# Patient Record
Sex: Female | Born: 1955 | Race: Black or African American | Hispanic: No | Marital: Single | Smoking: Former smoker
Health system: Southern US, Community
[De-identification: ages and names within clinical notes are randomized; demographics above are authoritative.]

## PROBLEM LIST (undated history)

## (undated) DIAGNOSIS — I10 Essential (primary) hypertension: Secondary | ICD-10-CM

## (undated) DIAGNOSIS — E079 Disorder of thyroid, unspecified: Secondary | ICD-10-CM

## (undated) DIAGNOSIS — E785 Hyperlipidemia, unspecified: Secondary | ICD-10-CM

## (undated) DIAGNOSIS — E119 Type 2 diabetes mellitus without complications: Secondary | ICD-10-CM

## (undated) DIAGNOSIS — M1990 Unspecified osteoarthritis, unspecified site: Secondary | ICD-10-CM

## (undated) DIAGNOSIS — E0590 Thyrotoxicosis, unspecified without thyrotoxic crisis or storm: Secondary | ICD-10-CM

## (undated) HISTORY — DX: Disorder of thyroid, unspecified: E07.9

## (undated) HISTORY — DX: Essential (primary) hypertension: I10

## (undated) HISTORY — DX: Hyperlipidemia, unspecified: E78.5

## (undated) HISTORY — PX: ABDOMINAL HYSTERECTOMY: SHX81

## (undated) HISTORY — PX: PARTIAL HYSTERECTOMY: SHX80

---

## 2008-03-29 ENCOUNTER — Emergency Department: Payer: Self-pay | Admitting: Emergency Medicine

## 2008-05-28 ENCOUNTER — Ambulatory Visit: Payer: Self-pay

## 2008-11-17 ENCOUNTER — Ambulatory Visit: Payer: Self-pay | Admitting: Family Medicine

## 2008-11-21 ENCOUNTER — Ambulatory Visit: Payer: Self-pay | Admitting: Family Medicine

## 2011-09-28 DIAGNOSIS — E0500 Thyrotoxicosis with diffuse goiter without thyrotoxic crisis or storm: Secondary | ICD-10-CM | POA: Insufficient documentation

## 2011-12-27 LAB — HM MAMMOGRAPHY: HM MAMMO: NORMAL

## 2011-12-29 ENCOUNTER — Ambulatory Visit: Payer: Self-pay | Admitting: Family

## 2013-04-12 ENCOUNTER — Ambulatory Visit: Payer: Self-pay | Admitting: Family Medicine

## 2013-04-12 LAB — CBC WITH DIFFERENTIAL/PLATELET
BASOS ABS: 0 10*3/uL (ref 0.0–0.1)
BASOS PCT: 0.6 %
EOS ABS: 0.1 10*3/uL (ref 0.0–0.7)
EOS PCT: 1.6 %
HCT: 38.5 % (ref 35.0–47.0)
HGB: 12.4 g/dL (ref 12.0–16.0)
LYMPHS ABS: 2.4 10*3/uL (ref 1.0–3.6)
LYMPHS PCT: 32 %
MCH: 25.8 pg — ABNORMAL LOW (ref 26.0–34.0)
MCHC: 32.3 g/dL (ref 32.0–36.0)
MCV: 80 fL (ref 80–100)
Monocyte #: 0.6 x10 3/mm (ref 0.2–0.9)
Monocyte %: 8 %
Neutrophil #: 4.3 10*3/uL (ref 1.4–6.5)
Neutrophil %: 57.8 %
Platelet: 154 10*3/uL (ref 150–440)
RBC: 4.81 10*6/uL (ref 3.80–5.20)
RDW: 13.5 % (ref 11.5–14.5)
WBC: 7.4 10*3/uL (ref 3.6–11.0)

## 2013-04-12 LAB — URINALYSIS, COMPLETE
Bilirubin,UR: NEGATIVE
Blood: NEGATIVE
GLUCOSE, UR: NEGATIVE mg/dL (ref 0–75)
Ketone: NEGATIVE
Leukocyte Esterase: NEGATIVE
Nitrite: NEGATIVE
PH: 7 (ref 4.5–8.0)
PROTEIN: NEGATIVE
RBC,UR: NONE SEEN /HPF (ref 0–5)
SPECIFIC GRAVITY: 1.005 (ref 1.003–1.030)

## 2013-04-12 LAB — COMPREHENSIVE METABOLIC PANEL
Albumin: 3.6 g/dL (ref 3.4–5.0)
Alkaline Phosphatase: 154 U/L — ABNORMAL HIGH
Anion Gap: 9 (ref 7–16)
BUN: 9 mg/dL (ref 7–18)
Bilirubin,Total: 0.6 mg/dL (ref 0.2–1.0)
CALCIUM: 10 mg/dL (ref 8.5–10.1)
CO2: 27 mmol/L (ref 21–32)
Chloride: 104 mmol/L (ref 98–107)
Creatinine: 0.53 mg/dL — ABNORMAL LOW (ref 0.60–1.30)
EGFR (Non-African Amer.): >60
GLUCOSE: 159 mg/dL — AB (ref 65–99)
Osmolality: 281 (ref 275–301)
Potassium: 4 mmol/L (ref 3.5–5.1)
SGOT(AST): 19 U/L (ref 15–37)
SGPT (ALT): 25 U/L (ref 12–78)
SODIUM: 140 mmol/L (ref 136–145)
Total Protein: 7.7 g/dL (ref 6.4–8.2)

## 2013-04-12 LAB — T4, FREE: FREE THYROXINE: 5.28 ng/dL — AB (ref 0.76–1.46)

## 2013-04-12 LAB — TSH: Thyroid Stimulating Horm: <0.01 u[IU]/mL — ABNORMAL LOW

## 2013-11-29 LAB — TSH: TSH: 0.29 u[IU]/mL — AB (ref <=5.90)

## 2013-11-29 LAB — LIPID PANEL
Cholesterol: 212 mg/dL — AB (ref 0–200)
HDL: 34 mg/dL — AB (ref 35–70)
LDL Cholesterol: 151 mg/dL
TRIGLYCERIDES: 135 mg/dL (ref 40–160)

## 2014-03-14 LAB — BASIC METABOLIC PANEL
BUN: 15 mg/dL (ref 4–21)
CREATININE: 0.9 mg/dL (ref <=1.1)

## 2014-03-14 LAB — HEMOGLOBIN A1C: Hgb A1c MFr Bld: 7 % — AB (ref 4.0–6.0)

## 2014-04-21 ENCOUNTER — Ambulatory Visit: Payer: Self-pay | Admitting: Physician Assistant

## 2014-08-10 ENCOUNTER — Encounter: Payer: Self-pay | Admitting: Internal Medicine

## 2014-08-10 DIAGNOSIS — E0590 Thyrotoxicosis, unspecified without thyrotoxic crisis or storm: Secondary | ICD-10-CM | POA: Insufficient documentation

## 2014-08-10 DIAGNOSIS — E1165 Type 2 diabetes mellitus with hyperglycemia: Secondary | ICD-10-CM

## 2014-08-10 DIAGNOSIS — M7520 Bicipital tendinitis, unspecified shoulder: Secondary | ICD-10-CM | POA: Insufficient documentation

## 2014-08-10 DIAGNOSIS — I10 Essential (primary) hypertension: Secondary | ICD-10-CM | POA: Insufficient documentation

## 2014-08-10 DIAGNOSIS — IMO0001 Reserved for inherently not codable concepts without codable children: Secondary | ICD-10-CM | POA: Insufficient documentation

## 2014-12-05 ENCOUNTER — Encounter: Payer: Self-pay | Admitting: Internal Medicine

## 2014-12-16 ENCOUNTER — Other Ambulatory Visit: Payer: Self-pay | Admitting: Internal Medicine

## 2015-03-04 ENCOUNTER — Encounter: Payer: Self-pay | Admitting: Internal Medicine

## 2015-03-04 ENCOUNTER — Ambulatory Visit (INDEPENDENT_AMBULATORY_CARE_PROVIDER_SITE_OTHER): Payer: PRIVATE HEALTH INSURANCE | Admitting: Internal Medicine

## 2015-03-04 ENCOUNTER — Ambulatory Visit
Admission: RE | Admit: 2015-03-04 | Discharge: 2015-03-04 | Disposition: A | Discharge status: Home / Self Care | Payer: PRIVATE HEALTH INSURANCE | Source: Ambulatory Visit | Attending: Internal Medicine | Admitting: Internal Medicine

## 2015-03-04 VITALS — BP 148/88 | HR 76 | Ht 64.5 in | Wt 184.8 lb

## 2015-03-04 DIAGNOSIS — IMO0001 Reserved for inherently not codable concepts without codable children: Secondary | ICD-10-CM

## 2015-03-04 DIAGNOSIS — Z114 Encounter for screening for human immunodeficiency virus [HIV]: Secondary | ICD-10-CM

## 2015-03-04 DIAGNOSIS — Z1231 Encounter for screening mammogram for malignant neoplasm of breast: Secondary | ICD-10-CM | POA: Insufficient documentation

## 2015-03-04 DIAGNOSIS — I10 Essential (primary) hypertension: Secondary | ICD-10-CM

## 2015-03-04 DIAGNOSIS — E1165 Type 2 diabetes mellitus with hyperglycemia: Secondary | ICD-10-CM

## 2015-03-04 DIAGNOSIS — Z1159 Encounter for screening for other viral diseases: Secondary | ICD-10-CM | POA: Diagnosis not present

## 2015-03-04 DIAGNOSIS — E0590 Thyrotoxicosis, unspecified without thyrotoxic crisis or storm: Secondary | ICD-10-CM | POA: Diagnosis not present

## 2015-03-04 DIAGNOSIS — Z0000 Encounter for general adult medical examination without abnormal findings: Secondary | ICD-10-CM

## 2015-03-04 LAB — POCT URINALYSIS DIPSTICK
Bilirubin, UA: NEGATIVE
Blood, UA: NEGATIVE
Ketones, UA: NEGATIVE
LEUKOCYTES UA: NEGATIVE
NITRITE UA: NEGATIVE
PROTEIN UA: NEGATIVE
Spec Grav, UA: 1.01
UROBILINOGEN UA: 0.2
pH, UA: 5

## 2015-03-04 NOTE — Progress Notes (Signed)
Date:  03/04/2015   Name:  Sarah [name]   DOB:  1955/09/23   MRN:  [id number]   Chief Complaint: Annual Exam; Hypothyroidism; Hypertension; and Diabetes Sarah [name] is a 59 y.o. female who presents today for her Complete Annual Exam. She feels fairly well. She reports exercising none. She reports she is sleeping fairly well. She denies breast problems.  Hypertension This is a chronic problem. The current episode started more than 1 year ago. The problem is unchanged. The problem is controlled. Pertinent negatives include no chest pain, headaches, palpitations or shortness of breath. Risk factors for coronary artery disease include diabetes mellitus and dyslipidemia. Past treatments include angiotensin blockers and beta blockers. The current treatment provides significant improvement. Hypertensive end-organ damage includes a thyroid problem.  Diabetes She presents for her follow-up diabetic visit. She has type 2 diabetes mellitus. Her disease course has been fluctuating. There are no hypoglycemic associated symptoms. Pertinent negatives for hypoglycemia include no dizziness or headaches. Associated symptoms include weight loss. Pertinent negatives for diabetes include no chest pain, no fatigue, no polydipsia and no polyuria. When asked about current treatments, none were reported. She is compliant with treatment none of the time (quit metformin several months because of dizziness). When asked about meal planning, she reported none. Her breakfast blood glucose is taken between 7-8 am. Her breakfast blood glucose range is generally 140-180 mg/dl. An ACE inhibitor/angiotensin II receptor blocker is being taken. Eye exam is current.  Thyroid Problem Presents for follow-up visit. Symptoms include weight loss. Patient reports no depressed mood, diaphoresis, diarrhea, fatigue, hair loss, heat intolerance, hoarse voice, leg swelling or palpitations. The symptoms have been stable. The treatment  provided significant relief.     Review of Systems  Constitutional: Positive for weight loss and unexpected weight change (5 lbs without much effort). Negative for fever, diaphoresis and fatigue.  HENT: Negative for ear pain, hearing loss, hoarse voice, tinnitus, trouble swallowing and voice change.   Eyes: Negative for visual disturbance.  Respiratory: Negative for cough, shortness of breath and wheezing.   Cardiovascular: Negative for chest pain, palpitations and leg swelling.  Gastrointestinal: Negative for abdominal pain, diarrhea and blood in stool.  Endocrine: Negative for heat intolerance, polydipsia and polyuria.  Genitourinary: Negative for dysuria, hematuria, vaginal bleeding, vaginal discharge and vaginal pain.  Musculoskeletal: Positive for back pain.  Skin: Negative for color change and rash.  Allergic/Immunologic: Negative for environmental allergies.  Neurological: Negative for dizziness, syncope and headaches.  Hematological: Negative for adenopathy.  Psychiatric/Behavioral: Negative for sleep disturbance and dysphoric mood.    Patient Active Problem List   Diagnosis Date Noted  . Biceps tendinitis 08/10/2014  . Essential (primary) hypertension 08/10/2014  . Hyperthyroidism 08/10/2014  . Diabetes mellitus type 2, uncontrolled ([place]) 08/10/2014    Prior to Admission medications   Medication Sig Start Date End Date Taking? Authorizing Provider  irbesartan (AVAPRO) 300 MG tablet Take 300 mg by mouth daily. 08/08/14  Yes Historical Provider, MD  methimazole (TAPAZOLE) 10 MG tablet TAKE ONE TABLET BY MOUTH ONCE DAILY 12/16/14  Yes Glean Hess, MD  metoprolol tartrate (LOPRESSOR) 25 MG tablet TAKE ONE TABLET BY MOUTH TWICE DAILY FOR HIGH BLOOD PRESSURE 12/16/14  Yes Glean Hess, MD  metFORMIN (GLUCOPHAGE) 500 MG tablet Take 1 tablet by mouth 2 (two) times daily. 04/16/14   Historical Provider, MD    Allergies  Allergen Reactions  . Ace Inhibitors Nausea Only     Past Surgical History  Procedure Laterality  Date  . Partial hysterectomy      Social History  Substance Use Topics  . Smoking status: Former Research scientist (life sciences)  . Smokeless tobacco: None  . Alcohol Use: No    Medication list has been reviewed and updated.   Physical Exam  Constitutional: She is oriented to person, place, and time. She appears well-developed and well-nourished. No distress.  HENT:  Head: Normocephalic and atraumatic.  Right Ear: Tympanic membrane and ear canal normal.  Left Ear: Tympanic membrane and ear canal normal.  Nose: Right sinus exhibits no maxillary sinus tenderness. Left sinus exhibits no maxillary sinus tenderness.  Mouth/Throat: Uvula is midline and oropharynx is clear and moist.  Eyes: Conjunctivae and EOM are normal. Right eye exhibits no discharge. Left eye exhibits no discharge. No scleral icterus.  Neck: Normal range of motion. Carotid bruit is not present. No erythema present. Thyromegaly: right lobe enlarged; non tender.  Cardiovascular: Normal rate, regular rhythm, normal heart sounds and normal pulses.   Pulses:      Dorsalis pedis pulses are 2+ on the right side, and 2+ on the left side.       Posterior tibial pulses are 2+ on the right side, and 2+ on the left side.  Pulmonary/Chest: Effort normal and breath sounds normal. No respiratory distress. She has no wheezes. She has no rhonchi. Right breast exhibits no mass, no nipple discharge, no skin change and no tenderness. Left breast exhibits no mass, no nipple discharge, no skin change and no tenderness.  Abdominal: Soft. Bowel sounds are normal. There is no hepatosplenomegaly. There is no tenderness. There is no CVA tenderness.  Musculoskeletal: Normal range of motion. She exhibits no edema or tenderness.  Lymphadenopathy:    She has no cervical adenopathy.    She has no axillary adenopathy.  Neurological: She is alert and oriented to person, place, and time. She has normal reflexes. No cranial  nerve deficit or sensory deficit.  Foot exam - normal skin, pulses and sensation.  Several nails thickened and curved.  Skin: Skin is warm, dry and intact. No rash noted.  Psychiatric: She has a normal mood and affect. Her speech is normal and behavior is normal. Thought content normal.  Nursing note and vitals reviewed.   BP 148/88 mmHg  Pulse 76  Ht 5' 4.5" (1.638 m)  Wt 184 lb 12.8 oz (83.825 kg)  BMI 31.24 kg/m2  Assessment and Plan: 1. Annual physical exam Patient is encouraged to continue healthy diet and begin regular exercise 30 minutes 3 times a week - POCT urinalysis dipstick  2. Essential (primary) hypertension Slightly elevated today with previously normal readings Continue current regimen and recheck next visit - CBC with Differential/Platelet  3. Uncontrolled type 2 diabetes mellitus without complication, without long-term current use of insulin (HCC) Questionably intolerant to metformin; will check A1c and if needed resume at 250 mg twice a day - Microalbumin / creatinine urine ratio - Comprehensive metabolic panel - Hemoglobin A1c - Lipid panel  4. Need for hepatitis C screening test - Hepatitis C antibody  5. Encounter for screening for HIV - HIV antibody  6. Hyperthyroidism Asymptomatic with enlargement of the right lobe Adjust medication dose if needed - Thyroid Panel With TSH  7. Encounter for screening mammogram for breast cancer - MM DIGITAL SCREENING BILATERAL; Future   Halina Maidens, MD [facility]  03/04/2015

## 2015-03-04 NOTE — Patient Instructions (Signed)
DASH Eating Plan  DASH stands for "Dietary Approaches to Stop Hypertension." The DASH eating plan is a healthy eating plan that has been shown to reduce high blood pressure (hypertension). Additional health benefits may include reducing the risk of type 2 diabetes mellitus, heart disease, and stroke. The DASH eating plan may also help with weight loss.  WHAT DO I NEED TO KNOW ABOUT THE DASH EATING PLAN?  For the DASH eating plan, you will follow these general guidelines:  · Choose foods with a percent daily value for sodium of less than 5% (as listed on the food label).  · Use salt-free seasonings or herbs instead of table salt or sea salt.  · Check with your health care provider or pharmacist before using salt substitutes.  · Eat lower-sodium products, often labeled as "lower sodium" or "no salt added."  · Eat fresh foods.  · Eat more vegetables, fruits, and low-fat dairy products.  · Choose whole grains. Look for the word "whole" as the first word in the ingredient list.  · Choose fish and skinless chicken or turkey more often than red meat. Limit fish, poultry, and meat to 6 oz (170 g) each day.  · Limit sweets, desserts, sugars, and sugary drinks.  · Choose heart-healthy fats.  · Limit cheese to 1 oz (28 g) per day.  · Eat more home-cooked food and less restaurant, buffet, and fast food.  · Limit fried foods.  · Cook foods using methods other than frying.  · Limit canned vegetables. If you do use them, rinse them well to decrease the sodium.  · When eating at a restaurant, ask that your food be prepared with less salt, or no salt if possible.  WHAT FOODS CAN I EAT?  Seek help from a dietitian for individual calorie needs.  Grains  Whole grain or whole wheat bread. Brown rice. Whole grain or whole wheat pasta. Quinoa, bulgur, and whole grain cereals. Low-sodium cereals. Corn or whole wheat flour tortillas. Whole grain cornbread. Whole grain crackers. Low-sodium crackers.  Vegetables  Fresh or frozen vegetables  (raw, steamed, roasted, or grilled). Low-sodium or reduced-sodium tomato and vegetable juices. Low-sodium or reduced-sodium tomato sauce and paste. Low-sodium or reduced-sodium canned vegetables.   Fruits  All fresh, canned (in natural juice), or frozen fruits.  Meat and Other Protein Products  Ground beef (85% or leaner), grass-fed beef, or beef trimmed of fat. Skinless chicken or turkey. Ground chicken or turkey. Pork trimmed of fat. All fish and seafood. Eggs. Dried beans, peas, or lentils. Unsalted nuts and seeds. Unsalted canned beans.  Dairy  Low-fat dairy products, such as skim or 1% milk, 2% or reduced-fat cheeses, low-fat ricotta or cottage cheese, or plain low-fat yogurt. Low-sodium or reduced-sodium cheeses.  Fats and Oils  Tub margarines without trans fats. Light or reduced-fat mayonnaise and salad dressings (reduced sodium). Avocado. Safflower, olive, or canola oils. Natural peanut or almond butter.  Other  Unsalted popcorn and pretzels.  The items listed above may not be a complete list of recommended foods or beverages. Contact your dietitian for more options.  WHAT FOODS ARE NOT RECOMMENDED?  Grains  White bread. White pasta. White rice. Refined cornbread. Bagels and croissants. Crackers that contain trans fat.  Vegetables  Creamed or fried vegetables. Vegetables in a cheese sauce. Regular canned vegetables. Regular canned tomato sauce and paste. Regular tomato and vegetable juices.  Fruits  Dried fruits. Canned fruit in light or heavy syrup. Fruit juice.  Meat and Other Protein   Products  Fatty cuts of meat. Ribs, chicken wings, bacon, sausage, bologna, salami, chitterlings, fatback, hot dogs, bratwurst, and packaged luncheon meats. Salted nuts and seeds. Canned beans with salt.  Dairy  Whole or 2% milk, cream, half-and-half, and cream cheese. Whole-fat or sweetened yogurt. Full-fat cheeses or blue cheese. Nondairy creamers and whipped toppings. Processed cheese, cheese spreads, or cheese  curds.  Condiments  Onion and garlic salt, seasoned salt, table salt, and sea salt. Canned and packaged gravies. Worcestershire sauce. Tartar sauce. Barbecue sauce. Teriyaki sauce. Soy sauce, including reduced sodium. Steak sauce. Fish sauce. Oyster sauce. Cocktail sauce. Horseradish. Ketchup and mustard. Meat flavorings and tenderizers. Bouillon cubes. Hot sauce. Tabasco sauce. Marinades. Taco seasonings. Relishes.  Fats and Oils  Butter, stick margarine, lard, shortening, ghee, and bacon fat. Coconut, palm kernel, or palm oils. Regular salad dressings.  Other  Pickles and olives. Salted popcorn and pretzels.  The items listed above may not be a complete list of foods and beverages to avoid. Contact your dietitian for more information.  WHERE CAN I FIND MORE INFORMATION?  National Heart, Lung, and Blood Institute: www.nhlbi.nih.gov/health/health-topics/topics/dash/     This information is not intended to replace advice given to you by your health care provider. Make sure you discuss any questions you have with your health care provider.     Document Released: 03/03/2011 Document Revised: 04/04/2014 Document Reviewed: 01/16/2013  Elsevier Interactive Patient Education ©2016 Elsevier Inc.

## 2015-03-05 ENCOUNTER — Other Ambulatory Visit: Payer: Self-pay | Admitting: Internal Medicine

## 2015-03-05 DIAGNOSIS — E1169 Type 2 diabetes mellitus with other specified complication: Secondary | ICD-10-CM | POA: Insufficient documentation

## 2015-03-05 DIAGNOSIS — E785 Hyperlipidemia, unspecified: Principal | ICD-10-CM

## 2015-03-05 LAB — LIPID PANEL
CHOL/HDL RATIO: 7.3 ratio — AB (ref 0.0–4.4)
CHOLESTEROL TOTAL: 261 mg/dL — AB (ref 100–199)
HDL: 36 mg/dL — ABNORMAL LOW (ref >39)
LDL CALC: 178 mg/dL — AB (ref 0–99)
TRIGLYCERIDES: 236 mg/dL — AB (ref 0–149)
VLDL CHOLESTEROL CAL: 47 mg/dL — AB (ref 5–40)

## 2015-03-05 LAB — CBC WITH DIFFERENTIAL/PLATELET
BASOS: 1 %
Basophils Absolute: 0.1 10*3/uL (ref 0.0–0.2)
EOS (ABSOLUTE): 0.2 10*3/uL (ref 0.0–0.4)
EOS: 2 %
HEMOGLOBIN: 14.5 g/dL (ref 11.1–15.9)
Hematocrit: 44.7 % (ref 34.0–46.6)
IMMATURE GRANS (ABS): 0 10*3/uL (ref 0.0–0.1)
Immature Granulocytes: 0 %
LYMPHS: 38 %
Lymphocytes Absolute: 3.9 10*3/uL — ABNORMAL HIGH (ref 0.7–3.1)
MCH: 28.8 pg (ref 26.6–33.0)
MCHC: 32.4 g/dL (ref 31.5–35.7)
MCV: 89 fL (ref 79–97)
Monocytes Absolute: 0.4 10*3/uL (ref 0.1–0.9)
Monocytes: 4 %
Neutrophils Absolute: 5.7 10*3/uL (ref 1.4–7.0)
Neutrophils: 55 %
Platelets: 180 10*3/uL (ref 150–379)
RBC: 5.03 x10E6/uL (ref 3.77–5.28)
RDW: 13.5 % (ref 12.3–15.4)
WBC: 10.3 10*3/uL (ref 3.4–10.8)

## 2015-03-05 LAB — COMPREHENSIVE METABOLIC PANEL
A/G RATIO: 1.5 (ref 1.1–2.5)
ALBUMIN: 4.8 g/dL (ref 3.5–5.5)
ALT: 15 IU/L (ref 0–32)
AST: 18 IU/L (ref 0–40)
Alkaline Phosphatase: 128 IU/L — ABNORMAL HIGH (ref 39–117)
BUN / CREAT RATIO: 16 (ref 9–23)
BUN: 13 mg/dL (ref 6–24)
Bilirubin Total: 0.6 mg/dL (ref 0.0–1.2)
CALCIUM: 10 mg/dL (ref 8.7–10.2)
CO2: 24 mmol/L (ref 18–29)
Chloride: 99 mmol/L (ref 97–106)
Creatinine, Ser: 0.82 mg/dL (ref 0.57–1.00)
GFR, EST AFRICAN AMERICAN: 91 mL/min/{1.73_m2} (ref >59)
GFR, EST NON AFRICAN AMERICAN: 79 mL/min/{1.73_m2} (ref >59)
Globulin, Total: 3.2 g/dL (ref 1.5–4.5)
Glucose: 362 mg/dL — ABNORMAL HIGH (ref 65–99)
Potassium: 4.2 mmol/L (ref 3.5–5.2)
Sodium: 140 mmol/L (ref 136–144)
TOTAL PROTEIN: 8 g/dL (ref 6.0–8.5)

## 2015-03-05 LAB — HIV ANTIBODY (ROUTINE TESTING W REFLEX): HIV Screen 4th Generation wRfx: NONREACTIVE

## 2015-03-05 LAB — HEPATITIS C ANTIBODY: Hep C Virus Ab: <0.1 s/co ratio (ref 0.0–0.9)

## 2015-03-05 LAB — MICROALBUMIN / CREATININE URINE RATIO
Creatinine, Urine: 71.5 mg/dL
MICROALB/CREAT RATIO: 32.2 mg/g creat — ABNORMAL HIGH (ref 0.0–30.0)
Microalbumin, Urine: 23 ug/mL

## 2015-03-05 LAB — THYROID PANEL WITH TSH
FREE THYROXINE INDEX: 1.6 (ref 1.2–4.9)
T3 Uptake Ratio: 25 % (ref 24–39)
T4, Total: 6.5 ug/dL (ref 4.5–12.0)
TSH: 2.87 u[IU]/mL (ref 0.450–4.500)

## 2015-03-05 LAB — HEMOGLOBIN A1C
Est. average glucose Bld gHb Est-mCnc: 332 mg/dL
Hgb A1c MFr Bld: 13.2 % — ABNORMAL HIGH (ref 4.8–5.6)

## 2015-03-05 MED ORDER — METFORMIN HCL 500 MG PO TABS: 250.0000 mg | ORAL_TABLET | Freq: Two times a day (BID) | ORAL | Status: DC

## 2015-03-20 ENCOUNTER — Other Ambulatory Visit: Payer: Self-pay | Admitting: Internal Medicine

## 2015-04-01 ENCOUNTER — Other Ambulatory Visit: Payer: Self-pay | Admitting: Internal Medicine

## 2015-07-03 ENCOUNTER — Encounter: Payer: Self-pay | Admitting: Internal Medicine

## 2015-07-03 ENCOUNTER — Ambulatory Visit (INDEPENDENT_AMBULATORY_CARE_PROVIDER_SITE_OTHER): Payer: PRIVATE HEALTH INSURANCE | Admitting: Internal Medicine

## 2015-07-03 VITALS — BP 144/84 | HR 68 | Ht 64.5 in | Wt 184.4 lb

## 2015-07-03 DIAGNOSIS — I10 Essential (primary) hypertension: Secondary | ICD-10-CM | POA: Diagnosis not present

## 2015-07-03 DIAGNOSIS — E1165 Type 2 diabetes mellitus with hyperglycemia: Secondary | ICD-10-CM

## 2015-07-03 DIAGNOSIS — IMO0001 Reserved for inherently not codable concepts without codable children: Secondary | ICD-10-CM

## 2015-07-03 DIAGNOSIS — J3089 Other allergic rhinitis: Secondary | ICD-10-CM | POA: Diagnosis not present

## 2015-07-03 NOTE — Progress Notes (Signed)
Date:  07/03/2015   Name:  Sarah [name]   DOB:  1955-09-05   MRN:  [id number]   Chief Complaint: Diabetes and Hypertension Diabetes She presents for her follow-up diabetic visit. She has type 2 diabetes mellitus. Her disease course has been worsening. Pertinent negatives for hypoglycemia include no headaches. Pertinent negatives for diabetes include no chest pain, no fatigue, no polydipsia and no polyuria. Symptoms are worsening (restarted low dose metformin last visit). Her breakfast blood glucose is taken between 7-8 am. Her breakfast blood glucose range is generally 140-180 mg/dl.  Hypertension This is a chronic problem. The current episode started more than 1 year ago. The problem is unchanged. The problem is controlled. Pertinent negatives include no chest pain, headaches or shortness of breath. Past treatments include angiotensin blockers and beta blockers. The current treatment provides moderate improvement.    Lab Results  Component Value Date   HGBA1C 13.2* 03/04/2015     Review of Systems  Constitutional: Negative for chills and fatigue.  HENT: Positive for congestion, rhinorrhea and sinus pressure.   Eyes: Positive for itching.  Respiratory: Negative for cough, chest tightness and shortness of breath.   Cardiovascular: Negative for chest pain and leg swelling.  Endocrine: Negative for polydipsia and polyuria.  Musculoskeletal: Negative for arthralgias.  Allergic/Immunologic: Positive for environmental allergies.  Neurological: Negative for headaches.  Psychiatric/Behavioral: Negative for sleep disturbance and dysphoric mood.    Patient Active Problem List   Diagnosis Date Noted  . Hyperlipidemia associated with type 2 diabetes mellitus ([place]) 03/05/2015  . Biceps tendinitis 08/10/2014  . Essential (primary) hypertension 08/10/2014  . Hyperthyroidism 08/10/2014  . Diabetes mellitus type 2, uncontrolled ([place]) 08/10/2014    Prior to Admission medications     Medication Sig Start Date End Date Taking? Authorizing Provider  irbesartan (AVAPRO) 300 MG tablet Take 300 mg by mouth daily. 08/08/14   Historical Provider, MD  metFORMIN (GLUCOPHAGE) 500 MG tablet Take 0.5 tablets (250 mg total) by mouth 2 (two) times daily. 03/05/15   Glean Hess, MD  methimazole (TAPAZOLE) 10 MG tablet TAKE ONE TABLET BY MOUTH ONCE DAILY 03/20/15   Glean Hess, MD  metoprolol tartrate (LOPRESSOR) 25 MG tablet TAKE ONE TABLET BY MOUTH TWICE DAILY FOR BLOOD PRESSURE 04/01/15   Glean Hess, MD    Allergies  Allergen Reactions  . Ace Inhibitors Nausea Only    Past Surgical History  Procedure Laterality Date  . Partial hysterectomy    . Abdominal hysterectomy      Social History  Substance Use Topics  . Smoking status: Former Research scientist (life sciences)  . Smokeless tobacco: None  . Alcohol Use: No     Medication list has been reviewed and updated.   Physical Exam  Constitutional: She is oriented to person, place, and time. She appears well-developed. No distress.  HENT:  Head: Normocephalic and atraumatic.  Eyes:  Excessive tearing - otherwise clear  Neck: Thyroid mass (right sided enlargement stable, non tender) present.  Cardiovascular: Normal rate, regular rhythm, normal heart sounds and intact distal pulses.   Pulmonary/Chest: Effort normal and breath sounds normal. No respiratory distress.  Musculoskeletal: Normal range of motion.  Neurological: She is alert and oriented to person, place, and time.  Skin: Skin is warm and dry. No rash noted.  Psychiatric: She has a normal mood and affect. Her behavior is normal. Thought content normal.  Nursing note and vitals reviewed.   BP 164/88 mmHg  Pulse 68  Ht 5' 4.5" (  1.638 m)  Wt 184 lb 6.4 oz (83.643 kg)  BMI 31.17 kg/m2  Assessment and Plan: 1. Essential (primary) hypertension Fair control - pt unable to afford irbesartan Will recheck next visit and consider other medication  2. Uncontrolled type 2  diabetes mellitus without complication, without long-term current use of insulin (HCC) Continue metformin 250 mg bid Will probably need to add another agent - Hemoglobin A1c  3. Environmental and seasonal allergies claritin 10 mg qd prn   Halina Maidens, MD [facility]  07/03/2015

## 2015-07-03 NOTE — Patient Instructions (Signed)
Try [employer] brand Equate - Loratidine 10 mg - take once a day

## 2015-07-04 LAB — HEMOGLOBIN A1C
Est. average glucose Bld gHb Est-mCnc: 298 mg/dL
HEMOGLOBIN A1C: 12 % — AB (ref 4.8–5.6)

## 2015-07-06 ENCOUNTER — Telehealth: Payer: Self-pay

## 2015-07-06 NOTE — Telephone Encounter (Signed)
Tried calling patient and no answer. Will try again later.  

## 2015-07-06 NOTE — Telephone Encounter (Signed)
-----   Message from Glean Hess, MD sent at 07/06/2015  8:07 AM EDT ----- Diabetes is only slightly better.  Need to add another medication - Farxiga 10 mg.  I have a savings card for you to pick up.

## 2015-07-08 NOTE — Telephone Encounter (Signed)
Please review or send to Dr. Sharm[employer] nurse. Unable to contact before I left. Thanks!

## 2015-07-08 NOTE — Telephone Encounter (Signed)
Tried calling patient and no answer. Will try again later.  

## 2015-07-14 ENCOUNTER — Other Ambulatory Visit: Payer: Self-pay | Admitting: Internal Medicine

## 2015-07-14 MED ORDER — DAPAGLIFLOZIN PROPANEDIOL 10 MG PO TABS: 10.0000 mg | ORAL_TABLET | Freq: Every day | ORAL | Status: DC

## 2015-07-14 MED ORDER — METFORMIN HCL 500 MG PO TABS: 250.0000 mg | ORAL_TABLET | Freq: Two times a day (BID) | ORAL | Status: DC

## 2015-07-14 NOTE — Telephone Encounter (Signed)
Spoke with patient. Patient advised of all results and verbalized understanding. Will call back with any future questions or concerns. MAH  

## 2015-07-14 NOTE — Telephone Encounter (Signed)
Tried calling patient.  No answer.  Will try again

## 2015-07-27 ENCOUNTER — Other Ambulatory Visit: Payer: Self-pay | Admitting: Internal Medicine

## 2015-11-05 ENCOUNTER — Encounter: Payer: Self-pay | Admitting: Internal Medicine

## 2015-11-05 ENCOUNTER — Ambulatory Visit (INDEPENDENT_AMBULATORY_CARE_PROVIDER_SITE_OTHER): Payer: PRIVATE HEALTH INSURANCE | Admitting: Internal Medicine

## 2015-11-05 VITALS — BP 136/84 | HR 73 | Resp 16 | Ht 64.5 in | Wt 182.0 lb

## 2015-11-05 DIAGNOSIS — L84 Corns and callosities: Secondary | ICD-10-CM

## 2015-11-05 DIAGNOSIS — I10 Essential (primary) hypertension: Secondary | ICD-10-CM

## 2015-11-05 DIAGNOSIS — E1165 Type 2 diabetes mellitus with hyperglycemia: Secondary | ICD-10-CM | POA: Diagnosis not present

## 2015-11-05 DIAGNOSIS — IMO0001 Reserved for inherently not codable concepts without codable children: Secondary | ICD-10-CM

## 2015-11-05 MED ORDER — GLIMEPIRIDE 2 MG PO TABS: 2.0000 mg | ORAL_TABLET | Freq: Every day | ORAL | 3 refills | Status: DC

## 2015-11-05 NOTE — Patient Instructions (Signed)
DASH Eating Plan  DASH stands for "Dietary Approaches to Stop Hypertension." The DASH eating plan is a healthy eating plan that has been shown to reduce high blood pressure (hypertension). Additional health benefits may include reducing the risk of type 2 diabetes mellitus, heart disease, and stroke. The DASH eating plan may also help with weight loss.  WHAT DO I NEED TO KNOW ABOUT THE DASH EATING PLAN?  For the DASH eating plan, you will follow these general guidelines:  · Choose foods with a percent daily value for sodium of less than 5% (as listed on the food label).  · Use salt-free seasonings or herbs instead of table salt or sea salt.  · Check with your health care provider or pharmacist before using salt substitutes.  · Eat lower-sodium products, often labeled as "lower sodium" or "no salt added."  · Eat fresh foods.  · Eat more vegetables, fruits, and low-fat dairy products.  · Choose whole grains. Look for the word "whole" as the first word in the ingredient list.  · Choose fish and skinless chicken or turkey more often than red meat. Limit fish, poultry, and meat to 6 oz (170 g) each day.  · Limit sweets, desserts, sugars, and sugary drinks.  · Choose heart-healthy fats.  · Limit cheese to 1 oz (28 g) per day.  · Eat more home-cooked food and less restaurant, buffet, and fast food.  · Limit fried foods.  · Cook foods using methods other than frying.  · Limit canned vegetables. If you do use them, rinse them well to decrease the sodium.  · When eating at a restaurant, ask that your food be prepared with less salt, or no salt if possible.  WHAT FOODS CAN I EAT?  Seek help from a dietitian for individual calorie needs.  Grains  Whole grain or whole wheat bread. Brown rice. Whole grain or whole wheat pasta. Quinoa, bulgur, and whole grain cereals. Low-sodium cereals. Corn or whole wheat flour tortillas. Whole grain cornbread. Whole grain crackers. Low-sodium crackers.  Vegetables  Fresh or frozen vegetables  (raw, steamed, roasted, or grilled). Low-sodium or reduced-sodium tomato and vegetable juices. Low-sodium or reduced-sodium tomato sauce and paste. Low-sodium or reduced-sodium canned vegetables.   Fruits  All fresh, canned (in natural juice), or frozen fruits.  Meat and Other Protein Products  Ground beef (85% or leaner), grass-fed beef, or beef trimmed of fat. Skinless chicken or turkey. Ground chicken or turkey. Pork trimmed of fat. All fish and seafood. Eggs. Dried beans, peas, or lentils. Unsalted nuts and seeds. Unsalted canned beans.  Dairy  Low-fat dairy products, such as skim or 1% milk, 2% or reduced-fat cheeses, low-fat ricotta or cottage cheese, or plain low-fat yogurt. Low-sodium or reduced-sodium cheeses.  Fats and Oils  Tub margarines without trans fats. Light or reduced-fat mayonnaise and salad dressings (reduced sodium). Avocado. Safflower, olive, or canola oils. Natural peanut or almond butter.  Other  Unsalted popcorn and pretzels.  The items listed above may not be a complete list of recommended foods or beverages. Contact your dietitian for more options.  WHAT FOODS ARE NOT RECOMMENDED?  Grains  White bread. White pasta. White rice. Refined cornbread. Bagels and croissants. Crackers that contain trans fat.  Vegetables  Creamed or fried vegetables. Vegetables in a cheese sauce. Regular canned vegetables. Regular canned tomato sauce and paste. Regular tomato and vegetable juices.  Fruits  Dried fruits. Canned fruit in light or heavy syrup. Fruit juice.  Meat and Other Protein   Products  Fatty cuts of meat. Ribs, chicken wings, bacon, sausage, bologna, salami, chitterlings, fatback, hot dogs, bratwurst, and packaged luncheon meats. Salted nuts and seeds. Canned beans with salt.  Dairy  Whole or 2% milk, cream, half-and-half, and cream cheese. Whole-fat or sweetened yogurt. Full-fat cheeses or blue cheese. Nondairy creamers and whipped toppings. Processed cheese, cheese spreads, or cheese  curds.  Condiments  Onion and garlic salt, seasoned salt, table salt, and sea salt. Canned and packaged gravies. Worcestershire sauce. Tartar sauce. Barbecue sauce. Teriyaki sauce. Soy sauce, including reduced sodium. Steak sauce. Fish sauce. Oyster sauce. Cocktail sauce. Horseradish. Ketchup and mustard. Meat flavorings and tenderizers. Bouillon cubes. Hot sauce. Tabasco sauce. Marinades. Taco seasonings. Relishes.  Fats and Oils  Butter, stick margarine, lard, shortening, ghee, and bacon fat. Coconut, palm kernel, or palm oils. Regular salad dressings.  Other  Pickles and olives. Salted popcorn and pretzels.  The items listed above may not be a complete list of foods and beverages to avoid. Contact your dietitian for more information.  WHERE CAN I FIND MORE INFORMATION?  National Heart, Lung, and Blood Institute: www.nhlbi.nih.gov/health/health-topics/topics/dash/     This information is not intended to replace advice given to you by your health care provider. Make sure you discuss any questions you have with your health care provider.     Document Released: 03/03/2011 Document Revised: 04/04/2014 Document Reviewed: 01/16/2013  Elsevier Interactive Patient Education ©2016 Elsevier Inc.

## 2015-11-05 NOTE — Progress Notes (Signed)
Date:  11/05/2015   Name:  Sarah [name]   DOB:  [birth year]   MRN:  [id number]   Chief Complaint: Hypertension and Toe Pain (Left foot bump on 4 digit toe) Hypertension  This is a chronic problem. The current episode started more than 1 year ago. The problem has been waxing and waning since onset. The problem is controlled. Pertinent negatives include no anxiety, chest pain, headaches, palpitations or shortness of breath. Risk factors for coronary artery disease include diabetes mellitus and dyslipidemia. Past treatments include beta blockers (no coverage for ARBs and allergic to ACEI).  Toe Pain   The incident occurred more than 1 week ago. There was no injury mechanism. Pain location: between 4th and 5th toes on left foot. Pertinent negatives include no numbness.  Diabetes  She presents for her follow-up diabetic visit. She has type 2 diabetes mellitus. Pertinent negatives for hypoglycemia include no headaches or tremors. Pertinent negatives for diabetes include no chest pain, no fatigue, no foot ulcerations, no polydipsia, no polyuria, no visual change and no weight loss. Current diabetic treatments: metformin; yeast infection with SGLT-2. She is compliant with treatment all of the time. Her weight is stable. She rarely (planning to get serious about excercise and weight loss) participates in exercise. An ACE inhibitor/angiotensin II receptor blocker is contraindicated.   Lab Results  Component Value Date   HGBA1C 12.0 (H) 07/03/2015     Review of Systems  Constitutional: Negative for appetite change, fatigue, fever, unexpected weight change and weight loss.  HENT: Negative for tinnitus and trouble swallowing.   Eyes: Negative for visual disturbance.  Respiratory: Negative for cough, chest tightness and shortness of breath.   Cardiovascular: Negative for chest pain, palpitations and leg swelling.  Gastrointestinal: Negative for abdominal pain.  Endocrine: Negative for  polydipsia and polyuria.  Genitourinary: Negative for dysuria and hematuria.  Musculoskeletal: Negative for arthralgias.  Neurological: Negative for tremors, numbness and headaches.  Psychiatric/Behavioral: Negative for dysphoric mood.    Patient Active Problem List   Diagnosis Date Noted  . Hyperlipidemia associated with type 2 diabetes mellitus ([place]) 03/05/2015  . Biceps tendinitis 08/10/2014  . Essential (primary) hypertension 08/10/2014  . Hyperthyroidism 08/10/2014  . Diabetes mellitus type 2, uncontrolled ([place]) 08/10/2014    Prior to Admission medications   Medication Sig Start Date End Date Taking? Authorizing Provider  irbesartan (AVAPRO) 300 MG tablet Take 300 mg by mouth daily. Reported on 07/03/2015 08/08/14  Yes Historical Provider, MD  loratadine (ALLERGY) 10 MG tablet Take 10 mg by mouth daily.   Yes Historical Provider, MD  metFORMIN (GLUCOPHAGE) 500 MG tablet Take 0.5 tablets (250 mg total) by mouth 2 (two) times daily. 07/14/15  Yes Glean Hess, MD  methimazole (TAPAZOLE) 10 MG tablet TAKE ONE TABLET BY MOUTH ONCE DAILY 07/27/15  Yes Glean Hess, MD  metoprolol tartrate (LOPRESSOR) 25 MG tablet TAKE ONE TABLET BY MOUTH TWICE DAILY FOR BLOOD PRESSURE 04/01/15  Yes Glean Hess, MD  dapagliflozin propanediol (FARXIGA) 10 MG TABS tablet Take 10 mg by mouth daily. Patient not taking: Reported on 11/05/2015 07/14/15   Glean Hess, MD    Allergies  Allergen Reactions  . Ace Inhibitors Nausea Only  . Wilder Glade [Dapagliflozin]     Vaginitis     Past Surgical History:  Procedure Laterality Date  . ABDOMINAL HYSTERECTOMY    . PARTIAL HYSTERECTOMY      Social History  Substance Use Topics  . Smoking status: Former Smoker  Types: E-cigarettes  . Smokeless tobacco: Never Used  . Alcohol use No     Medication list has been reviewed and updated.   Physical Exam  Constitutional: She is oriented to person, place, and time. She appears well-developed. No  distress.  HENT:  Head: Normocephalic and atraumatic.  Cardiovascular: Normal rate, regular rhythm and normal heart sounds.   Pulmonary/Chest: Effort normal and breath sounds normal. No respiratory distress.  Musculoskeletal: Normal range of motion. She exhibits no edema.  Neurological: She is alert and oriented to person, place, and time.  Skin: Skin is warm and dry. No rash noted.  3 mm callus/corn between toes - tender to touch; no drainage  Psychiatric: She has a normal mood and affect. Her behavior is normal. Thought content normal.  Nursing note and vitals reviewed.   BP (!) 143/78 (BP Location: Right Arm, Patient Position: Sitting, Cuff Size: Normal)   Pulse 73   Resp 16   Ht 5' 4.5" (1.638 m)   Wt 182 lb (82.6 kg)   SpO2 100%   BMI 30.76 kg/m   Assessment and Plan: 1. Corn or callus - Ambulatory referral to Podiatry  2. Essential (primary) hypertension Improved on singe agent  3. Uncontrolled type 2 diabetes mellitus without complication, without long-term current use of insulin ([place]) Discussed need to better control - will add the most cost effective medication but need to consider insulin - Hemoglobin A1c - glimepiride (AMARYL) 2 MG tablet; Take 1 tablet (2 mg total) by mouth daily before breakfast.  Dispense: 30 tablet; Refill: [place], MD [facility]  11/05/2015

## 2015-11-06 LAB — HEMOGLOBIN A1C
Est. average glucose Bld gHb Est-mCnc: 266 mg/dL
HEMOGLOBIN A1C: 10.9 % — AB (ref 4.8–5.6)

## 2016-01-25 ENCOUNTER — Other Ambulatory Visit: Payer: Self-pay | Admitting: Internal Medicine

## 2016-01-25 NOTE — Telephone Encounter (Signed)
Thyroid med refill to [facility]

## 2016-03-03 ENCOUNTER — Encounter: Payer: Self-pay | Admitting: Internal Medicine

## 2016-03-04 ENCOUNTER — Other Ambulatory Visit
Admission: RE | Admit: 2016-03-04 | Discharge: 2016-03-04 | Disposition: A | Discharge status: Home / Self Care | Payer: No Typology Code available for payment source | Source: Ambulatory Visit | Attending: Internal Medicine | Admitting: Internal Medicine

## 2016-03-04 ENCOUNTER — Encounter: Payer: Self-pay | Admitting: Internal Medicine

## 2016-03-04 ENCOUNTER — Other Ambulatory Visit: Payer: Self-pay | Admitting: Internal Medicine

## 2016-03-04 ENCOUNTER — Other Ambulatory Visit: Payer: Self-pay

## 2016-03-04 ENCOUNTER — Ambulatory Visit (INDEPENDENT_AMBULATORY_CARE_PROVIDER_SITE_OTHER): Payer: PRIVATE HEALTH INSURANCE | Admitting: Internal Medicine

## 2016-03-04 VITALS — BP 122/82 | HR 74 | Resp 16 | Ht 64.5 in | Wt 186.0 lb

## 2016-03-04 DIAGNOSIS — E1165 Type 2 diabetes mellitus with hyperglycemia: Secondary | ICD-10-CM | POA: Diagnosis not present

## 2016-03-04 DIAGNOSIS — Z1211 Encounter for screening for malignant neoplasm of colon: Secondary | ICD-10-CM

## 2016-03-04 DIAGNOSIS — Z0000 Encounter for general adult medical examination without abnormal findings: Secondary | ICD-10-CM | POA: Diagnosis not present

## 2016-03-04 DIAGNOSIS — E0590 Thyrotoxicosis, unspecified without thyrotoxic crisis or storm: Secondary | ICD-10-CM

## 2016-03-04 DIAGNOSIS — E1169 Type 2 diabetes mellitus with other specified complication: Secondary | ICD-10-CM | POA: Diagnosis not present

## 2016-03-04 DIAGNOSIS — Z1231 Encounter for screening mammogram for malignant neoplasm of breast: Secondary | ICD-10-CM | POA: Diagnosis not present

## 2016-03-04 DIAGNOSIS — E785 Hyperlipidemia, unspecified: Secondary | ICD-10-CM | POA: Diagnosis not present

## 2016-03-04 DIAGNOSIS — I10 Essential (primary) hypertension: Secondary | ICD-10-CM | POA: Diagnosis not present

## 2016-03-04 DIAGNOSIS — E119 Type 2 diabetes mellitus without complications: Secondary | ICD-10-CM | POA: Insufficient documentation

## 2016-03-04 DIAGNOSIS — Z1239 Encounter for other screening for malignant neoplasm of breast: Secondary | ICD-10-CM

## 2016-03-04 DIAGNOSIS — IMO0001 Reserved for inherently not codable concepts without codable children: Secondary | ICD-10-CM

## 2016-03-04 LAB — POCT URINALYSIS DIPSTICK
Bilirubin, UA: NEGATIVE
Blood, UA: NEGATIVE
GLUCOSE UA: NEGATIVE
Ketones, UA: NEGATIVE
Leukocytes, UA: NEGATIVE
NITRITE UA: NEGATIVE
Protein, UA: NEGATIVE
Spec Grav, UA: 1.01
UROBILINOGEN UA: 0.2
pH, UA: 6

## 2016-03-04 LAB — COMPREHENSIVE METABOLIC PANEL
ALK PHOS: 74 U/L (ref 38–126)
ALT: 13 U/L — ABNORMAL LOW (ref 14–54)
AST: 16 U/L (ref 15–41)
Albumin: 4.2 g/dL (ref 3.5–5.0)
Anion gap: 7 (ref 5–15)
BILIRUBIN TOTAL: 0.6 mg/dL (ref 0.3–1.2)
BUN: 18 mg/dL (ref 6–20)
CALCIUM: 9 mg/dL (ref 8.9–10.3)
CO2: 25 mmol/L (ref 22–32)
CREATININE: 0.83 mg/dL (ref 0.44–1.00)
Chloride: 107 mmol/L (ref 101–111)
GFR calc Af Amer: >60 mL/min (ref >60)
Glucose, Bld: 122 mg/dL — ABNORMAL HIGH (ref 65–99)
POTASSIUM: 3.8 mmol/L (ref 3.5–5.1)
Sodium: 139 mmol/L (ref 135–145)
TOTAL PROTEIN: 7.6 g/dL (ref 6.5–8.1)

## 2016-03-04 LAB — LIPID PANEL
CHOL/HDL RATIO: 6.6 ratio
CHOLESTEROL: 223 mg/dL — AB (ref 0–200)
HDL: 34 mg/dL — ABNORMAL LOW (ref >40)
LDL Cholesterol: 159 mg/dL — ABNORMAL HIGH (ref 0–99)
Triglycerides: 151 mg/dL — ABNORMAL HIGH (ref <150)
VLDL: 30 mg/dL (ref 0–40)

## 2016-03-04 LAB — CBC WITH DIFFERENTIAL/PLATELET
BASOS ABS: 0.1 10*3/uL (ref 0–0.1)
Basophils Relative: 1 %
Eosinophils Absolute: 0.3 10*3/uL (ref 0–0.7)
Eosinophils Relative: 3 %
HEMATOCRIT: 38.4 % (ref 35.0–47.0)
HEMOGLOBIN: 12.7 g/dL (ref 12.0–16.0)
LYMPHS PCT: 31 %
Lymphs Abs: 2.8 10*3/uL (ref 1.0–3.6)
MCH: 28.8 pg (ref 26.0–34.0)
MCHC: 33 g/dL (ref 32.0–36.0)
MCV: 87.1 fL (ref 80.0–100.0)
MONO ABS: 0.5 10*3/uL (ref 0.2–0.9)
MONOS PCT: 6 %
NEUTROS ABS: 5.4 10*3/uL (ref 1.4–6.5)
Neutrophils Relative %: 59 %
Platelets: 158 10*3/uL (ref 150–440)
RBC: 4.41 MIL/uL (ref 3.80–5.20)
RDW: 13.2 % (ref 11.5–14.5)
WBC: 9.2 10*3/uL (ref 3.6–11.0)

## 2016-03-04 LAB — TSH: TSH: 4.126 u[IU]/mL (ref 0.350–4.500)

## 2016-03-04 MED ORDER — METOPROLOL TARTRATE 25 MG PO TABS: 25.0000 mg | ORAL_TABLET | Freq: Two times a day (BID) | ORAL | 12 refills | Status: DC

## 2016-03-04 MED ORDER — GLIMEPIRIDE 2 MG PO TABS: 2.0000 mg | ORAL_TABLET | Freq: Every day | ORAL | 12 refills | Status: DC

## 2016-03-04 MED ORDER — METFORMIN HCL 500 MG PO TABS: 250.0000 mg | ORAL_TABLET | Freq: Two times a day (BID) | ORAL | 12 refills | Status: DC

## 2016-03-04 NOTE — Progress Notes (Signed)
Date:  03/04/2016   Name:  Sarah [name]   DOB:  30-Jan-1956   MRN:  [id number]   Chief Complaint: Annual Exam and Diabetes (refills. BS 120-140) Sarah [name] is a 60 y.o. female who presents today for her Complete Annual Exam. She feels well. She reports exercising on the weekends. She reports she is sleeping well. She is due for a mammogram.  She had Cologuard about 4 years ago - negative.  She does not have anyone to drive her for a colonoscopy.  Diabetes  Pertinent negatives for hypoglycemia include no dizziness, headaches, nervousness/anxiousness or tremors. Pertinent negatives for diabetes include no chest pain, no fatigue, no polydipsia and no polyuria.  Hypertension  Pertinent negatives include no chest pain, headaches, palpitations or shortness of breath. Hypertensive end-organ damage includes a thyroid problem.  Thyroid Problem  Presents for follow-up visit. Patient reports no anxiety, constipation, diarrhea, fatigue, palpitations or tremors. The symptoms have been stable.    Review of Systems  Constitutional: Negative for appetite change, chills, fatigue, fever and unexpected weight change.  HENT: Negative for congestion, hearing loss, tinnitus, trouble swallowing and voice change.   Eyes: Positive for discharge. Negative for visual disturbance.  Respiratory: Negative for cough, chest tightness, shortness of breath and wheezing.   Cardiovascular: Negative for chest pain, palpitations and leg swelling.  Gastrointestinal: Negative for abdominal pain, constipation, diarrhea and vomiting.  Endocrine: Negative for polydipsia and polyuria.  Genitourinary: Negative for dysuria, frequency, genital sores, hematuria, vaginal bleeding and vaginal discharge.  Musculoskeletal: Negative for arthralgias, gait problem and joint swelling.  Skin: Negative for color change and rash.  Neurological: Negative for dizziness, tremors, light-headedness, numbness and headaches.    Hematological: Negative for adenopathy. Does not bruise/bleed easily.  Psychiatric/Behavioral: Negative for dysphoric mood and sleep disturbance. The patient is not nervous/anxious.     Patient Active Problem List   Diagnosis Date Noted  . Hyperlipidemia associated with type 2 diabetes mellitus ([place]) 03/05/2015  . Biceps tendinitis 08/10/2014  . Essential (primary) hypertension 08/10/2014  . Hyperthyroidism 08/10/2014  . Diabetes mellitus type 2, uncontrolled ([place]) 08/10/2014    Prior to Admission medications   Medication Sig Start Date End Date Taking? Authorizing Provider  glimepiride (AMARYL) 2 MG tablet Take 1 tablet (2 mg total) by mouth daily before breakfast. 11/05/15  Yes Glean Hess, MD  loratadine (ALLERGY) 10 MG tablet Take 10 mg by mouth daily.   Yes Historical Provider, MD  metFORMIN (GLUCOPHAGE) 500 MG tablet Take 0.5 tablets (250 mg total) by mouth 2 (two) times daily. 07/14/15  Yes Glean Hess, MD  methimazole (TAPAZOLE) 10 MG tablet TAKE ONE TABLET BY MOUTH ONCE DAILY 01/25/16  Yes Glean Hess, MD  metoprolol tartrate (LOPRESSOR) 25 MG tablet TAKE ONE TABLET BY MOUTH TWICE DAILY FOR BLOOD PRESSURE 04/01/15  Yes Glean Hess, MD    Allergies  Allergen Reactions  . Ace Inhibitors Nausea Only  . Wilder Glade [Dapagliflozin]     Vaginitis     Past Surgical History:  Procedure Laterality Date  . ABDOMINAL HYSTERECTOMY    . PARTIAL HYSTERECTOMY      Social History  Substance Use Topics  . Smoking status: Former Smoker    Types: E-cigarettes  . Smokeless tobacco: Never Used  . Alcohol use No     Medication list has been reviewed and updated.   Physical Exam  Constitutional: She is oriented to person, place, and time. She appears well-developed and well-nourished. No distress.  HENT:  Head: Normocephalic and atraumatic.  Right Ear: Tympanic membrane and ear canal normal.  Left Ear: Tympanic membrane and ear canal normal.  Nose: Right sinus  exhibits no maxillary sinus tenderness. Left sinus exhibits no maxillary sinus tenderness.  Mouth/Throat: Uvula is midline and oropharynx is clear and moist.  Eyes: Conjunctivae and EOM are normal. Right eye exhibits no discharge. Left eye exhibits no discharge. No scleral icterus.  Neck: Normal range of motion. Carotid bruit is not present. No erythema present. Thyroid mass (fullness of right thyroid - non tender) present. No thyromegaly present.  Cardiovascular: Normal rate, regular rhythm, normal heart sounds and normal pulses.   Pulmonary/Chest: Effort normal. No respiratory distress. She has no wheezes. Right breast exhibits no mass, no nipple discharge, no skin change and no tenderness. Left breast exhibits no mass, no nipple discharge, no skin change and no tenderness.  Abdominal: Soft. Bowel sounds are normal. There is no hepatosplenomegaly. There is no tenderness. There is no CVA tenderness.  Musculoskeletal: Normal range of motion.  Lymphadenopathy:    She has no cervical adenopathy.    She has no axillary adenopathy.  Neurological: She is alert and oriented to person, place, and time. She has normal reflexes. No cranial nerve deficit or sensory deficit.  Skin: Skin is warm, dry and intact. No rash noted.  Psychiatric: She has a normal mood and affect. Her speech is normal and behavior is normal. Thought content normal.  Nursing note and vitals reviewed.   BP 122/82   Pulse 74   Resp 16   Ht 5' 4.5" (1.638 m)   Wt 186 lb (84.4 kg)   SpO2 100%   BMI 31.43 kg/m   Assessment and Plan: 1. Annual physical exam Continue healthy diet and exercise - POCT urinalysis dipstick  2. Breast cancer screening - MM DIGITAL SCREENING BILATERAL; Future  3. Essential (primary) hypertension controlled - metoprolol tartrate (LOPRESSOR) 25 MG tablet; Take 1 tablet (25 mg total) by mouth 2 (two) times daily.  Dispense: 60 tablet; Refill: 12 - CBC with Differential/Platelet  4. Uncontrolled  type 2 diabetes mellitus without complication, without long-term current use of insulin (HCC) Continue current medication - Microalbumin / creatinine urine ratio - glimepiride (AMARYL) 2 MG tablet; Take 1 tablet (2 mg total) by mouth daily before breakfast.  Dispense: 30 tablet; Refill: 12 - metFORMIN (GLUCOPHAGE) 500 MG tablet; Take 0.5 tablets (250 mg total) by mouth 2 (two) times daily.  Dispense: 30 tablet; Refill: 12 - Comprehensive metabolic panel - Hemoglobin A1c  5. Hyperlipidemia associated with type 2 diabetes mellitus ([place]) Consider statin therapy - Lipid panel  6. Hyperthyroidism On methimazole with stable sx - TSH  7. Colon cancer screening - Cologuard   Halina Maidens, MD [facility]  03/04/2016

## 2016-03-04 NOTE — Patient Instructions (Signed)
Breast Self-Awareness Introduction Breast self-awareness means being familiar with how your breasts look and feel. It involves checking your breasts regularly and reporting any changes to your health care provider. Practicing breast self-awareness is important. A change in your breasts can be a sign of a serious medical problem. Being familiar with how your breasts look and feel allows you to find any problems early, when treatment is more likely to be successful. All women should practice breast self-awareness, including women who have had breast implants. How to do a breast self-exam One way to learn what is normal for your breasts and whether your breasts are changing is to do a breast self-exam. To do a breast self-exam: Look for Changes  1. Remove all the clothing above your waist. 2. Stand in front of a mirror in a room with good lighting. 3. Put your hands on your hips. 4. Push your hands firmly downward. 5. Compare your breasts in the mirror. Look for differences between them (asymmetry), such as:  Differences in shape.  Differences in size.  Puckers, dips, and bumps in one breast and not the other. 6. Look at each breast for changes in your skin, such as:  Redness.  Scaly areas. 7. Look for changes in your nipples, such as:  Discharge.  Bleeding.  Dimpling.  Redness.  A change in position. Feel for Changes  Carefully feel your breasts for lumps and changes. It is best to do this while lying on your back on the floor and again while sitting or standing in the shower or tub with soapy water on your skin. Feel each breast in the following way:  Place the arm on the side of the breast you are examining above your head.  Feel your breast with the other hand.  Start in the nipple area and make  inch (2 cm) overlapping circles to feel your breast. Use the pads of your three middle fingers to do this. Apply light pressure, then medium pressure, then firm pressure. The light  pressure will allow you to feel the tissue closest to the skin. The medium pressure will allow you to feel the tissue that is a little deeper. The firm pressure will allow you to feel the tissue close to the ribs.  Continue the overlapping circles, moving downward over the breast until you feel your ribs below your breast.  Move one finger-width toward the center of the body. Continue to use the  inch (2 cm) overlapping circles to feel your breast as you move slowly up toward your collarbone.  Continue the up and down exam using all three pressures until you reach your armpit. Write Down What You Find  Write down what is normal for each breast and any changes that you find. Keep a written record with breast changes or normal findings for each breast. By writing this information down, you do not need to depend only on memory for size, tenderness, or location. Write down where you are in your menstrual cycle, if you are still menstruating. If you are having trouble noticing differences in your breasts, do not get discouraged. With time you will become more familiar with the variations in your breasts and more comfortable with the exam. How often should I examine my breasts? Examine your breasts every month. If you are breastfeeding, the best time to examine your breasts is after a feeding or after using a breast pump. If you menstruate, the best time to examine your breasts is 5-7 days after your  period is over. During your period, your breasts are lumpier, and it may be more difficult to notice changes. When should I see my health care provider? See your health care provider if you notice:  A change in shape or size of your breasts or nipples.  A change in the skin of your breast or nipples, such as a reddened or scaly area.  Unusual discharge from your nipples.  A lump or thick area that was not there before.  Pain in your breasts.  Anything that concerns you. This information is not  intended to replace advice given to you by your health care provider. Make sure you discuss any questions you have with your health care provider. Document Released: 03/14/2005 Document Revised: 08/20/2015 Document Reviewed: 02/01/2015  2017 Elsevier

## 2016-03-05 LAB — HEMOGLOBIN A1C
HEMOGLOBIN A1C: 6.3 % — AB (ref 4.8–5.6)
MEAN PLASMA GLUCOSE: 134 mg/dL

## 2016-03-08 ENCOUNTER — Other Ambulatory Visit: Payer: Self-pay | Admitting: Internal Medicine

## 2016-03-08 LAB — MICROALBUMIN / CREATININE URINE RATIO
Creatinine, Urine: 51.9 mg/dL
MICROALB/CREAT RATIO: 7.3 mg/g{creat} (ref 0.0–30.0)
Microalbumin, Urine: 3.8 ug/mL

## 2016-03-08 MED ORDER — ATORVASTATIN CALCIUM 10 MG PO TABS: 10.0000 mg | ORAL_TABLET | Freq: Every day | ORAL | 5 refills | Status: DC

## 2016-03-23 LAB — COLOGUARD

## 2016-04-05 ENCOUNTER — Ambulatory Visit
Admission: RE | Admit: 2016-04-05 | Discharge: 2016-04-05 | Disposition: A | Discharge status: Home / Self Care | Payer: No Typology Code available for payment source | Source: Ambulatory Visit | Attending: Internal Medicine | Admitting: Internal Medicine

## 2016-04-05 ENCOUNTER — Encounter: Payer: Self-pay | Admitting: Internal Medicine

## 2016-04-05 ENCOUNTER — Telehealth: Payer: Self-pay | Admitting: Internal Medicine

## 2016-04-05 DIAGNOSIS — Z1231 Encounter for screening mammogram for malignant neoplasm of breast: Secondary | ICD-10-CM | POA: Insufficient documentation

## 2016-04-05 DIAGNOSIS — Z1239 Encounter for other screening for malignant neoplasm of breast: Secondary | ICD-10-CM

## 2016-04-05 NOTE — Telephone Encounter (Signed)
Tried multiple times calling pt but Her phone unable to let me leave VM.Marland KitchenMarland KitchenIm sending the lab result through the mail

## 2016-04-06 ENCOUNTER — Ambulatory Visit: Payer: PRIVATE HEALTH INSURANCE | Admitting: Internal Medicine

## 2016-05-06 ENCOUNTER — Ambulatory Visit (INDEPENDENT_AMBULATORY_CARE_PROVIDER_SITE_OTHER): Payer: PRIVATE HEALTH INSURANCE | Admitting: Family Medicine

## 2016-05-06 ENCOUNTER — Encounter: Payer: Self-pay | Admitting: Family Medicine

## 2016-05-06 VITALS — BP 122/88 | HR 62 | Temp 97.7°F | Resp 14 | Wt 189.8 lb

## 2016-05-06 DIAGNOSIS — H6121 Impacted cerumen, right ear: Secondary | ICD-10-CM

## 2016-05-06 DIAGNOSIS — H9201 Otalgia, right ear: Secondary | ICD-10-CM

## 2016-05-06 NOTE — Progress Notes (Signed)
Patient: Sarah [name] Female    DOB: [birth year]   61 y.o.   MRN: [id number] Visit Date: 05/06/2016  Today's Provider: Vernie Murders, PA   Chief Complaint  Patient presents with  . Ear Pain   Subjective:    Otalgia   There is pain in the left ear. This is a new problem. The current episode started 1 to 4 weeks ago. The problem occurs constantly. The problem has been unchanged. There has been no fever. She has tried ear drops for the symptoms. The treatment provided no relief.   Patient Active Problem List   Diagnosis Date Noted  . Hyperlipidemia associated with type 2 diabetes mellitus ([place]) 03/05/2015  . Biceps tendinitis 08/10/2014  . Essential (primary) hypertension 08/10/2014  . Hyperthyroidism 08/10/2014  . Diabetes mellitus type 2, uncontrolled ([place]) 08/10/2014   Past Surgical History:  Procedure Laterality Date  . ABDOMINAL HYSTERECTOMY    . PARTIAL HYSTERECTOMY     Family History  Problem Relation Age of Onset  . Diabetes Mother   . Diabetes Sister   . Breast cancer Neg Hx    Allergies  Allergen Reactions  . Ace Inhibitors Nausea Only  . Farxiga [Dapagliflozin]     Vaginitis      Previous Medications   ATORVASTATIN (LIPITOR) 10 MG TABLET    Take 1 tablet (10 mg total) by mouth daily.   GLIMEPIRIDE (AMARYL) 2 MG TABLET    Take 1 tablet (2 mg total) by mouth daily before breakfast.   LORATADINE (ALLERGY) 10 MG TABLET    Take 10 mg by mouth daily.   METFORMIN (GLUCOPHAGE) 500 MG TABLET    Take 0.5 tablets (250 mg total) by mouth 2 (two) times daily.   METHIMAZOLE (TAPAZOLE) 10 MG TABLET    TAKE ONE TABLET BY MOUTH ONCE DAILY   METOPROLOL TARTRATE (LOPRESSOR) 25 MG TABLET    Take 1 tablet (25 mg total) by mouth 2 (two) times daily.    Review of Systems  Constitutional: Negative.   HENT: Positive for ear pain.   Respiratory: Negative.   Cardiovascular: Negative.     Social History  Substance Use Topics  . Smoking status: Former Smoker    Types:  E-cigarettes  . Smokeless tobacco: Never Used  . Alcohol use No   Objective:   BP 122/88 (BP Location: Right Arm, Patient Position: Sitting, Cuff Size: Normal)   Pulse 62   Temp 97.7 F (36.5 C) (Oral)   Resp 14   Wt 189 lb 12.8 oz (86.1 kg)   SpO2 98%   BMI 32.08 kg/m   Physical Exam  Constitutional: She is oriented to person, place, and time. She appears well-developed and well-nourished. No distress.  HENT:  Head: Normocephalic and atraumatic.  Right Ear: Hearing normal.  Left Ear: Hearing and external ear normal.  Nose: Nose normal.  Mouth/Throat: Oropharynx is clear and moist.  Right ear canal occluded with wax causing discomfort. No drainage or swelling.  Eyes: Conjunctivae and lids are normal. Right eye exhibits no discharge. Left eye exhibits no discharge. No scleral icterus.  Pulmonary/Chest: Effort normal. No respiratory distress.  Musculoskeletal: Normal range of motion.  Neurological: She is alert and oriented to person, place, and time.  Skin: Skin is intact. No lesion and no rash noted.  Psychiatric: She has a normal mood and affect. Her speech is normal and behavior is normal. Thought content normal.      Assessment & Plan:     1. Impacted  cerumen of right ear Has had discomfort and attempted use of Debrox wax removal kit as recommended by plant nurse after problems with hearing test a week ago. Irrigated canal clear of wax and TM appears normal. May proceed with recheck of hearing test by the plant nurse. Will [facility] at [facility] as needed.  2. Otalgia of right ear Onset over the past couple weeks. No drainage or fever. Secondary to cerumen impaction and resolved when was irrigated out of the ear this morning. Recheck prn.

## 2016-07-03 ENCOUNTER — Other Ambulatory Visit: Payer: Self-pay | Admitting: Internal Medicine

## 2016-07-04 ENCOUNTER — Ambulatory Visit: Payer: PRIVATE HEALTH INSURANCE | Admitting: Internal Medicine

## 2016-07-12 ENCOUNTER — Ambulatory Visit (INDEPENDENT_AMBULATORY_CARE_PROVIDER_SITE_OTHER): Payer: PRIVATE HEALTH INSURANCE | Admitting: Internal Medicine

## 2016-07-12 ENCOUNTER — Other Ambulatory Visit
Admission: RE | Admit: 2016-07-12 | Discharge: 2016-07-12 | Disposition: A | Discharge status: Home / Self Care | Payer: No Typology Code available for payment source | Source: Ambulatory Visit | Attending: Internal Medicine | Admitting: Internal Medicine

## 2016-07-12 ENCOUNTER — Encounter: Payer: Self-pay | Admitting: Internal Medicine

## 2016-07-12 VITALS — BP 136/78 | HR 60 | Ht 64.5 in | Wt 192.6 lb

## 2016-07-12 DIAGNOSIS — E1169 Type 2 diabetes mellitus with other specified complication: Secondary | ICD-10-CM | POA: Diagnosis not present

## 2016-07-12 DIAGNOSIS — E1165 Type 2 diabetes mellitus with hyperglycemia: Secondary | ICD-10-CM

## 2016-07-12 DIAGNOSIS — E118 Type 2 diabetes mellitus with unspecified complications: Secondary | ICD-10-CM | POA: Insufficient documentation

## 2016-07-12 DIAGNOSIS — IMO0001 Reserved for inherently not codable concepts without codable children: Secondary | ICD-10-CM

## 2016-07-12 DIAGNOSIS — E785 Hyperlipidemia, unspecified: Secondary | ICD-10-CM | POA: Diagnosis not present

## 2016-07-12 DIAGNOSIS — I10 Essential (primary) hypertension: Secondary | ICD-10-CM

## 2016-07-12 NOTE — Progress Notes (Signed)
Date:  07/12/2016   Name:  Sarah [name]   DOB:  1955-06-24   MRN:  [id number]   Chief Complaint: Hypertension and Hyperlipidemia (Needs Atorvastatin filled.) Hypertension  This is a chronic problem. The problem is unchanged. The problem is controlled. Pertinent negatives include no chest pain, palpitations or shortness of breath. There are no associated agents to hypertension. Past treatments include beta blockers.  Hyperlipidemia  This is a chronic problem. Pertinent negatives include no chest pain or shortness of breath. Current antihyperlipidemic treatment includes statins.  Diabetes  She presents for her follow-up diabetic visit. She has type 2 diabetes mellitus. Her disease course has been improving. Pertinent negatives for diabetes include no chest pain and no fatigue. Current diabetic treatment includes oral agent (dual therapy).    Lab Results  Component Value Date   HGBA1C 6.3 (H) 03/04/2016     Review of Systems  Constitutional: Negative for chills, fatigue and fever.  Eyes: Negative for visual disturbance.  Respiratory: Negative for cough, chest tightness, shortness of breath and wheezing.   Cardiovascular: Negative for chest pain and palpitations.  Gastrointestinal: Negative for abdominal pain.  Musculoskeletal: Negative for arthralgias and gait problem.    Patient Active Problem List   Diagnosis Date Noted  . Hyperlipidemia associated with type 2 diabetes mellitus ([place]) 03/05/2015  . Biceps tendinitis 08/10/2014  . Essential (primary) hypertension 08/10/2014  . Hyperthyroidism 08/10/2014  . Diabetes mellitus type 2, uncontrolled ([place]) 08/10/2014    Prior to Admission medications   Medication Sig Start Date End Date Taking? Authorizing Provider  atorvastatin (LIPITOR) 10 MG tablet Take 1 tablet (10 mg total) by mouth daily. 03/08/16  Yes Glean Hess, MD  glimepiride (AMARYL) 2 MG tablet Take 1 tablet (2 mg total) by mouth daily before breakfast.  03/04/16  Yes Glean Hess, MD  loratadine (ALLERGY) 10 MG tablet Take 10 mg by mouth daily.   Yes Historical Provider, MD  metFORMIN (GLUCOPHAGE) 500 MG tablet Take 0.5 tablets (250 mg total) by mouth 2 (two) times daily. 03/04/16  Yes Glean Hess, MD  methimazole (TAPAZOLE) 10 MG tablet TAKE ONE TABLET BY MOUTH ONCE DAILY 07/03/16  Yes Glean Hess, MD  metoprolol tartrate (LOPRESSOR) 25 MG tablet Take 1 tablet (25 mg total) by mouth 2 (two) times daily. 03/04/16  Yes Glean Hess, MD    Allergies  Allergen Reactions  . Ace Inhibitors Nausea Only  . Wilder Glade [Dapagliflozin]     Vaginitis     Past Surgical History:  Procedure Laterality Date  . ABDOMINAL HYSTERECTOMY    . PARTIAL HYSTERECTOMY      Social History  Substance Use Topics  . Smoking status: Former Smoker    Types: E-cigarettes  . Smokeless tobacco: Never Used  . Alcohol use No     Medication list has been reviewed and updated.   Physical Exam  Constitutional: She is oriented to person, place, and time. She appears well-developed. No distress.  HENT:  Head: Normocephalic and atraumatic.  Neck: Normal range of motion. Neck supple. Carotid bruit is not present.  Cardiovascular: Normal rate, regular rhythm and normal heart sounds.   Pulmonary/Chest: Effort normal and breath sounds normal. No respiratory distress. She has no wheezes.  Musculoskeletal: Normal range of motion.  Neurological: She is alert and oriented to person, place, and time.  Skin: Skin is warm and dry. No rash noted.  Psychiatric: She has a normal mood and affect. Her speech is normal and  behavior is normal. Thought content normal.  Nursing note and vitals reviewed.   BP 140/78 (BP Location: Right Arm, Patient Position: Sitting, Cuff Size: Large)   Pulse 60   Ht 5' 4.5" (1.638 m)   Wt 192 lb 9.6 oz (87.4 kg)   SpO2 100%   BMI 32.55 kg/m   Assessment and Plan: 1. Essential (primary) hypertension Fair control Continue  current medications and work on diet and exercise  2. Uncontrolled type 2 diabetes mellitus without complication, without long-term current use of insulin (HCC) - Hemoglobin A1c  3. Hyperlipidemia associated with type 2 diabetes mellitus ([place]) On statin therapy   No orders of the defined types were placed in this encounter.   Halina Maidens, MD [facility]  07/12/2016

## 2016-07-13 LAB — HEMOGLOBIN A1C
HEMOGLOBIN A1C: 6.9 % — AB (ref 4.8–5.6)
MEAN PLASMA GLUCOSE: 151 mg/dL

## 2016-09-17 ENCOUNTER — Other Ambulatory Visit: Payer: Self-pay | Admitting: Internal Medicine

## 2016-11-11 ENCOUNTER — Ambulatory Visit (INDEPENDENT_AMBULATORY_CARE_PROVIDER_SITE_OTHER): Payer: PRIVATE HEALTH INSURANCE | Admitting: Internal Medicine

## 2016-11-11 ENCOUNTER — Ambulatory Visit
Admission: RE | Admit: 2016-11-11 | Discharge: 2016-11-11 | Disposition: A | Discharge status: Home / Self Care | Payer: No Typology Code available for payment source | Source: Ambulatory Visit | Attending: Internal Medicine | Admitting: Internal Medicine

## 2016-11-11 ENCOUNTER — Encounter: Payer: Self-pay | Admitting: Internal Medicine

## 2016-11-11 ENCOUNTER — Other Ambulatory Visit
Admission: RE | Admit: 2016-11-11 | Discharge: 2016-11-11 | Disposition: A | Discharge status: Home / Self Care | Payer: No Typology Code available for payment source | Source: Ambulatory Visit | Attending: Internal Medicine | Admitting: Internal Medicine

## 2016-11-11 ENCOUNTER — Other Ambulatory Visit: Payer: Self-pay | Admitting: Internal Medicine

## 2016-11-11 VITALS — BP 122/84 | HR 61 | Ht 64.5 in | Wt 190.0 lb

## 2016-11-11 DIAGNOSIS — E1165 Type 2 diabetes mellitus with hyperglycemia: Secondary | ICD-10-CM | POA: Insufficient documentation

## 2016-11-11 DIAGNOSIS — E785 Hyperlipidemia, unspecified: Secondary | ICD-10-CM

## 2016-11-11 DIAGNOSIS — IMO0001 Reserved for inherently not codable concepts without codable children: Secondary | ICD-10-CM

## 2016-11-11 DIAGNOSIS — K219 Gastro-esophageal reflux disease without esophagitis: Secondary | ICD-10-CM | POA: Insufficient documentation

## 2016-11-11 DIAGNOSIS — E0590 Thyrotoxicosis, unspecified without thyrotoxic crisis or storm: Secondary | ICD-10-CM

## 2016-11-11 DIAGNOSIS — I10 Essential (primary) hypertension: Secondary | ICD-10-CM | POA: Diagnosis not present

## 2016-11-11 DIAGNOSIS — E1169 Type 2 diabetes mellitus with other specified complication: Secondary | ICD-10-CM

## 2016-11-11 DIAGNOSIS — M25552 Pain in left hip: Secondary | ICD-10-CM | POA: Insufficient documentation

## 2016-11-11 DIAGNOSIS — M1610 Unilateral primary osteoarthritis, unspecified hip: Secondary | ICD-10-CM

## 2016-11-11 LAB — LIPID PANEL
CHOLESTEROL: 153 mg/dL (ref 0–200)
HDL: 33 mg/dL — ABNORMAL LOW (ref >40)
LDL Cholesterol: 77 mg/dL (ref 0–99)
TRIGLYCERIDES: 216 mg/dL — AB (ref <150)
Total CHOL/HDL Ratio: 4.6 RATIO
VLDL: 43 mg/dL — AB (ref 0–40)

## 2016-11-11 LAB — COMPREHENSIVE METABOLIC PANEL
ALT: 16 U/L (ref 14–54)
AST: 22 U/L (ref 15–41)
Albumin: 4.3 g/dL (ref 3.5–5.0)
Alkaline Phosphatase: 93 U/L (ref 38–126)
Anion gap: 7 (ref 5–15)
BILIRUBIN TOTAL: 0.7 mg/dL (ref 0.3–1.2)
BUN: 15 mg/dL (ref 6–20)
CO2: 26 mmol/L (ref 22–32)
CREATININE: 0.8 mg/dL (ref 0.44–1.00)
Calcium: 9.3 mg/dL (ref 8.9–10.3)
Chloride: 107 mmol/L (ref 101–111)
GFR calc Af Amer: >60 mL/min (ref >60)
Glucose, Bld: 189 mg/dL — ABNORMAL HIGH (ref 65–99)
Potassium: 3.3 mmol/L — ABNORMAL LOW (ref 3.5–5.1)
Sodium: 140 mmol/L (ref 135–145)
TOTAL PROTEIN: 8.2 g/dL — AB (ref 6.5–8.1)

## 2016-11-11 NOTE — Patient Instructions (Signed)
Continue ibuprofen and topical rubs for hip and other joint pain.

## 2016-11-11 NOTE — Progress Notes (Signed)
Date:  11/11/2016   Name:  Sarah [name]   DOB:  [birth year]   MRN:  [id number]   Chief Complaint: Diabetes (Haven't tested BS in a while. Wants to discuss getting machine that you scan on your arm so she no longer has to prick her fingers. ) and Hypertension Diabetes  Pertinent negatives for hypoglycemia include no headaches or tremors. Pertinent negatives for diabetes include no chest pain, no fatigue, no polydipsia and no polyuria.  Hypertension  Pertinent negatives include no chest pain, headaches, palpitations or shortness of breath.  Hip Pain   There was no injury mechanism. The pain is present in the left hip. The pain is moderate. Associated symptoms include an inability to bear weight. Pertinent negatives include no numbness. She reports no foreign bodies present. She has tried NSAIDs (and muscle relaxant) for the symptoms.   Lab Results  Component Value Date   HGBA1C 6.9 (H) 07/12/2016    Review of Systems  Constitutional: Negative for appetite change, fatigue, fever and unexpected weight change.  HENT: Negative for tinnitus and trouble swallowing.   Eyes: Negative for visual disturbance.  Respiratory: Negative for cough, chest tightness and shortness of breath.   Cardiovascular: Negative for chest pain, palpitations and leg swelling.  Gastrointestinal: Negative for abdominal pain.  Endocrine: Negative for polydipsia and polyuria.  Genitourinary: Negative for dysuria and hematuria.  Musculoskeletal: Positive for arthralgias and gait problem.  Skin: Negative for rash.  Neurological: Negative for tremors, numbness and headaches.  Psychiatric/Behavioral: Negative for dysphoric mood and sleep disturbance.    Patient Active Problem List   Diagnosis Date Noted  . Hyperlipidemia associated with type 2 diabetes mellitus ([place]) 03/05/2015  . Biceps tendinitis 08/10/2014  . Essential (primary) hypertension 08/10/2014  . Hyperthyroidism 08/10/2014  . Uncontrolled type 2  diabetes mellitus without complication, without long-term current use of insulin ([place]) 08/10/2014    Prior to Admission medications   Medication Sig Start Date End Date Taking? Authorizing Provider  atorvastatin (LIPITOR) 10 MG tablet TAKE ONE TABLET BY MOUTH ONCE DAILY 09/18/16   Glean Hess, MD  glimepiride (AMARYL) 2 MG tablet Take 1 tablet (2 mg total) by mouth daily before breakfast. 03/04/16   Glean Hess, MD  loratadine (ALLERGY) 10 MG tablet Take 10 mg by mouth daily.    [provider]  metFORMIN (GLUCOPHAGE) 500 MG tablet Take 0.5 tablets (250 mg total) by mouth 2 (two) times daily. 03/04/16   Glean Hess, MD  methimazole (TAPAZOLE) 10 MG tablet TAKE ONE TABLET BY MOUTH ONCE DAILY 07/03/16   Glean Hess, MD  metoprolol tartrate (LOPRESSOR) 25 MG tablet Take 1 tablet (25 mg total) by mouth 2 (two) times daily. 03/04/16   Glean Hess, MD    Allergies  Allergen Reactions  . Ace Inhibitors Nausea Only  . Wilder Glade [Dapagliflozin]     Vaginitis     Past Surgical History:  Procedure Laterality Date  . ABDOMINAL HYSTERECTOMY    . PARTIAL HYSTERECTOMY      Social History  Substance Use Topics  . Smoking status: Former Smoker    Types: E-cigarettes  . Smokeless tobacco: Never Used  . Alcohol use No    Medication list has been reviewed and updated.  Physical Exam  Constitutional: She is oriented to person, place, and time. She appears well-developed. No distress.  HENT:  Head: Normocephalic and atraumatic.  Neck: Normal range of motion. Neck supple. No tracheal tenderness present. Thyromegaly (enlarged right  lobe) present.  Cardiovascular: Normal rate, regular rhythm and normal heart sounds.   Pulmonary/Chest: Effort normal and breath sounds normal. No respiratory distress. She has no wheezes.  Musculoskeletal:       Left hip: She exhibits decreased range of motion, decreased strength and tenderness.  Neurological: She is alert and oriented  to person, place, and time.  Skin: Skin is warm and dry. No rash noted.  Psychiatric: She has a normal mood and affect. Her speech is normal and behavior is normal. Thought content normal.  Nursing note and vitals reviewed.   BP 122/84   Pulse 61   Ht 5' 4.5" (1.638 m)   Wt 190 lb (86.2 kg)   SpO2 98%   BMI 32.11 kg/m   Assessment and Plan: 1. Uncontrolled type 2 diabetes mellitus without complication, without long-term current use of insulin (HCC) Continue oral agents - Hemoglobin A1c - Comprehensive metabolic panel  2. Essential (primary) hypertension controlled  3. Hyperthyroidism On methimazole; asx  4. Hip pain, acute, left Continue advil and topical rubs May need to see Orthopedics - DG HIP UNILAT WITH PELVIS 2-3 VIEWS LEFT; Future  5. Hyperlipidemia associated with type 2 diabetes mellitus ([place]) Now on statin therapy - check labs - Lipid panel   No orders of the defined types were placed in this encounter.   Halina Maidens, MD [facility]  11/11/2016

## 2016-11-13 LAB — HEMOGLOBIN A1C
Hgb A1c MFr Bld: 7.3 % — ABNORMAL HIGH (ref 4.8–5.6)
MEAN PLASMA GLUCOSE: 162.81 mg/dL

## 2017-01-28 ENCOUNTER — Other Ambulatory Visit: Payer: Self-pay | Admitting: Internal Medicine

## 2017-02-28 DIAGNOSIS — M1612 Unilateral primary osteoarthritis, left hip: Secondary | ICD-10-CM | POA: Insufficient documentation

## 2017-02-28 DIAGNOSIS — M1712 Unilateral primary osteoarthritis, left knee: Secondary | ICD-10-CM | POA: Insufficient documentation

## 2017-03-01 ENCOUNTER — Other Ambulatory Visit: Payer: Self-pay | Admitting: Orthopedic Surgery

## 2017-03-01 DIAGNOSIS — M1612 Unilateral primary osteoarthritis, left hip: Secondary | ICD-10-CM

## 2017-03-01 DIAGNOSIS — M25552 Pain in left hip: Secondary | ICD-10-CM

## 2017-03-06 ENCOUNTER — Encounter: Payer: PRIVATE HEALTH INSURANCE | Admitting: Internal Medicine

## 2017-03-09 ENCOUNTER — Ambulatory Visit
Admission: RE | Admit: 2017-03-09 | Discharge: 2017-03-09 | Disposition: A | Discharge status: Home / Self Care | Payer: PRIVATE HEALTH INSURANCE | Source: Ambulatory Visit | Attending: Orthopedic Surgery | Admitting: Orthopedic Surgery

## 2017-03-09 DIAGNOSIS — M47896 Other spondylosis, lumbar region: Secondary | ICD-10-CM | POA: Diagnosis not present

## 2017-03-09 DIAGNOSIS — M25552 Pain in left hip: Secondary | ICD-10-CM | POA: Diagnosis not present

## 2017-03-09 DIAGNOSIS — M1611 Unilateral primary osteoarthritis, right hip: Secondary | ICD-10-CM | POA: Diagnosis not present

## 2017-03-09 DIAGNOSIS — M1612 Unilateral primary osteoarthritis, left hip: Secondary | ICD-10-CM | POA: Insufficient documentation

## 2017-03-12 ENCOUNTER — Other Ambulatory Visit: Payer: Self-pay | Admitting: Internal Medicine

## 2017-03-12 DIAGNOSIS — E1165 Type 2 diabetes mellitus with hyperglycemia: Principal | ICD-10-CM

## 2017-03-12 DIAGNOSIS — IMO0001 Reserved for inherently not codable concepts without codable children: Secondary | ICD-10-CM

## 2017-03-23 ENCOUNTER — Ambulatory Visit
Admission: RE | Admit: 2017-03-23 | Discharge: 2017-03-23 | Disposition: A | Discharge status: Home / Self Care | Payer: No Typology Code available for payment source | Source: Ambulatory Visit | Attending: Internal Medicine | Admitting: Internal Medicine

## 2017-03-23 ENCOUNTER — Other Ambulatory Visit: Payer: Self-pay | Admitting: Internal Medicine

## 2017-03-23 ENCOUNTER — Ambulatory Visit (INDEPENDENT_AMBULATORY_CARE_PROVIDER_SITE_OTHER): Payer: No Typology Code available for payment source | Admitting: Internal Medicine

## 2017-03-23 ENCOUNTER — Encounter: Payer: Self-pay | Admitting: Internal Medicine

## 2017-03-23 VITALS — BP 124/80 | HR 71 | Ht 64.5 in | Wt 180.8 lb

## 2017-03-23 DIAGNOSIS — E0590 Thyrotoxicosis, unspecified without thyrotoxic crisis or storm: Secondary | ICD-10-CM | POA: Diagnosis not present

## 2017-03-23 DIAGNOSIS — Z1239 Encounter for other screening for malignant neoplasm of breast: Secondary | ICD-10-CM

## 2017-03-23 DIAGNOSIS — Z79899 Other long term (current) drug therapy: Secondary | ICD-10-CM | POA: Insufficient documentation

## 2017-03-23 DIAGNOSIS — Z7984 Long term (current) use of oral hypoglycemic drugs: Secondary | ICD-10-CM | POA: Diagnosis not present

## 2017-03-23 DIAGNOSIS — M1712 Unilateral primary osteoarthritis, left knee: Secondary | ICD-10-CM | POA: Diagnosis not present

## 2017-03-23 DIAGNOSIS — I10 Essential (primary) hypertension: Secondary | ICD-10-CM | POA: Diagnosis not present

## 2017-03-23 DIAGNOSIS — Z0000 Encounter for general adult medical examination without abnormal findings: Secondary | ICD-10-CM | POA: Diagnosis not present

## 2017-03-23 DIAGNOSIS — Z791 Long term (current) use of non-steroidal anti-inflammatories (NSAID): Secondary | ICD-10-CM | POA: Diagnosis not present

## 2017-03-23 DIAGNOSIS — IMO0001 Reserved for inherently not codable concepts without codable children: Secondary | ICD-10-CM

## 2017-03-23 DIAGNOSIS — Z888 Allergy status to other drugs, medicaments and biological substances status: Secondary | ICD-10-CM | POA: Insufficient documentation

## 2017-03-23 DIAGNOSIS — Z87891 Personal history of nicotine dependence: Secondary | ICD-10-CM | POA: Diagnosis not present

## 2017-03-23 DIAGNOSIS — Z1211 Encounter for screening for malignant neoplasm of colon: Secondary | ICD-10-CM

## 2017-03-23 DIAGNOSIS — E785 Hyperlipidemia, unspecified: Secondary | ICD-10-CM | POA: Diagnosis not present

## 2017-03-23 DIAGNOSIS — E1165 Type 2 diabetes mellitus with hyperglycemia: Secondary | ICD-10-CM

## 2017-03-23 DIAGNOSIS — E119 Type 2 diabetes mellitus without complications: Secondary | ICD-10-CM | POA: Diagnosis present

## 2017-03-23 LAB — CBC WITH DIFFERENTIAL/PLATELET
Basophils Absolute: 0.1 10*3/uL (ref 0–0.1)
Basophils Relative: 1 %
EOS ABS: 0.5 10*3/uL (ref 0–0.7)
EOS PCT: 5 %
HCT: 41.6 % (ref 35.0–47.0)
Hemoglobin: 13.7 g/dL (ref 12.0–16.0)
LYMPHS ABS: 3.2 10*3/uL (ref 1.0–3.6)
LYMPHS PCT: 32 %
MCH: 28.7 pg (ref 26.0–34.0)
MCHC: 32.8 g/dL (ref 32.0–36.0)
MCV: 87.6 fL (ref 80.0–100.0)
MONO ABS: 0.7 10*3/uL (ref 0.2–0.9)
Monocytes Relative: 7 %
Neutro Abs: 5.6 10*3/uL (ref 1.4–6.5)
Neutrophils Relative %: 55 %
PLATELETS: 162 10*3/uL (ref 150–440)
RBC: 4.76 MIL/uL (ref 3.80–5.20)
RDW: 13.4 % (ref 11.5–14.5)
WBC: 10.1 10*3/uL (ref 3.6–11.0)

## 2017-03-23 LAB — POCT URINALYSIS DIPSTICK
BILIRUBIN UA: NEGATIVE
Glucose, UA: NEGATIVE
Ketones, UA: NEGATIVE
NITRITE UA: POSITIVE
PH UA: 7 (ref 5.0–8.0)
Spec Grav, UA: 1.015 (ref 1.010–1.025)
UROBILINOGEN UA: 0.2 U/dL

## 2017-03-23 LAB — HEMOGLOBIN A1C
Hgb A1c MFr Bld: 6.4 % — ABNORMAL HIGH (ref 4.8–5.6)
MEAN PLASMA GLUCOSE: 136.98 mg/dL

## 2017-03-23 MED ORDER — ATORVASTATIN CALCIUM 10 MG PO TABS: 10.0000 mg | ORAL_TABLET | Freq: Every day | ORAL | 1 refills | Status: DC

## 2017-03-23 NOTE — Patient Instructions (Signed)

## 2017-03-23 NOTE — Progress Notes (Signed)
Date:  03/23/2017   Name:  Sarah [name]   DOB:  1955-07-07   MRN:  [id number]   Chief Complaint: Annual Exam (declines breast exam) Sarah [name] is a 61 y.o. female who presents today for her Complete Annual Exam. She feels poorly due to hip pain. She reports exercising none. She reports she is sleeping fairly well. She denies breast complaints.  Mammogram is due next year.  Diabetes  She presents for her follow-up diabetic visit. She has type 2 diabetes mellitus. Her disease course has been stable. Pertinent negatives for hypoglycemia include no dizziness, headaches, nervousness/anxiousness or tremors. Associated symptoms include weakness. Pertinent negatives for diabetes include no chest pain, no fatigue, no polydipsia and no polyuria. An ACE inhibitor/angiotensin II receptor blocker is contraindicated. Eye exam is current.  Hypertension  This is a chronic problem. The problem is controlled. Pertinent negatives include no chest pain, headaches, palpitations or shortness of breath. Past treatments include beta blockers.  Hip Pain   There was no injury mechanism. The pain is present in the left leg. The quality of the pain is described as aching and cramping. The pain is moderate. The pain has been worsening since onset. She has tried NSAIDs for the symptoms. The treatment provided mild relief.   MRI: IMPRESSION: 1. Severe advanced osteoarthritis of the left hip. 2. Moderate osteoarthritis of the right hip. 3. Lower lumbar spine spondylosis.  Lab Results  Component Value Date   HGBA1C 7.3 (H) 11/11/2016   Lab Results  Component Value Date   CHOL 153 11/11/2016   HDL 33 (L) 11/11/2016   LDLCALC 77 11/11/2016   TRIG 216 (H) 11/11/2016   CHOLHDL 4.6 11/11/2016   Lab Results  Component Value Date   CREATININE 0.80 11/11/2016   BUN 15 11/11/2016   NA 140 11/11/2016   K 3.3 (L) 11/11/2016   CL 107 11/11/2016   CO2 26 11/11/2016      Review of Systems    Constitutional: Negative for chills, fatigue and fever.  HENT: Negative for congestion, hearing loss, tinnitus, trouble swallowing and voice change.   Eyes: Negative for visual disturbance.  Respiratory: Negative for cough, chest tightness, shortness of breath and wheezing.   Cardiovascular: Negative for chest pain, palpitations and leg swelling.  Gastrointestinal: Negative for abdominal pain, constipation, diarrhea and vomiting.  Endocrine: Negative for polydipsia and polyuria.  Genitourinary: Negative for dysuria, frequency, genital sores, vaginal bleeding and vaginal discharge.  Musculoskeletal: Positive for arthralgias and gait problem. Negative for joint swelling.  Skin: Negative for color change and rash.  Neurological: Positive for weakness. Negative for dizziness, tremors, light-headedness and headaches.  Hematological: Negative for adenopathy. Does not bruise/bleed easily.  Psychiatric/Behavioral: Negative for dysphoric mood and sleep disturbance. The patient is not nervous/anxious.     Patient Active Problem List   Diagnosis Date Noted  . Hyperlipidemia associated with type 2 diabetes mellitus ([place]) 03/05/2015  . Biceps tendinitis 08/10/2014  . Essential (primary) hypertension 08/10/2014  . Hyperthyroidism 08/10/2014  . Uncontrolled type 2 diabetes mellitus without complication, without long-term current use of insulin ([place]) 08/10/2014    Prior to Admission medications   Medication Sig Start Date End Date Taking? Authorizing Provider  atorvastatin (LIPITOR) 10 MG tablet TAKE ONE TABLET BY MOUTH ONCE DAILY 09/18/16  Yes Glean Hess, MD  glimepiride (AMARYL) 2 MG tablet TAKE ONE TABLET BY MOUTH ONCE DAILY BEFORE  BREAKFAST 03/12/17  Yes Glean Hess, MD  meloxicam (MOBIC) 15 MG tablet  Take 15 mg by mouth daily.   Yes [provider]  metFORMIN (GLUCOPHAGE) 500 MG tablet Take 0.5 tablets (250 mg total) by mouth 2 (two) times daily. 03/04/16  Yes Glean Hess, MD  methimazole (TAPAZOLE) 10 MG tablet TAKE 1 TABLET BY MOUTH ONCE DAILY 01/28/17  Yes Glean Hess, MD  metoprolol tartrate (LOPRESSOR) 25 MG tablet Take 1 tablet (25 mg total) by mouth 2 (two) times daily. 03/04/16  Yes Glean Hess, MD  traMADol Veatrice Bourbon) 50 MG tablet Take by mouth every 6 (six) hours as needed.   Yes [provider]    Allergies  Allergen Reactions  . Ace Inhibitors Nausea Only  . Wilder Glade [Dapagliflozin]     Vaginitis     Past Surgical History:  Procedure Laterality Date  . ABDOMINAL HYSTERECTOMY    . PARTIAL HYSTERECTOMY      Social History   Tobacco Use  . Smoking status: Former Smoker    Types: E-cigarettes  . Smokeless tobacco: Never Used  Substance Use Topics  . Alcohol use: No    Alcohol/week: 0.0 oz  . Drug use: No     Medication list has been reviewed and updated.  PHQ 2/9 Scores 11/11/2016  PHQ - 2 Score 0    Physical Exam  Constitutional: She is oriented to person, place, and time. She appears well-developed and well-nourished. No distress.  HENT:  Head: Normocephalic and atraumatic.  Right Ear: Tympanic membrane and ear canal normal.  Left Ear: Tympanic membrane and ear canal normal.  Nose: Right sinus exhibits no maxillary sinus tenderness. Left sinus exhibits no maxillary sinus tenderness.  Mouth/Throat: Uvula is midline and oropharynx is clear and moist.  Eyes: Conjunctivae and EOM are normal. Right eye exhibits no discharge. Left eye exhibits no discharge. No scleral icterus.  Neck: Normal range of motion. Carotid bruit is not present. No erythema present. Thyromegaly present.  Cardiovascular: Normal rate, regular rhythm, normal heart sounds and normal pulses.  Pulmonary/Chest: Effort normal. No respiratory distress. She has no wheezes.  Abdominal: Soft. Bowel sounds are normal. There is no hepatosplenomegaly. There is no tenderness. There is no CVA tenderness.  Musculoskeletal:       Right hip: She exhibits  decreased range of motion. She exhibits normal strength and no tenderness.       Left hip: She exhibits decreased range of motion, decreased strength and tenderness.  Lymphadenopathy:    She has no cervical adenopathy.    She has no axillary adenopathy.  Neurological: She is alert and oriented to person, place, and time. She has normal reflexes. No cranial nerve deficit or sensory deficit.  Skin: Skin is warm, dry and intact. No rash noted.  Psychiatric: She has a normal mood and affect. Her speech is normal and behavior is normal. Thought content normal.  Nursing note and vitals reviewed.   BP 124/80   Pulse 71   Ht 5' 4.5" (1.638 m)   Wt 180 lb 12.8 oz (82 kg)   SpO2 99%   BMI 30.55 kg/m   Assessment and Plan: 1. Annual physical exam UA not a clean catch - POCT Urinalysis Dipstick  2. Breast cancer screening Schedule at [facility] if she loses insurance coverage - MM DIGITAL SCREENING BILATERAL; Future  3. Colon cancer screening Deferred for now  4. Essential (primary) hypertension controlled - CBC with Differential/Platelet  5. Uncontrolled type 2 diabetes mellitus without complication, without long-term current use of insulin (HCC) stable - Hemoglobin A1c -  Microalbumin / creatinine urine ratio - atorvastatin (LIPITOR) 10 MG tablet; Take 1 tablet (10 mg total) by mouth daily.  Dispense: 90 tablet; Refill: 1  6. Primary osteoarthritis of left knee Severe in left knee - encourage follow up with Ortho to discuss treatment   Meds ordered this encounter  Medications  . atorvastatin (LIPITOR) 10 MG tablet    Sig: Take 1 tablet (10 mg total) by mouth daily.    Dispense:  90 tablet    Refill:  1    Partially dictated using Editor, commissioning. Any errors are unintentional.  Halina Maidens, MD [facility]  03/23/2017

## 2017-03-27 LAB — MICROALBUMIN / CREATININE URINE RATIO
CREATININE, UR: 98.5 mg/dL
MICROALBUM., U, RANDOM: 19.4 ug/mL
Microalb/Creat Ratio: 19.7 mg/g creat (ref 0.0–30.0)

## 2017-03-31 ENCOUNTER — Other Ambulatory Visit: Payer: Self-pay | Admitting: Internal Medicine

## 2017-03-31 DIAGNOSIS — E1165 Type 2 diabetes mellitus with hyperglycemia: Principal | ICD-10-CM

## 2017-03-31 DIAGNOSIS — IMO0001 Reserved for inherently not codable concepts without codable children: Secondary | ICD-10-CM

## 2017-05-25 ENCOUNTER — Inpatient Hospital Stay: Admission: RE | Admit: 2017-05-25 | Payer: No Typology Code available for payment source | Source: Ambulatory Visit

## 2017-05-26 ENCOUNTER — Inpatient Hospital Stay: Admission: RE | Admit: 2017-05-26 | Payer: PRIVATE HEALTH INSURANCE | Source: Ambulatory Visit

## 2017-05-30 ENCOUNTER — Encounter: Admission: RE | Payer: Self-pay | Source: Ambulatory Visit

## 2017-05-30 ENCOUNTER — Inpatient Hospital Stay
Admission: RE | Admit: 2017-05-30 | Payer: No Typology Code available for payment source | Source: Ambulatory Visit | Admitting: Orthopedic Surgery

## 2017-05-30 SURGERY — ARTHROPLASTY, HIP, TOTAL, ANTERIOR APPROACH
Anesthesia: Choice | Laterality: Left

## 2017-06-02 ENCOUNTER — Other Ambulatory Visit: Payer: Self-pay

## 2017-06-02 MED ORDER — METHIMAZOLE 10 MG PO TABS
10.0000 mg | ORAL_TABLET | Freq: Every day | ORAL | 1 refills | Status: DC
Start: 2017-06-02 — End: 2017-08-16

## 2017-06-14 ENCOUNTER — Other Ambulatory Visit: Payer: Self-pay | Admitting: Internal Medicine

## 2017-06-14 DIAGNOSIS — I10 Essential (primary) hypertension: Secondary | ICD-10-CM

## 2017-07-31 ENCOUNTER — Ambulatory Visit: Payer: Self-pay | Attending: Oncology | Admitting: *Deleted

## 2017-07-31 ENCOUNTER — Ambulatory Visit
Admission: RE | Admit: 2017-07-31 | Discharge: 2017-07-31 | Disposition: A | Discharge status: Home / Self Care | Payer: Self-pay | Source: Ambulatory Visit | Attending: Oncology | Admitting: Oncology

## 2017-07-31 ENCOUNTER — Encounter (INDEPENDENT_AMBULATORY_CARE_PROVIDER_SITE_OTHER): Payer: Self-pay

## 2017-07-31 VITALS — BP 190/89 | HR 77 | Temp 98.2°F | Ht 63.0 in | Wt 187.0 lb

## 2017-07-31 DIAGNOSIS — Z0000 Encounter for general adult medical examination without abnormal findings: Secondary | ICD-10-CM

## 2017-07-31 NOTE — Progress Notes (Signed)
  Subjective:     Patient ID: Sarah [name], female   DOB: 1955/04/11, 62 y.o.   MRN: [id number]  HPI   Review of Systems     Objective:   Physical Exam  Neck: Thyromegaly present.    Pulmonary/Chest: Right breast exhibits no inverted nipple, no mass, no nipple discharge, no skin change and no tenderness. Left breast exhibits no inverted nipple, no mass, no nipple discharge, no skin change and no tenderness.       Assessment:     62 year old Black female presents to [facility] for clinical breast exam and mammogram only.  Clinical breast exam unremarkable.  Taught self breast awareness.  Patient with enlarged right thyroid.  States she has hyperthyroidism.  Blood pressure elevated at 190/89.  States she has not had her blood pressure meds today.  Encouraged her to take her meds as soon as possible and to recheck her blood pressure at [facility] or [facility], and if remains higher than 140/90 she is to follow-up with her primary care provider.  Hand out on hypertention given to patient. Patient was encouraged to discuss her hypertension and enlarged thyroid with her primary care provider, Dr. Carolin Coy at her appointment this Wednesday.  Patient with a history of total hysterectomy for fibroids.  Patient has been screened for eligibility.  She does not have any insurance, Medicare or Medicaid.  She also meets financial eligibility.  Hand-out given on the Affordable Care Act.    Plan:     Screening mammogram ordered.  Will follow-up per [facility] protocol.

## 2017-07-31 NOTE — Patient Instructions (Signed)

## 2017-08-02 ENCOUNTER — Encounter: Payer: Self-pay | Admitting: *Deleted

## 2017-08-02 ENCOUNTER — Ambulatory Visit: Payer: Medicaid Other | Admitting: Internal Medicine

## 2017-08-02 NOTE — Progress Notes (Signed)
Letter mailed from the [facility] to inform patient of her normal mammogram results.  Patient is to follow-up with annual screening in one year.  HSIS to Christy. 

## 2017-08-16 ENCOUNTER — Ambulatory Visit (INDEPENDENT_AMBULATORY_CARE_PROVIDER_SITE_OTHER): Payer: Self-pay | Admitting: Internal Medicine

## 2017-08-16 ENCOUNTER — Encounter: Payer: Self-pay | Admitting: Internal Medicine

## 2017-08-16 VITALS — BP 144/90 | HR 62 | Resp 16 | Ht 63.0 in | Wt 191.0 lb

## 2017-08-16 DIAGNOSIS — M1712 Unilateral primary osteoarthritis, left knee: Secondary | ICD-10-CM

## 2017-08-16 DIAGNOSIS — I10 Essential (primary) hypertension: Secondary | ICD-10-CM

## 2017-08-16 DIAGNOSIS — IMO0001 Reserved for inherently not codable concepts without codable children: Secondary | ICD-10-CM

## 2017-08-16 DIAGNOSIS — E1165 Type 2 diabetes mellitus with hyperglycemia: Secondary | ICD-10-CM

## 2017-08-16 MED ORDER — METHIMAZOLE 10 MG PO TABS: 10.0000 mg | ORAL_TABLET | Freq: Every day | ORAL | 1 refills | Status: DC

## 2017-08-16 MED ORDER — ATORVASTATIN CALCIUM 10 MG PO TABS: 10.0000 mg | ORAL_TABLET | Freq: Every day | ORAL | 1 refills | Status: DC

## 2017-08-16 NOTE — Progress Notes (Signed)
Date:  08/16/2017   Name:  Sarah [name]   DOB:  05/23/1955   MRN:  [id number]   Chief Complaint: Diabetes Diabetes  She presents for her follow-up diabetic visit. She has type 2 diabetes mellitus. Her disease course has been stable. Pertinent negatives for hypoglycemia include no dizziness or headaches. Pertinent negatives for diabetes include no blurred vision, no chest pain, no fatigue, no foot paresthesias and no visual change. She monitors blood glucose at home 3-4 x per week. Her breakfast blood glucose is taken between 7-8 am. Her breakfast blood glucose range is generally 130-140 mg/dl. An ACE inhibitor/angiotensin II receptor blocker is not being taken. Eye exam is current.  Hypertension  This is a chronic problem. The problem is controlled. Pertinent negatives include no blurred vision, chest pain, headaches, palpitations or shortness of breath. Identifiable causes of hypertension include a thyroid problem.  Thyroid Problem  Presents for follow-up visit. Patient reports no depressed mood, fatigue, leg swelling, palpitations or visual change. The symptoms have been stable.   Lab Results  Component Value Date   HGBA1C 6.4 (H) 03/23/2017     Review of Systems  Constitutional: Negative for chills, fatigue and fever.  Eyes: Negative for blurred vision.  Respiratory: Negative for cough, chest tightness, shortness of breath and wheezing.   Cardiovascular: Negative for chest pain and palpitations.  Musculoskeletal: Positive for arthralgias, gait problem and myalgias.  Skin: Negative for color change and rash.  Neurological: Negative for dizziness and headaches.  Psychiatric/Behavioral: Negative for dysphoric mood and sleep disturbance.    Patient Active Problem List   Diagnosis Date Noted  . Primary osteoarthritis of left hip 02/28/2017  . Primary osteoarthritis of left knee 02/28/2017  . Hyperlipidemia associated with type 2 diabetes mellitus ([place]) 03/05/2015  .  Biceps tendinitis 08/10/2014  . Essential (primary) hypertension 08/10/2014  . Hyperthyroidism 08/10/2014  . Uncontrolled type 2 diabetes mellitus without complication, without long-term current use of insulin ([place]) 08/10/2014    Prior to Admission medications   Medication Sig Start Date End Date Taking? Authorizing Provider  atorvastatin (LIPITOR) 10 MG tablet Take 1 tablet (10 mg total) by mouth daily. 03/23/17  Yes Glean Hess, MD  glimepiride (AMARYL) 2 MG tablet TAKE ONE TABLET BY MOUTH ONCE DAILY BEFORE  BREAKFAST 03/12/17  Yes Glean Hess, MD  ibuprofen (ADVIL,MOTRIN) 200 MG tablet Take 200 mg by mouth every 6 (six) hours as needed.   Yes [provider]  meloxicam (MOBIC) 15 MG tablet Take 15 mg by mouth daily.   Yes [provider]  metFORMIN (GLUCOPHAGE) 500 MG tablet TAKE ONE-HALF TABLET BY MOUTH TWICE DAILY 03/31/17  Yes Glean Hess, MD  methimazole (TAPAZOLE) 10 MG tablet Take 1 tablet (10 mg total) by mouth daily. 06/02/17  Yes Glean Hess, MD  metoprolol tartrate (LOPRESSOR) 25 MG tablet TAKE 1 TABLET BY MOUTH TWICE DAILY 06/14/17  Yes Glean Hess, MD  traMADol (ULTRAM) 50 MG tablet Take by mouth every 6 (six) hours as needed.   Yes [provider]    Allergies  Allergen Reactions  . Ace Inhibitors Nausea Only  . Wilder Glade [Dapagliflozin]     Vaginitis     Past Surgical History:  Procedure Laterality Date  . ABDOMINAL HYSTERECTOMY    . PARTIAL HYSTERECTOMY      Social History   Tobacco Use  . Smoking status: Former Smoker    Types: E-cigarettes  . Smokeless tobacco: Never Used  Substance  Use Topics  . Alcohol use: No    Alcohol/week: 0.0 oz  . Drug use: No     Medication list has been reviewed and updated.  PHQ 2/9 Scores 11/11/2016  PHQ - 2 Score 0    Physical Exam  Constitutional: She is oriented to person, place, and time. She appears well-developed. No distress.  HENT:  Head: Normocephalic and  atraumatic.  Neck: Normal range of motion. Neck supple.  Cardiovascular: Normal rate, regular rhythm and normal heart sounds.  Pulmonary/Chest: Effort normal and breath sounds normal. No respiratory distress.  Musculoskeletal:       Right knee: She exhibits decreased range of motion. Tenderness found.       Left knee: She exhibits decreased range of motion. Tenderness found.  Lymphadenopathy:    She has no cervical adenopathy.  Neurological: She is alert and oriented to person, place, and time.  Skin: Skin is warm and dry. No rash noted.  Psychiatric: She has a normal mood and affect. Her behavior is normal. Thought content normal.  Nursing note and vitals reviewed.   BP (!) 144/90   Pulse 62   Resp 16   Ht 5\' 3"  (1.6 m)   Wt 191 lb (86.6 kg)   SpO2 100%   BMI 33.83 kg/m   Assessment and Plan: 1. Uncontrolled type 2 diabetes mellitus without complication, without long-term current use of insulin (HCC) Continue current medications Check labs next visit when insurance issues settled  2. Essential (primary) hypertension Controlled fairly well - elevated today due to traffic issues  3. Primary osteoarthritis of left knee Using walker Unable to get surgery until insurance cleared   Meds ordered this encounter  Medications  . methimazole (TAPAZOLE) 10 MG tablet    Sig: Take 1 tablet (10 mg total) by mouth daily.    Dispense:  90 tablet    Refill:  1    Please consider 90 day supplies to promote better adherence  . atorvastatin (LIPITOR) 10 MG tablet    Sig: Take 1 tablet (10 mg total) by mouth daily.    Dispense:  90 tablet    Refill:  1    Partially dictated using Editor, commissioning. Any errors are unintentional.  Halina Maidens, MD [facility]  08/16/2017

## 2017-09-27 ENCOUNTER — Ambulatory Visit: Payer: Medicaid Other | Admitting: Specialist

## 2017-09-27 DIAGNOSIS — M25552 Pain in left hip: Secondary | ICD-10-CM

## 2017-09-27 NOTE — Progress Notes (Signed)
   Subjective:    Patient ID: Sarah [name], female    DOB: 11/17/1955, 62 y.o.   MRN: [id number]  HPI 62 yr old with a hx of OA left hip. She has been using a walker for about one year. She is on meloxicam.   Review of Systems Deffered    objective:   Physical Exam  Without the walker she has a left antalgic gait with a shorten stance phase.  ROM: on the rt she has 90 degrees of flexion and limited rotation. On the rt she will flex 90 degrees with limited rotation and on the left she will flex 90 degrees with no rotation. She has 30 degrees of ABD.       Assessment & Plan:  Plan: Her only option is to consider THA. We will be happy to refer to [facility] if she desires.

## 2017-10-25 ENCOUNTER — Encounter: Payer: Self-pay | Admitting: Internal Medicine

## 2017-10-25 ENCOUNTER — Ambulatory Visit: Payer: Medicaid Other | Admitting: Internal Medicine

## 2017-10-25 DIAGNOSIS — M1612 Unilateral primary osteoarthritis, left hip: Secondary | ICD-10-CM

## 2017-10-25 DIAGNOSIS — E1165 Type 2 diabetes mellitus with hyperglycemia: Principal | ICD-10-CM

## 2017-10-25 DIAGNOSIS — E0500 Thyrotoxicosis with diffuse goiter without thyrotoxic crisis or storm: Secondary | ICD-10-CM

## 2017-10-25 DIAGNOSIS — IMO0001 Reserved for inherently not codable concepts without codable children: Secondary | ICD-10-CM

## 2017-10-25 DIAGNOSIS — I10 Essential (primary) hypertension: Secondary | ICD-10-CM

## 2017-10-25 MED ORDER — METHIMAZOLE 10 MG PO TABS: 10.0000 mg | ORAL_TABLET | Freq: Every day | ORAL | 3 refills | Status: DC

## 2017-10-25 MED ORDER — ATORVASTATIN CALCIUM 10 MG PO TABS: 10.0000 mg | ORAL_TABLET | Freq: Every day | ORAL | 3 refills | Status: DC

## 2017-10-25 MED ORDER — IBUPROFEN 200 MG PO TABS: 200.0000 mg | ORAL_TABLET | Freq: Four times a day (QID) | ORAL | 3 refills | Status: DC | PRN

## 2017-10-25 MED ORDER — METOPROLOL TARTRATE 25 MG PO TABS: 25.0000 mg | ORAL_TABLET | Freq: Two times a day (BID) | ORAL | 12 refills | Status: DC

## 2017-10-25 MED ORDER — MELOXICAM 15 MG PO TABS: 15.0000 mg | ORAL_TABLET | Freq: Every day | ORAL | 3 refills | Status: DC

## 2017-10-25 MED ORDER — METFORMIN HCL 500 MG PO TABS: 250.0000 mg | ORAL_TABLET | Freq: Two times a day (BID) | ORAL | 2 refills | Status: DC

## 2017-10-25 MED ORDER — GLIMEPIRIDE 2 MG PO TABS: ORAL_TABLET | ORAL | 3 refills | Status: DC

## 2017-10-25 NOTE — Progress Notes (Signed)
  Subjective:    Patient ID: Sarah [name], female    DOB: May 07, 1955, 62 y.o.   MRN: [id number]  HPI  Pt has a history of diabetes, hyperthyroidism with goiter, hypertension and needs a L hip replacement.   Pt is working with Gaspar Cola to try and get a hip replacement surgery for a reduced cost; pt is working towards paying a $100 payment to continue with the process of getting the surgery.     Review of Systems   Patient Active Problem List   Diagnosis Date Noted  . Primary osteoarthritis of left hip 02/28/2017  . Primary osteoarthritis of left knee 02/28/2017  . Hyperlipidemia associated with type 2 diabetes mellitus ([place]) 03/05/2015  . Biceps tendinitis 08/10/2014  . Essential (primary) hypertension 08/10/2014  . Hyperthyroidism 08/10/2014  . Uncontrolled type 2 diabetes mellitus without complication, without long-term current use of insulin ([place]) 08/10/2014   Allergies as of 10/25/2017      Reactions   Ace Inhibitors Nausea Only   Farxiga [dapagliflozin]    Vaginitis      Medication List        Accurate as of 10/25/17 10:35 AM. Always use your most recent med list.          atorvastatin 10 MG tablet Commonly known as:  LIPITOR Take 1 tablet (10 mg total) by mouth daily.   glimepiride 2 MG tablet Commonly known as:  AMARYL TAKE ONE TABLET BY MOUTH ONCE DAILY BEFORE  BREAKFAST   ibuprofen 200 MG tablet Commonly known as:  ADVIL,MOTRIN Take 200 mg by mouth every 6 (six) hours as needed.   meloxicam 15 MG tablet Commonly known as:  MOBIC Take 15 mg by mouth daily.   metFORMIN 500 MG tablet Commonly known as:  GLUCOPHAGE TAKE ONE-HALF TABLET BY MOUTH TWICE DAILY   methimazole 10 MG tablet Commonly known as:  TAPAZOLE Take 1 tablet (10 mg total) by mouth daily.   metoprolol tartrate 25 MG tablet Commonly known as:  LOPRESSOR TAKE 1 TABLET BY MOUTH TWICE DAILY   traMADol 50 MG tablet Commonly known as:  ULTRAM Take by mouth every 6 (six) hours  as needed.           Objective:   Physical Exam  Constitutional: She is oriented to person, place, and time.  Cardiovascular: Normal rate, regular rhythm and normal heart sounds.  Pulmonary/Chest: Effort normal and breath sounds normal.  Neurological: She is alert and oriented to person, place, and time.   Neck exam shows prominent goiter with poss cyst in R lobe. No radiographic studies available for review (? not sure if any have been done). Currently on thyroid suppression medication but will wait on endocrine suggestion for appropriate diagnostic evaluation.     Assessment & Plan:   Referral to [facility] as soon as possible for medication assessment for diabetes, and hyperthyroidism with goiter.   Continue medications as prescribed. Pt referred to Medication Management for prescriptions.   FU in 3 months with labs a week prior

## 2017-10-26 LAB — HEMOGLOBIN A1C
Est. average glucose Bld gHb Est-mCnc: 189 mg/dL
HEMOGLOBIN A1C: 8.2 % — AB (ref 4.8–5.6)

## 2017-10-26 LAB — COMPREHENSIVE METABOLIC PANEL
A/G RATIO: 1.4 (ref 1.2–2.2)
ALK PHOS: 116 IU/L (ref 39–117)
ALT: 9 IU/L (ref 0–32)
AST: 11 IU/L (ref 0–40)
Albumin: 4.3 g/dL (ref 3.6–4.8)
BUN/Creatinine Ratio: 13 (ref 12–28)
BUN: 11 mg/dL (ref 8–27)
Bilirubin Total: 0.5 mg/dL (ref 0.0–1.2)
CALCIUM: 9.4 mg/dL (ref 8.7–10.3)
CO2: 22 mmol/L (ref 20–29)
CREATININE: 0.86 mg/dL (ref 0.57–1.00)
Chloride: 106 mmol/L (ref 96–106)
GFR calc Af Amer: 85 mL/min/{1.73_m2} (ref >59)
GFR, EST NON AFRICAN AMERICAN: 74 mL/min/{1.73_m2} (ref >59)
GLOBULIN, TOTAL: 3 g/dL (ref 1.5–4.5)
Glucose: 220 mg/dL — ABNORMAL HIGH (ref 65–99)
POTASSIUM: 3.5 mmol/L (ref 3.5–5.2)
SODIUM: 144 mmol/L (ref 134–144)
Total Protein: 7.3 g/dL (ref 6.0–8.5)

## 2017-10-26 LAB — CBC
Hematocrit: 35.6 % (ref 34.0–46.6)
Hemoglobin: 11.9 g/dL (ref 11.1–15.9)
MCH: 28.3 pg (ref 26.6–33.0)
MCHC: 33.4 g/dL (ref 31.5–35.7)
MCV: 85 fL (ref 79–97)
PLATELETS: 151 10*3/uL (ref 150–450)
RBC: 4.2 x10E6/uL (ref 3.77–5.28)
RDW: 13.3 % (ref 12.3–15.4)
WBC: 8.5 10*3/uL (ref 3.4–10.8)

## 2017-10-26 LAB — LIPID PANEL
CHOL/HDL RATIO: 5.1 ratio — AB (ref 0.0–4.4)
CHOLESTEROL TOTAL: 157 mg/dL (ref 100–199)
HDL: 31 mg/dL — ABNORMAL LOW (ref >39)
LDL Calculated: 80 mg/dL (ref 0–99)
TRIGLYCERIDES: 229 mg/dL — AB (ref 0–149)
VLDL CHOLESTEROL CAL: 46 mg/dL — AB (ref 5–40)

## 2017-10-26 LAB — T4 AND TSH
T4, Total: 5.9 ug/dL (ref 4.5–12.0)
TSH: 1.52 u[IU]/mL (ref 0.450–4.500)

## 2017-11-09 ENCOUNTER — Telehealth: Payer: Self-pay

## 2017-11-09 NOTE — Telephone Encounter (Signed)
I called Ms. Klinkner cell [phone] the phone did not allow incoming calls.  I also called [phone] and the voice mail had not been set up yet.  Dr. Mable Fill wanted her to see Endocrinology the appointment is 12/12/17 @ 6PM and he wants her to increase her Metformin to 500 mg twice a day from 250 mg twice a day bc of her A1C increasing from  6.4 to 8.2.  I will try patient again.

## 2017-11-09 NOTE — Telephone Encounter (Signed)
SW patient she is now aware of her appointment and dose change. [place]

## 2017-11-15 ENCOUNTER — Ambulatory Visit: Payer: PRIVATE HEALTH INSURANCE | Admitting: Pharmacist

## 2017-11-15 ENCOUNTER — Other Ambulatory Visit: Payer: Self-pay

## 2017-11-15 ENCOUNTER — Ambulatory Visit: Payer: PRIVATE HEALTH INSURANCE | Admitting: Pharmacy Technician

## 2017-11-15 ENCOUNTER — Encounter: Payer: Self-pay | Admitting: Pharmacist

## 2017-11-15 ENCOUNTER — Encounter (INDEPENDENT_AMBULATORY_CARE_PROVIDER_SITE_OTHER): Payer: Self-pay

## 2017-11-15 VITALS — BP 128/70

## 2017-11-15 DIAGNOSIS — Z79899 Other long term (current) drug therapy: Secondary | ICD-10-CM

## 2017-11-15 NOTE — Progress Notes (Signed)
[facility] Visit Note  Patient: Jolinda Pinkstaff MRN: [id number] Date of Birth: 02-13-1956 PCP: Tawni Millers, MD   Alean Rinne Fanguy 62 y.o. female presents for an initial MTM visit today after eligibility appt w/Betty. Primary objective of today's visit is to reconcile medications.  BP 128/70 (BP Location: Right Arm, Patient Position: Sitting, Cuff Size: Normal) Comment: 128 70  Patient Information  No past medical history on file.    Past Surgical History:  Procedure Laterality Date  . ABDOMINAL HYSTERECTOMY    . PARTIAL HYSTERECTOMY       Family History  Problem Relation Age of Onset  . Diabetes Mother   . Diabetes Sister   . Breast cancer Neg Hx     New Diagnoses (since last visit): n/a  Family Support: good, boyfried at home helps  Lifestyle Diet: Breakfast:cereal, frosted flakes w/2% milk, fruit Lunch:salad, veggies, [place] sandwich on white wheat, mustard Dinner:greens, snap beans, some corn or rice Drinks: water, soda (once daily), gatorade (once daily), sweet tea, coffe w/low-fat creamer *only eats when hungry           Social History   Substance and Sexual Activity  Alcohol Use No  . Alcohol/week: 0.0 standard drinks      Social History   Tobacco Use  Smoking Status Former Smoker  . Types: Cigarettes  Smokeless Tobacco Never Used      Health Maintenance  Topic Date Due  . COLONOSCOPY  03/02/2007  . OPHTHALMOLOGY EXAM  01/26/2017  . INFLUENZA VACCINE  10/26/2017  . FOOT EXAM  03/23/2018  . URINE MICROALBUMIN  03/23/2018  . HEMOGLOBIN A1C  04/27/2018  . MAMMOGRAM  08/01/2018  . TETANUS/TDAP  06/26/2021  . PNEUMOCOCCAL POLYSACCHARIDE VACCINE AGE 62-64 HIGH RISK  Completed  . Hepatitis C Screening  Completed  . HIV Screening  Completed   Outpatient Encounter Medications as of 11/15/2017  Medication Sig  . atorvastatin (LIPITOR) 10 MG tablet Take 1 tablet (10 mg total) by mouth daily.  Marland Kitchen glimepiride (AMARYL)  2 MG tablet TAKE ONE TABLET BY MOUTH ONCE DAILY BEFORE  BREAKFAST  . ibuprofen (ADVIL,MOTRIN) 200 MG tablet Take 1 tablet (200 mg total) by mouth every 6 (six) hours as needed.  . meloxicam (MOBIC) 15 MG tablet Take 1 tablet (15 mg total) by mouth daily.  . metFORMIN (GLUCOPHAGE) 500 MG tablet Take 500 mg by mouth 2 (two) times daily with a meal.  . methimazole (TAPAZOLE) 10 MG tablet Take 1 tablet (10 mg total) by mouth daily.  . metoprolol tartrate (LOPRESSOR) 25 MG tablet Take 1 tablet (25 mg total) by mouth 2 (two) times daily.  . [DISCONTINUED] metFORMIN (GLUCOPHAGE) 500 MG tablet Take 0.5 tablets (250 mg total) by mouth 2 (two) times daily.  . [DISCONTINUED] traMADol (ULTRAM) 50 MG tablet Take by mouth every 6 (six) hours as needed.   No facility-administered encounter medications on file as of 11/15/2017.     Assessment and Plan:   Compliance/Adherance: pt knows names, indications, and frequency of all her medications with very little to no prompting. She states she rarely misses doses.   HTN: BP today of 128/70 with goal of 130/80. Pt is only on metoprolol 25mg  BID. Would suggest addition of ACE inh for kidney protection since pt also has DM, but pt states she can not take them due to nausea. Will suggest provider consider adding low dose ARB for kidney protection.  DM: recent A1c from 10/25/17 was 8.2 which is trending up  from 6.8 in Dec 2018. Explained what A1c is and that goal would be < 7.0. She states she does not check BGs since she can not afford strips. She recognizes that she has had low BG in the past and can recognizes the symptoms when they occur. She knows how to handle them with OJ or a piece of candy. She states that is happens rarely, maybe once in several months. She is taking Metformin which has recently been increased from 250mg  BID to 500mg  BID. We spoke at length about dietary changes such as switching to a lower sugar cereal that contains more fiber such as raisin bran  and the pt was agreeable and we made this a goal. We also discussed how much the sugary beverages could be contributing to higher BG values and A1c. Pt was agreeable to gradually decrease intake of soda/gatorade/sweet tea with an eventual goal of once daily.    RTC 6 mos to re-evaluate goals, may need to consider increase of metformin.  Netta Neat, PharmD, [facility]) [phone]

## 2017-11-15 NOTE — Progress Notes (Signed)
Assisted patient with the completioin of financial application for [facility].  Patient agreed to be responsible for gathering financial information and forwarding to appropriate department in [facility].    Completed [facility] application and contract.  Patient agreed to all terms of the [facility] contract.    Patient approved to receive medication assistance as long as eligibility criteria continues to be met.    Provided patient with community resource material based on her particular needs.    Referred patient for MTM.  [facility]

## 2017-12-05 ENCOUNTER — Encounter: Payer: Self-pay | Admitting: Family Medicine

## 2017-12-05 ENCOUNTER — Ambulatory Visit: Payer: Medicaid Other | Admitting: Family Medicine

## 2017-12-05 VITALS — BP 154/82 | HR 70 | Temp 98.2°F | Ht 60.1 in | Wt 190.4 lb

## 2017-12-05 DIAGNOSIS — Z720 Tobacco use: Secondary | ICD-10-CM

## 2017-12-05 DIAGNOSIS — M25552 Pain in left hip: Secondary | ICD-10-CM

## 2017-12-05 DIAGNOSIS — L03012 Cellulitis of left finger: Secondary | ICD-10-CM

## 2017-12-05 DIAGNOSIS — G8929 Other chronic pain: Secondary | ICD-10-CM

## 2017-12-05 DIAGNOSIS — M545 Low back pain: Secondary | ICD-10-CM

## 2017-12-05 DIAGNOSIS — M79645 Pain in left finger(s): Secondary | ICD-10-CM

## 2017-12-05 DIAGNOSIS — Z09 Encounter for follow-up examination after completed treatment for conditions other than malignant neoplasm: Secondary | ICD-10-CM

## 2017-12-05 MED ORDER — CEPHALEXIN 500 MG PO CAPS: 500.0000 mg | ORAL_CAPSULE | Freq: Three times a day (TID) | ORAL | 0 refills | Status: DC

## 2017-12-05 MED ORDER — IBUPROFEN 800 MG PO TABS: 800.0000 mg | ORAL_TABLET | Freq: Three times a day (TID) | ORAL | 2 refills | Status: DC | PRN

## 2017-12-05 NOTE — Progress Notes (Signed)
Follow Up  Subjective:    Patient ID: Sarah [name], female    DOB: 1956/03/20, 62 y.o.   MRN: [id number]    Chief Complaint  Patient presents with  . Wound Infection    possible wound infection left middle digit x 3 days   HPI  [name] is a 62 year old female with a past medical history of Left Hip Pain and Diabetes. He is here today for follow up.   Current Status: Since her last office visit, her left finger has gotten infected, with pain x 3 days. Previous surgery in 1988's. She rates her pain 8/10. She has not had any trauma to her finger, but she did cut her fingernail too close and accidentally nicked her skin. A few days later skin around her finger began to show signs of pain, redness and swelling. She denies fevers, chills, fatigue, weight loss, and night sweats.   She is taking Mobic for left hip pain. Her pain is 3/10 today. She has follow up appointment with Orthopedics 12/2017. She ambulates with a cane.   She continues to work on controlling her eating habits and diabetes. She is scheduled for Diabetic teaching at our clinic 12/12/2017.She denies increased thirst, f requent urination, hunger, fatigue, blurred vision, excessive hunger, excessive thirst, weight gain, weight loss, and poor wound healing.   She has not had any headaches, visual changes, dizziness, and falls. No chest pain, heart palpitations, cough and shortness of breath reported. No reports of GI problems such as nausea, vomiting, diarrhea, and constipation. She has no reports of blood in stools, dysuria and hematuria. No depression or anxiety reported.   No past medical history on file.  Family History  Problem Relation Age of Onset  . Diabetes Mother   . Diabetes Sister   . Breast cancer Neg Hx     Social History   Socioeconomic History  . Marital status: Single    Spouse name: Not on file  . Number of children: Not on file  . Years of education: Not on file  . Highest education level:  Not on file  Occupational History  . Not on file  Social Needs  . Financial resource strain: Not very hard  . Food insecurity:    Worry: Never true    Inability: Never true  . Transportation needs:    Medical: No    Non-medical: No  Tobacco Use  . Smoking status: Current Some Day Smoker    Types: Cigarettes  . Smokeless tobacco: Never Used  Substance and Sexual Activity  . Alcohol use: No    Alcohol/week: 0.0 standard drinks  . Drug use: No  . Sexual activity: Not on file  Lifestyle  . Physical activity:    Days per week: 4 days    Minutes per session: 10 min  . Stress: Not at all  Relationships  . Social connections:    Talks on phone: More than three times a week    Gets together: More than three times a week    Attends religious service: More than 4 times per year    Active member of club or organization: No    Attends meetings of clubs or organizations: Never    Relationship status: Not on file  . Intimate partner violence:    Fear of current or ex partner: No    Emotionally abused: No    Physically abused: No    Forced sexual activity: No  Other Topics Concern  .  Not on file  Social History Narrative   - pt not interested in participating    No consent form signed      Pt is on food stamps, her boyfriend is too    Past Surgical History:  Procedure Laterality Date  . ABDOMINAL HYSTERECTOMY    . PARTIAL HYSTERECTOMY      Immunization History  Administered Date(s) Administered  . Influenza-Unspecified 01/27/2015, 01/26/2017  . Pneumococcal Polysaccharide-23 01/26/2013  . Tdap 06/27/2011    Current Meds  Medication Sig  . atorvastatin (LIPITOR) 10 MG tablet Take 1 tablet (10 mg total) by mouth daily.  Marland Kitchen glimepiride (AMARYL) 2 MG tablet TAKE ONE TABLET BY MOUTH ONCE DAILY BEFORE  BREAKFAST  . meloxicam (MOBIC) 15 MG tablet Take 1 tablet (15 mg total) by mouth daily.  . metFORMIN (GLUCOPHAGE) 500 MG tablet Take 500 mg by mouth 2 (two) times daily with a  meal.  . methimazole (TAPAZOLE) 10 MG tablet Take 1 tablet (10 mg total) by mouth daily.  . metoprolol tartrate (LOPRESSOR) 25 MG tablet Take 1 tablet (25 mg total) by mouth 2 (two) times daily.   Allergies  Allergen Reactions  . Ace Inhibitors Nausea Only  . Wilder Glade [Dapagliflozin]     Vaginitis     BP (!) 154/82   Pulse 70   Temp 98.2 F (36.8 C)   Ht 5' 0.1" (1.527 m)   Wt 190 lb 6.4 oz (86.4 kg)   BMI 37.06 kg/m    Review of Systems  Constitutional: Negative.   HENT: Negative.   Eyes: Negative.   Respiratory: Negative.   Cardiovascular: Negative.   Endocrine: Negative.   Genitourinary: Negative.   Musculoskeletal: Positive for arthralgias (chronic left hip pain).  Skin: Negative.   Allergic/Immunologic: Negative.   Neurological: Negative.   Hematological: Negative.     Objective:   Physical Exam  Constitutional: She is oriented to person, place, and time. She appears well-developed and well-nourished.  HENT:  Head: Normocephalic and atraumatic.  Right Ear: External ear normal.  Left Ear: External ear normal.  Nose: Nose normal.  Mouth/Throat: Oropharynx is clear and moist.  Eyes: Pupils are equal, round, and reactive to light. Conjunctivae and EOM are normal.  Neck: Normal range of motion. Neck supple.  Cardiovascular: Normal rate, regular rhythm, normal heart sounds and intact distal pulses.  Pulmonary/Chest: Effort normal and breath sounds normal.  Abdominal: Soft. Bowel sounds are normal.  Musculoskeletal: Normal range of motion.  Neurological: She is alert and oriented to person, place, and time.  Skin: Skin is warm and dry. Capillary refill takes less than 2 seconds. There is erythema (left finger, swelling).  Psychiatric: She has a normal mood and affect. Her behavior is normal. Judgment and thought content normal.  Nursing note and vitals reviewed.  Assessment & Plan:   1. Finger pain, left Antibiotic for Keflex to pharmacy today.  - cephALEXin  (KEFLEX) 500 MG capsule; Take 1 capsule (500 mg total) by mouth 3 (three) times daily for 10 days.  Dispense: 30 capsule; Refill: 0 - ibuprofen (ADVIL,MOTRIN) 800 MG tablet; Take 1 tablet (800 mg total) by mouth every 8 (eight) hours as needed.  Dispense: 30 tablet; Refill: 2  2. Cellulitis of left index finger - cephALEXin (KEFLEX) 500 MG capsule; Take 1 capsule (500 mg total) by mouth 3 (three) times daily for 10 days.  Dispense: 30 capsule; Refill: 0 - ibuprofen (ADVIL,MOTRIN) 800 MG tablet; Take 1 tablet (800 mg total) by mouth every 8 (  eight) hours as needed.  Dispense: 30 tablet; Refill: 2  3. Left hip pain  4. Chronic bilateral low back pain without sciatica Continue Motrin as prescribed.   5. Tobacco use She continues to smoke. Will call office when she is ready for smoking cessation.   6. Follow up She will follow up in 1 week for re-assessment of skin infection to finger.   Meds ordered this encounter  Medications  . cephALEXin (KEFLEX) 500 MG capsule    Sig: Take 1 capsule (500 mg total) by mouth 3 (three) times daily for 10 days.    Dispense:  30 capsule    Refill:  0  . ibuprofen (ADVIL,MOTRIN) 800 MG tablet    Sig: Take 1 tablet (800 mg total) by mouth every 8 (eight) hours as needed.    Dispense:  30 tablet    Refill:  2    Kathe Becton,  MSN, FNP-C [facility] [address] [place], [place] [address] [phone]

## 2017-12-05 NOTE — Patient Instructions (Signed)
DASH Eating Plan DASH stands for "Dietary Approaches to Stop Hypertension." The DASH eating plan is a healthy eating plan that has been shown to reduce high blood pressure (hypertension). It may also reduce your risk for type 2 diabetes, heart disease, and stroke. The DASH eating plan may also help with weight loss. What are tips for following this plan? General guidelines  Avoid eating more than 2,300 mg (milligrams) of salt (sodium) a day. If you have hypertension, you may need to reduce your sodium intake to 1,500 mg a day.  Limit alcohol intake to no more than 1 drink a day for nonpregnant women and 2 drinks a day for men. One drink equals 12 oz of beer, 5 oz of wine, or 1 oz of hard liquor.  Work with your health care provider to maintain a healthy body weight or to lose weight. Ask what an ideal weight is for you.  Get at least 30 minutes of exercise that causes your heart to beat faster (aerobic exercise) most days of the week. Activities may include walking, swimming, or biking.  Work with your health care provider or diet and nutrition specialist (dietitian) to adjust your eating plan to your individual calorie needs. Reading food labels  Check food labels for the amount of sodium per serving. Choose foods with less than 5 percent of the Daily Value of sodium. Generally, foods with less than 300 mg of sodium per serving fit into this eating plan.  To find whole grains, look for the word "whole" as the first word in the ingredient list. Shopping  Buy products labeled as "low-sodium" or "no salt added."  Buy fresh foods. Avoid canned foods and premade or frozen meals. Cooking  Avoid adding salt when cooking. Use salt-free seasonings or herbs instead of table salt or sea salt. Check with your health care provider or pharmacist before using salt substitutes.  Do not fry foods. Cook foods using healthy methods such as baking, boiling, grilling, and broiling instead.  Cook with  heart-healthy oils, such as olive, canola, soybean, or sunflower oil. Meal planning   Eat a balanced diet that includes: ? 5 or more servings of fruits and vegetables each day. At each meal, try to fill half of your plate with fruits and vegetables. ? Up to 6-8 servings of whole grains each day. ? Less than 6 oz of lean meat, poultry, or fish each day. A 3-oz serving of meat is about the same size as a deck of cards. One egg equals 1 oz. ? 2 servings of low-fat dairy each day. ? A serving of nuts, seeds, or beans 5 times each week. ? Heart-healthy fats. Healthy fats called Omega-3 fatty acids are found in foods such as flaxseeds and coldwater fish, like sardines, salmon, and mackerel.  Limit how much you eat of the following: ? Canned or prepackaged foods. ? Food that is high in trans fat, such as fried foods. ? Food that is high in saturated fat, such as fatty meat. ? Sweets, desserts, sugary drinks, and other foods with added sugar. ? Full-fat dairy products.  Do not salt foods before eating.  Try to eat at least 2 vegetarian meals each week.  Eat more home-cooked food and less restaurant, buffet, and fast food.  When eating at a restaurant, ask that your food be prepared with less salt or no salt, if possible. What foods are recommended? The items listed may not be a complete list. Talk with your dietitian about what   dietary choices are best for you. Grains Whole-grain or whole-wheat bread. Whole-grain or whole-wheat pasta. Brown rice. Oatmeal. Quinoa. Bulgur. Whole-grain and low-sodium cereals. Pita bread. Low-fat, low-sodium crackers. Whole-wheat flour tortillas. Vegetables Fresh or frozen vegetables (raw, steamed, roasted, or grilled). Low-sodium or reduced-sodium tomato and vegetable juice. Low-sodium or reduced-sodium tomato sauce and tomato paste. Low-sodium or reduced-sodium canned vegetables. Fruits All fresh, dried, or frozen fruit. Canned fruit in natural juice (without  added sugar). Meat and other protein foods Skinless chicken or turkey. Ground chicken or turkey. Pork with fat trimmed off. Fish and seafood. Egg whites. Dried beans, peas, or lentils. Unsalted nuts, nut butters, and seeds. Unsalted canned beans. Lean cuts of beef with fat trimmed off. Low-sodium, lean deli meat. Dairy Low-fat (1%) or fat-free (skim) milk. Fat-free, low-fat, or reduced-fat cheeses. Nonfat, low-sodium ricotta or cottage cheese. Low-fat or nonfat yogurt. Low-fat, low-sodium cheese. Fats and oils Soft margarine without trans fats. Vegetable oil. Low-fat, reduced-fat, or light mayonnaise and salad dressings (reduced-sodium). Canola, safflower, olive, soybean, and sunflower oils. Avocado. Seasoning and other foods Herbs. Spices. Seasoning mixes without salt. Unsalted popcorn and pretzels. Fat-free sweets. What foods are not recommended? The items listed may not be a complete list. Talk with your dietitian about what dietary choices are best for you. Grains Baked goods made with fat, such as croissants, muffins, or some breads. Dry pasta or rice meal packs. Vegetables Creamed or fried vegetables. Vegetables in a cheese sauce. Regular canned vegetables (not low-sodium or reduced-sodium). Regular canned tomato sauce and paste (not low-sodium or reduced-sodium). Regular tomato and vegetable juice (not low-sodium or reduced-sodium). Pickles. Olives. Fruits Canned fruit in a light or heavy syrup. Fried fruit. Fruit in cream or butter sauce. Meat and other protein foods Fatty cuts of meat. Ribs. Fried meat. Bacon. Sausage. Bologna and other processed lunch meats. Salami. Fatback. Hotdogs. Bratwurst. Salted nuts and seeds. Canned beans with added salt. Canned or smoked fish. Whole eggs or egg yolks. Chicken or turkey with skin. Dairy Whole or 2% milk, cream, and half-and-half. Whole or full-fat cream cheese. Whole-fat or sweetened yogurt. Full-fat cheese. Nondairy creamers. Whipped toppings.  Processed cheese and cheese spreads. Fats and oils Butter. Stick margarine. Lard. Shortening. Ghee. Bacon fat. Tropical oils, such as coconut, palm kernel, or palm oil. Seasoning and other foods Salted popcorn and pretzels. Onion salt, garlic salt, seasoned salt, table salt, and sea salt. Worcestershire sauce. Tartar sauce. Barbecue sauce. Teriyaki sauce. Soy sauce, including reduced-sodium. Steak sauce. Canned and packaged gravies. Fish sauce. Oyster sauce. Cocktail sauce. Horseradish that you find on the shelf. Ketchup. Mustard. Meat flavorings and tenderizers. Bouillon cubes. Hot sauce and Tabasco sauce. Premade or packaged marinades. Premade or packaged taco seasonings. Relishes. Regular salad dressings. Where to find more information:  National Heart, Lung, and Blood Institute: www.nhlbi.nih.gov  American Heart Association: www.heart.org Summary  The DASH eating plan is a healthy eating plan that has been shown to reduce high blood pressure (hypertension). It may also reduce your risk for type 2 diabetes, heart disease, and stroke.  With the DASH eating plan, you should limit salt (sodium) intake to 2,300 mg a day. If you have hypertension, you may need to reduce your sodium intake to 1,500 mg a day.  When on the DASH eating plan, aim to eat more fresh fruits and vegetables, whole grains, lean proteins, low-fat dairy, and heart-healthy fats.  Work with your health care provider or diet and nutrition specialist (dietitian) to adjust your eating plan to your individual   calorie needs. This information is not intended to replace advice given to you by your health care provider. Make sure you discuss any questions you have with your health care provider. Document Released: 03/03/2011 Document Revised: 03/07/2016 Document Reviewed: 03/07/2016 Elsevier Interactive Patient Education  2018 [place] Heart-healthy meal planning includes:  Limiting unhealthy  fats.  Increasing healthy fats.  Making other small dietary changes.  You may need to talk with your doctor or a diet specialist (dietitian) to create an eating plan that is right for you. What types of fat should I choose?  Choose healthy fats. These include olive oil and canola oil, flaxseeds, walnuts, almonds, and seeds.  Eat more omega-3 fats. These include salmon, mackerel, sardines, tuna, flaxseed oil, and ground flaxseeds. Try to eat fish at least twice each week.  Limit saturated fats. ? Saturated fats are often found in animal products, such as meats, butter, and cream. ? Plant sources of saturated fats include palm oil, palm kernel oil, and coconut oil.  Avoid foods with partially hydrogenated oils in them. These include stick margarine, some tub margarines, cookies, crackers, and other baked goods. These contain trans fats. What general guidelines do I need to follow?  Check food labels carefully. Identify foods with trans fats or high amounts of saturated fat.  Fill one half of your plate with vegetables and green salads. Eat 4-5 servings of vegetables per day. A serving of vegetables is: ? 1 cup of raw leafy vegetables. ?  cup of raw or cooked cut-up vegetables. ?  cup of vegetable juice.  Fill one fourth of your plate with whole grains. Look for the word "whole" as the first word in the ingredient list.  Fill one fourth of your plate with lean protein foods.  Eat 4-5 servings of fruit per day. A serving of fruit is: ? One medium whole fruit. ?  cup of dried fruit. ?  cup of fresh, frozen, or canned fruit. ?  cup of 100% fruit juice.  Eat more foods that contain soluble fiber. These include apples, broccoli, carrots, beans, peas, and barley. Try to get 20-30 g of fiber per day.  Eat more home-cooked food. Eat less restaurant, buffet, and fast food.  Limit or avoid alcohol.  Limit foods high in starch and sugar.  Avoid fried foods.  Avoid frying your  food. Try baking, boiling, grilling, or broiling it instead. You can also reduce fat by: ? Removing the skin from poultry. ? Removing all visible fats from meats. ? Skimming the fat off of stews, soups, and gravies before serving them. ? Steaming vegetables in water or broth.  Lose weight if you are overweight.  Eat 4-5 servings of nuts, legumes, and seeds per week: ? One serving of dried beans or legumes equals  cup after being cooked. ? One serving of nuts equals 1 ounces. ? One serving of seeds equals  ounce or one tablespoon.  You may need to keep track of how much salt or sodium you eat. This is especially true if you have high blood pressure. Talk with your doctor or dietitian to get more information. What foods can I eat? Grains Breads, including [place], white, pita, wheat, raisin, rye, oatmeal, and [place]. Tortillas that are neither fried nor made with lard or trans fat. Low-fat rolls, including hotdog and hamburger buns and English muffins. Biscuits. Muffins. Waffles. Pancakes. Light popcorn. Whole-grain cereals. Flatbread. Melba toast. Pretzels. Breadsticks. Rusks. Low-fat snacks. Low-fat crackers, including oyster, saltine,  matzo, graham, animal, and rye. Rice and pasta, including brown rice and pastas that are made with whole wheat. Vegetables All vegetables. Fruits All fruits, but limit coconut. Meats and Other Protein Sources Lean, well-trimmed beef, veal, pork, and lamb. Chicken and [place] without skin. All fish and shellfish. Wild duck, rabbit, pheasant, and venison. Egg whites or low-cholesterol egg substitutes. Dried beans, peas, lentils, and tofu. Seeds and most nuts. Dairy Low-fat or nonfat cheeses, including ricotta, string, and mozzarella. Skim or 1% milk that is liquid, powdered, or evaporated. Buttermilk that is made with low-fat milk. Nonfat or low-fat yogurt. Beverages Mineral water. Diet carbonated beverages. Sweets and Desserts Sherbets and fruit ices.  Honey, jam, marmalade, jelly, and syrups. Meringues and gelatins. Pure sugar candy, such as hard candy, jelly beans, gumdrops, mints, marshmallows, and small amounts of dark chocolate. [employer] Eat all sweets and desserts in moderation. Fats and Oils Nonhydrogenated (trans-free) margarines. Vegetable oils, including soybean, sesame, sunflower, olive, peanut, safflower, corn, canola, and cottonseed. Salad dressings or mayonnaise made with a vegetable oil. Limit added fats and oils that you use for cooking, baking, salads, and as spreads. Other Cocoa powder. Coffee and tea. All seasonings and condiments. The items listed above may not be a complete list of recommended foods or beverages. Contact your dietitian for more options. What foods are not recommended? Grains Breads that are made with saturated or trans fats, oils, or whole milk. Croissants. Butter rolls. Cheese breads. Sweet rolls. Donuts. Buttered popcorn. Chow mein noodles. High-fat crackers, such as cheese or butter crackers. Meats and Other Protein Sources Fatty meats, such as hotdogs, short ribs, sausage, spareribs, bacon, rib eye roast or steak, and mutton. High-fat deli meats, such as salami and bologna. Caviar. Domestic duck and goose. Organ meats, such as kidney, liver, sweetbreads, and heart. Dairy Cream, sour cream, cream cheese, and creamed cottage cheese. Whole-milk cheeses, including blue (bleu), Monterey Jack, [place], [place], American, Ama, Swiss, cheddar, [place], and [place]. Whole or 2% milk that is liquid, evaporated, or condensed. Whole buttermilk. Cream sauce or high-fat cheese sauce. Yogurt that is made from whole milk. Beverages Regular sodas and juice drinks with added sugar. Sweets and Desserts Frosting. Pudding. Cookies. Cakes other than angel food cake. Candy that has milk chocolate or white chocolate, hydrogenated fat, butter, coconut, or unknown ingredients. Buttered syrups. Full-fat ice cream or ice  cream drinks. Fats and Oils Gravy that has suet, meat fat, or shortening. Cocoa butter, hydrogenated oils, palm oil, coconut oil, palm kernel oil. These can often be found in baked products, candy, fried foods, nondairy creamers, and whipped toppings. Solid fats and shortenings, including bacon fat, salt pork, lard, and butter. Nondairy cream substitutes, such as coffee creamers and sour cream substitutes. Salad dressings that are made of unknown oils, cheese, or sour cream. The items listed above may not be a complete list of foods and beverages to avoid. Contact your dietitian for more information. This information is not intended to replace advice given to you by your health care provider. Make sure you discuss any questions you have with your health care provider. Document Released: 09/13/2011 Document Revised: 08/20/2015 Document Reviewed: 09/05/2013 Elsevier Interactive Patient Education  2018 [employer]. Cephalexin tablets or capsules What is this medicine? CEPHALEXIN (sef a LEX in) is a cephalosporin antibiotic. It is used to treat certain kinds of bacterial infections It will not work for colds, flu, or other viral infections. This medicine may be used for other purposes; ask your health care provider  or pharmacist if you have questions. COMMON BRAND NAME(S): Biocef, Daxbia, Keflex, Keftab What should I tell my health care provider before I take this medicine? They need to know if you have any of these conditions: -kidney disease -stomach or intestine problems, especially colitis -an unusual or allergic reaction to cephalexin, other cephalosporins, penicillins, other antibiotics, medicines, foods, dyes or preservatives -pregnant or trying to get pregnant -breast-feeding How should I use this medicine? Take this medicine by mouth with a full glass of water. Follow the directions on the prescription label. This medicine can be taken with or without food. Take your medicine at regular  intervals. Do not take your medicine more often than directed. Take all of your medicine as directed even if you think you are better. Do not skip doses or stop your medicine early. Talk to your pediatrician regarding the use of this medicine in children. While this drug may be prescribed for selected conditions, precautions do apply. Overdosage: If you think you have taken too much of this medicine contact a poison control center or emergency room at once. NOTE: This medicine is only for you. Do not share this medicine with others. What if I miss a dose? If you miss a dose, take it as soon as you can. If it is almost time for your next dose, take only that dose. Do not take double or extra doses. There should be at least 4 to 6 hours between doses. What may interact with this medicine? -probenecid -some other antibiotics This list may not describe all possible interactions. Give your health care provider a list of all the medicines, herbs, non-prescription drugs, or dietary supplements you use. Also tell them if you smoke, drink alcohol, or use illegal drugs. Some items may interact with your medicine. What should I watch for while using this medicine? Tell your doctor or health care professional if your symptoms do not begin to improve in a few days. Do not treat diarrhea with over the counter products. Contact your doctor if you have diarrhea that lasts more than 2 days or if it is severe and watery. If you have diabetes, you may get a false-positive result for sugar in your urine. Check with your doctor or health care professional. What side effects may I notice from receiving this medicine? Side effects that you should report to your doctor or health care professional as soon as possible: -allergic reactions like skin rash, itching or hives, swelling of the face, lips, or tongue -breathing problems -pain or trouble passing urine -redness, blistering, peeling or loosening of the skin,  including inside the mouth -severe or watery diarrhea -unusually weak or tired -yellowing of the eyes, skin Side effects that usually do not require medical attention (report to your doctor or health care professional if they continue or are bothersome): -gas or heartburn -genital or anal irritation -headache -joint or muscle pain -nausea, vomiting This list may not describe all possible side effects. Call your doctor for medical advice about side effects. You may report side effects to FDA at 1-800-FDA-1088. Where should I keep my medicine? Keep out of the reach of children. Store at room temperature between 59 and 86 degrees F (15 and 30 degrees C). Throw away any unused medicine after the expiration date. NOTE: This sheet is a summary. It may not cover all possible information. If you have questions about this medicine, talk to your doctor, pharmacist, or health care provider.  2018 Elsevier/Gold Standard (2007-06-18 17:09:13) Ibuprofen tablets and  capsules What is this medicine? IBUPROFEN (eye BYOO proe fen) is a non-steroidal anti-inflammatory drug (NSAID). It is used for dental pain, fever, headaches or migraines, osteoarthritis, rheumatoid arthritis, or painful monthly periods. It can also relieve minor aches and pains caused by a cold, flu, or sore throat. This medicine may be used for other purposes; ask your health care provider or pharmacist if you have questions. COMMON BRAND NAME(S): Advil, Advil Junior Strength, Advil Migraine, Genpril, Ibren, IBU, Midol, Midol Cramps and Body Aches, Motrin, Motrin IB, Motrin Junior Strength, Motrin Migraine Pain, Samson-8, Toxicology Saliva Collection What should I tell my health care provider before I take this medicine? They need to know if you have any of these conditions: -asthma -cigarette smoker -drink more than 3 alcohol containing drinks a day -heart disease or circulation problems such as heart failure or leg edema (fluid  retention) -high blood pressure -kidney disease -liver disease -stomach bleeding or ulcers -an unusual or allergic reaction to ibuprofen, aspirin, other NSAIDS, other medicines, foods, dyes, or preservatives -pregnant or trying to get pregnant -breast-feeding How should I use this medicine? Take this medicine by mouth with a glass of water. Follow the directions on the prescription label. Take this medicine with food if your stomach gets upset. Try to not lie down for at least 10 minutes after you take the medicine. Take your medicine at regular intervals. Do not take your medicine more often than directed. A special MedGuide will be given to you by the pharmacist with each prescription and refill. Be sure to read this information carefully each time. Talk to your pediatrician regarding the use of this medicine in children. Special care may be needed. Overdosage: If you think you have taken too much of this medicine contact a poison control center or emergency room at once. NOTE: This medicine is only for you. Do not share this medicine with others. What if I miss a dose? If you miss a dose, take it as soon as you can. If it is almost time for your next dose, take only that dose. Do not take double or extra doses. What may interact with this medicine? Do not take this medicine with any of the following medications: -cidofovir -ketorolac -methotrexate -pemetrexed This medicine may also interact with the following medications: -alcohol -aspirin -diuretics -lithium -other drugs for inflammation like prednisone -warfarin This list may not describe all possible interactions. Give your health care provider a list of all the medicines, herbs, non-prescription drugs, or dietary supplements you use. Also tell them if you smoke, drink alcohol, or use illegal drugs. Some items may interact with your medicine. What should I watch for while using this medicine? Tell your doctor or healthcare  professional if your symptoms do not start to get better or if they get worse. This medicine does not prevent heart attack or stroke. In fact, this medicine may increase the chance of a heart attack or stroke. The chance may increase with longer use of this medicine and in people who have heart disease. If you take aspirin to prevent heart attack or stroke, talk with your doctor or health care professional. Do not take other medicines that contain aspirin, ibuprofen, or naproxen with this medicine. Side effects such as stomach upset, nausea, or ulcers may be more likely to occur. Many medicines available without a prescription should not be taken with this medicine. This medicine can cause ulcers and bleeding in the stomach and intestines at any time during treatment. Ulcers and bleeding  can happen without warning symptoms and can cause death. To reduce your risk, do not smoke cigarettes or drink alcohol while you are taking this medicine. You may get drowsy or dizzy. Do not drive, use machinery, or do anything that needs mental alertness until you know how this medicine affects you. Do not stand or sit up quickly, especially if you are an older patient. This reduces the risk of dizzy or fainting spells. This medicine can cause you to bleed more easily. Try to avoid damage to your teeth and gums when you brush or floss your teeth. This medicine may be used to treat migraines. If you take migraine medicines for 10 or more days a month, your migraines may get worse. Keep a diary of headache days and medicine use. Contact your healthcare professional if your migraine attacks occur more frequently. What side effects may I notice from receiving this medicine? Side effects that you should report to your doctor or health care professional as soon as possible: -allergic reactions like skin rash, itching or hives, swelling of the face, lips, or tongue -severe stomach pain -signs and symptoms of bleeding such as  bloody or black, tarry stools; red or dark-brown urine; spitting up blood or brown material that looks like coffee grounds; red spots on the skin; unusual bruising or bleeding from the eye, gums, or nose -signs and symptoms of a blood clot such as changes in vision; chest pain; severe, sudden headache; trouble speaking; sudden numbness or weakness of the face, arm, or leg -unexplained weight gain or swelling -unusually weak or tired -yellowing of eyes or skin Side effects that usually do not require medical attention (report to your doctor or health care professional if they continue or are bothersome): -bruising -diarrhea -dizziness, drowsiness -headache -nausea, vomiting This list may not describe all possible side effects. Call your doctor for medical advice about side effects. You may report side effects to FDA at 1-800-FDA-1088. Where should I keep my medicine? Keep out of the reach of children. Store at room temperature between 15 and 30 degrees C (59 and 86 degrees F). Keep container tightly closed. Throw away any unused medicine after the expiration date. NOTE: This sheet is a summary. It may not cover all possible information. If you have questions about this medicine, talk to your doctor, pharmacist, or health care provider.  2018 Elsevier/Gold Standard (2012-11-13 10:48:02)

## 2017-12-12 ENCOUNTER — Ambulatory Visit: Payer: Medicaid Other | Admitting: Nephrology

## 2017-12-12 VITALS — BP 147/86 | HR 74 | Temp 97.9°F | Ht 61.0 in | Wt 192.6 lb

## 2017-12-12 DIAGNOSIS — E119 Type 2 diabetes mellitus without complications: Secondary | ICD-10-CM

## 2017-12-12 LAB — GLUCOSE, POCT (MANUAL RESULT ENTRY): POC Glucose: 167 mg/dl — AB (ref 70–99)

## 2017-12-12 NOTE — Patient Instructions (Addendum)
Take 2 pills of metformin in the morning, 1 pill of metformin at night for a few days. If you feel no side effects (diarrhea, stomachache), take 2 pills of metformin in the morning and take 2 pills of metformin at night. If you continue to have side effects, then speak to your primary care physician about extended release metformin.  Continue to cut back on the soda! Drink more water.

## 2017-12-12 NOTE — Progress Notes (Signed)
Follow up Diabetes/ Endocrine [facility]     Patient ID: Sarah [name], female   DOB: [birth year], 62 y.o.   MRN: [id number] Assessment:  Sarah [name] is a 62 y.o. female who is seen in follow up for diabetes at the request of Tawni Millers, MD.   Plan:    1.Patient tolerating medications well. Never have had higher doses of metformin than 500mg  BID. Making lifestyle changes with cutting out soda and other changes to diet, including more vegetable intake and cutting out rice. Motivated to lose weight and also wants a hip replacement (appt with Ortho in Oct 2019).  - UMI[facility] check due 02/2018 - Optho for DR screening on 12/21/2017 - f/u scheduled 01/2018 - Monofilament exam due 11/2018  Patient Instructions  Take 2 pills of metformin in the morning, 1 pill of metformin at night for a few days. If you feel no side effects (diarrhea, stomachache), take 2 pills of metformin in the morning and take 2 pills of metformin at night. If you continue to have side effects, then speak to your primary care physician about extended release metformin.  Continue to cut back on the soda! Drink more water.  No orders of the defined types were placed in this encounter.    Subjective:  Sometimes at night might forget to take 500mg  metformin. Denies any upset stomaches, abdominal pain, diarrhea. Tries to take food with medicine. Denies any muscle pains. Denies shaking, chills, lightheaded, or feelings of passing out. Check blood sugars at home, but struggles to get blood. Blood sugars have been running high ~170s. Will check before eating in the morning. Will check blood sugars once a day. Trying to cut sugar out of diet, especially trying to cut back sodas by trying to drink 1 soda a day (non-diet soda). Trying to buy flavor packets to water. Diet: cutting back on potatoes and rice; trying to eat a lot of vegetables, lots of fruit, fried food. Problems with L hip and therefore, does not do much  walking though she tries with her walker occasionally. Previous job in [employer] (closed last Dec) requires picking up and moving around. Currently not working, had a part-time job from which she was let go. Appt in Oct 2019 for the hip (with orthopedics, hoping to get a hip replacement). Does not do illicit drugs. Smoke 1 cigarette every other day or every day. Trying to cut back on smoking, had quit before. Does not drink any alcohol. Fingers feel numb sometimes. 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 Informed of having arthritis in both of knees/legs. Feeling a little numb in the L middle finger and L palm. Endorses numbness at the tips of the fingers on R hand. Reports that this numbness has been going for a while. Denies it getting worse, seems to ease up when not doing activities with hands. Denies fatigue, thirst, weight loss, chest pain, SOB, tingling sensation in feet. Gets up 3-4 times during the night to go to the bathroom. Lives with a friend at home, though he is not there most of the time. Sister and brother lives nearby, will visit sister sometimes. Wash feet with alcohol occasionally, check feet everyday. Cellulitis on the L middle finger has been leaking, patient reports it improving with swelling slightly improving, has a few more days until the end of the antibiotic regimen.    Review of [place]  has no past medical history on file.  Family History, Social History, current Medications and allergies reviewed and updated in Epic.  Objective:    There were no vitals taken for this visit. Physical Exam  Constitutional: She is oriented to person, place, and time. She appears well-developed and well-nourished.  HENT:  Head: Normocephalic and atraumatic.  Eyes: Conjunctivae are normal. Right eye exhibits no discharge. Left eye exhibits no discharge. No scleral icterus.  Cardiovascular: Normal rate, regular rhythm  and normal heart sounds. Exam reveals no gallop and no friction rub.  No murmur heard. Pulmonary/Chest: Effort normal and breath sounds normal. No stridor. No respiratory distress. She has no wheezes. She has no rales.  Musculoskeletal: She exhibits edema (+1 pitting edema bilaterally up to the level of the knee).  Feet:  Right Foot:  Protective Sensation: 10 sites tested. 10 sites sensed.  Left Foot:  Protective Sensation: 10 sites tested. 10 sites sensed.  Neurological: She is alert and oriented to person, place, and time.  Skin: Skin is warm.  Psychiatric: She has a normal mood and affect. Her behavior is normal.        Data : I have personally reviewed pertinent labs and imaging studies, if indicated,  with the patient in clinic today.   Lab Orders  No laboratory test(s) ordered today    HC Readings from Last 3 Encounters:  No data found for [facility]    Wt Readings from Last 3 Encounters:  12/05/17 190 lb 6.4 oz (86.4 kg)  10/25/17 189 lb 3.2 oz (85.8 kg)  08/16/17 191 lb (86.6 kg)

## 2017-12-14 ENCOUNTER — Ambulatory Visit: Payer: Medicaid Other | Admitting: Adult Health Nurse Practitioner

## 2017-12-14 ENCOUNTER — Encounter: Payer: Self-pay | Admitting: Emergency Medicine

## 2017-12-14 ENCOUNTER — Inpatient Hospital Stay
Admission: EM | Admit: 2017-12-14 | Discharge: 2017-12-19 | DRG: 988 | Disposition: A | Discharge status: Home Health | Payer: Medicaid Other | Attending: Internal Medicine | Admitting: Internal Medicine

## 2017-12-14 ENCOUNTER — Emergency Department: Payer: Medicaid Other

## 2017-12-14 ENCOUNTER — Other Ambulatory Visit: Payer: Self-pay

## 2017-12-14 DIAGNOSIS — E872 Acidosis: Secondary | ICD-10-CM | POA: Diagnosis present

## 2017-12-14 DIAGNOSIS — E785 Hyperlipidemia, unspecified: Secondary | ICD-10-CM | POA: Diagnosis present

## 2017-12-14 DIAGNOSIS — M869 Osteomyelitis, unspecified: Secondary | ICD-10-CM | POA: Diagnosis present

## 2017-12-14 DIAGNOSIS — Z833 Family history of diabetes mellitus: Secondary | ICD-10-CM

## 2017-12-14 DIAGNOSIS — M84445A Pathological fracture, left finger(s), initial encounter for fracture: Secondary | ICD-10-CM | POA: Diagnosis present

## 2017-12-14 DIAGNOSIS — E1169 Type 2 diabetes mellitus with other specified complication: Principal | ICD-10-CM | POA: Diagnosis present

## 2017-12-14 DIAGNOSIS — E1165 Type 2 diabetes mellitus with hyperglycemia: Secondary | ICD-10-CM | POA: Diagnosis present

## 2017-12-14 DIAGNOSIS — E0590 Thyrotoxicosis, unspecified without thyrotoxic crisis or storm: Secondary | ICD-10-CM | POA: Diagnosis present

## 2017-12-14 DIAGNOSIS — Z791 Long term (current) use of non-steroidal anti-inflammatories (NSAID): Secondary | ICD-10-CM | POA: Diagnosis not present

## 2017-12-14 DIAGNOSIS — M009 Pyogenic arthritis, unspecified: Secondary | ICD-10-CM | POA: Diagnosis present

## 2017-12-14 DIAGNOSIS — Z23 Encounter for immunization: Secondary | ICD-10-CM

## 2017-12-14 DIAGNOSIS — Z7984 Long term (current) use of oral hypoglycemic drugs: Secondary | ICD-10-CM

## 2017-12-14 DIAGNOSIS — L0390 Cellulitis, unspecified: Secondary | ICD-10-CM

## 2017-12-14 DIAGNOSIS — Z90710 Acquired absence of both cervix and uterus: Secondary | ICD-10-CM | POA: Diagnosis not present

## 2017-12-14 DIAGNOSIS — L03012 Cellulitis of left finger: Secondary | ICD-10-CM | POA: Diagnosis present

## 2017-12-14 DIAGNOSIS — F17210 Nicotine dependence, cigarettes, uncomplicated: Secondary | ICD-10-CM | POA: Diagnosis present

## 2017-12-14 DIAGNOSIS — S61209D Unspecified open wound of unspecified finger without damage to nail, subsequent encounter: Secondary | ICD-10-CM | POA: Insufficient documentation

## 2017-12-14 DIAGNOSIS — Z79899 Other long term (current) drug therapy: Secondary | ICD-10-CM

## 2017-12-14 DIAGNOSIS — I10 Essential (primary) hypertension: Secondary | ICD-10-CM | POA: Diagnosis present

## 2017-12-14 DIAGNOSIS — Z888 Allergy status to other drugs, medicaments and biological substances status: Secondary | ICD-10-CM | POA: Diagnosis not present

## 2017-12-14 HISTORY — DX: Type 2 diabetes mellitus without complications: E11.9

## 2017-12-14 LAB — COMPREHENSIVE METABOLIC PANEL
ALBUMIN: 4.2 g/dL (ref 3.5–5.0)
ALK PHOS: 76 U/L (ref 38–126)
ALT: 15 U/L (ref 0–44)
AST: 21 U/L (ref 15–41)
Anion gap: 9 (ref 5–15)
BILIRUBIN TOTAL: 0.4 mg/dL (ref 0.3–1.2)
BUN: 16 mg/dL (ref 6–20)
CALCIUM: 9.6 mg/dL (ref 8.9–10.3)
CO2: 25 mmol/L (ref 22–32)
Chloride: 106 mmol/L (ref 98–111)
Creatinine, Ser: 0.86 mg/dL (ref 0.44–1.00)
GFR calc Af Amer: >60 mL/min (ref >60)
GFR calc non Af Amer: >60 mL/min (ref >60)
GLUCOSE: 173 mg/dL — AB (ref 70–99)
POTASSIUM: 3.6 mmol/L (ref 3.5–5.1)
Sodium: 140 mmol/L (ref 135–145)
TOTAL PROTEIN: 8 g/dL (ref 6.5–8.1)

## 2017-12-14 LAB — CBC WITH DIFFERENTIAL/PLATELET
BASOS ABS: 0.1 10*3/uL (ref 0–0.1)
BASOS PCT: 1 %
Eosinophils Absolute: 0.2 10*3/uL (ref 0–0.7)
Eosinophils Relative: 2 %
HEMATOCRIT: 37.8 % (ref 35.0–47.0)
HEMOGLOBIN: 12.6 g/dL (ref 12.0–16.0)
Lymphocytes Relative: 32 %
Lymphs Abs: 3.9 10*3/uL — ABNORMAL HIGH (ref 1.0–3.6)
MCH: 29.3 pg (ref 26.0–34.0)
MCHC: 33.5 g/dL (ref 32.0–36.0)
MCV: 87.6 fL (ref 80.0–100.0)
Monocytes Absolute: 0.6 10*3/uL (ref 0.2–0.9)
Monocytes Relative: 5 %
NEUTROS ABS: 7.4 10*3/uL — AB (ref 1.4–6.5)
NEUTROS PCT: 60 %
Platelets: 222 10*3/uL (ref 150–440)
RBC: 4.31 MIL/uL (ref 3.80–5.20)
RDW: 13.2 % (ref 11.5–14.5)
WBC: 12.1 10*3/uL — AB (ref 3.6–11.0)

## 2017-12-14 LAB — LACTIC ACID, PLASMA: Lactic Acid, Venous: 2 mmol/L (ref 0.5–1.9)

## 2017-12-14 LAB — GLUCOSE, CAPILLARY: GLUCOSE-CAPILLARY: 103 mg/dL — AB (ref 70–99)

## 2017-12-14 MED ORDER — ATORVASTATIN CALCIUM 10 MG PO TABS
10.0000 mg | ORAL_TABLET | Freq: Every day | ORAL | Status: DC
  Administered 2017-12-16 – 2017-12-18 (×4): 10 mg via ORAL
  Filled 2017-12-14 (×4): qty 1

## 2017-12-14 MED ORDER — INFLUENZA VAC SPLIT QUAD 0.5 ML IM SUSY
0.5000 mL | PREFILLED_SYRINGE | INTRAMUSCULAR | Status: AC
  Administered 2017-12-16: 0.5 mL via INTRAMUSCULAR
  Filled 2017-12-14: qty 0.5

## 2017-12-14 MED ORDER — ONDANSETRON HCL 4 MG/2ML IJ SOLN
4.0000 mg | Freq: Four times a day (QID) | INTRAMUSCULAR | Status: DC | PRN
  Administered 2017-12-15: 4 mg via INTRAVENOUS

## 2017-12-14 MED ORDER — ONDANSETRON HCL 4 MG PO TABS: 4.0000 mg | ORAL_TABLET | Freq: Four times a day (QID) | ORAL | Status: DC | PRN

## 2017-12-14 MED ORDER — POLYETHYLENE GLYCOL 3350 17 G PO PACK: 17.0000 g | PACK | Freq: Every day | ORAL | Status: DC | PRN

## 2017-12-14 MED ORDER — ACETAMINOPHEN 325 MG PO TABS: 650.0000 mg | ORAL_TABLET | Freq: Four times a day (QID) | ORAL | Status: DC | PRN

## 2017-12-14 MED ORDER — METHIMAZOLE 10 MG PO TABS
10.0000 mg | ORAL_TABLET | Freq: Every day | ORAL | Status: DC
  Administered 2017-12-15 – 2017-12-19 (×5): 10 mg via ORAL
  Filled 2017-12-14 (×5): qty 1

## 2017-12-14 MED ORDER — MELOXICAM 7.5 MG PO TABS
15.0000 mg | ORAL_TABLET | Freq: Every day | ORAL | Status: DC
  Administered 2017-12-15 – 2017-12-19 (×5): 15 mg via ORAL
  Filled 2017-12-14 (×5): qty 2

## 2017-12-14 MED ORDER — HYDRALAZINE HCL 20 MG/ML IJ SOLN: 5.0000 mg | INTRAMUSCULAR | Status: DC | PRN

## 2017-12-14 MED ORDER — INSULIN ASPART 100 UNIT/ML ~~LOC~~ SOLN
0.0000 [IU] | Freq: Every day | SUBCUTANEOUS | Status: DC
  Administered 2017-12-17 – 2017-12-18 (×2): 2 [IU] via SUBCUTANEOUS
  Filled 2017-12-14 (×2): qty 1

## 2017-12-14 MED ORDER — INSULIN ASPART 100 UNIT/ML ~~LOC~~ SOLN
0.0000 [IU] | Freq: Three times a day (TID) | SUBCUTANEOUS | Status: DC
  Administered 2017-12-15: 1 [IU] via SUBCUTANEOUS
  Administered 2017-12-15: 3 [IU] via SUBCUTANEOUS
  Administered 2017-12-16: 5 [IU] via SUBCUTANEOUS
  Administered 2017-12-16: 3 [IU] via SUBCUTANEOUS
  Administered 2017-12-17: 1 [IU] via SUBCUTANEOUS
  Administered 2017-12-17 (×2): 2 [IU] via SUBCUTANEOUS
  Administered 2017-12-18: 3 [IU] via SUBCUTANEOUS
  Administered 2017-12-18: 2 [IU] via SUBCUTANEOUS
  Filled 2017-12-14 (×10): qty 1

## 2017-12-14 MED ORDER — SODIUM CHLORIDE 0.9 % IV BOLUS
1000.0000 mL | Freq: Once | INTRAVENOUS | Status: AC
  Administered 2017-12-14: 1000 mL via INTRAVENOUS

## 2017-12-14 MED ORDER — ENOXAPARIN SODIUM 40 MG/0.4ML ~~LOC~~ SOLN: 40.0000 mg | SUBCUTANEOUS | Status: DC

## 2017-12-14 MED ORDER — ACETAMINOPHEN 650 MG RE SUPP: 650.0000 mg | Freq: Four times a day (QID) | RECTAL | Status: DC | PRN

## 2017-12-14 MED ORDER — METOPROLOL TARTRATE 25 MG PO TABS
25.0000 mg | ORAL_TABLET | Freq: Two times a day (BID) | ORAL | Status: DC
  Administered 2017-12-14 – 2017-12-19 (×8): 25 mg via ORAL
  Filled 2017-12-14 (×10): qty 1

## 2017-12-14 NOTE — ED Provider Notes (Addendum)
[facility] Emergency Department Provider Note  ___________________________________________   First MD Initiated Contact with Patient 12/14/17 2027     (approximate)  I have reviewed the triage vital signs and the nursing notes.   HISTORY  Chief Complaint Cellulitis   HPI Sarah [name] is a 62 y.o. female with a history of diabetes who is presenting with a left index finger infection.  She states that she has been on Keflex now for approximately 1 week and the infection seems to be worsening.  She says that it is been draining pus from the "cuticle."  Patient sent to the emergency department from outpatient clinic for further evaluation and treatment.  Says that she is able to range her finger.  Denies numbness.   Past Medical History:  Diagnosis Date  . Diabetes mellitus without complication [facility])     Patient Active Problem List   Diagnosis Date Noted  . Finger wound, simple, open, subsequent encounter 12/14/2017  . Primary osteoarthritis of left hip 02/28/2017  . Primary osteoarthritis of left knee 02/28/2017  . Hyperlipidemia associated with type 2 diabetes mellitus ([place]) 03/05/2015  . Biceps tendinitis 08/10/2014  . Essential (primary) hypertension 08/10/2014  . Hyperthyroidism 08/10/2014  . Uncontrolled type 2 diabetes mellitus without complication, without long-term current use of insulin ([place]) 08/10/2014    Past Surgical History:  Procedure Laterality Date  . ABDOMINAL HYSTERECTOMY    . PARTIAL HYSTERECTOMY      Prior to Admission medications   Medication Sig Start Date End Date Taking? Authorizing Provider  atorvastatin (LIPITOR) 10 MG tablet Take 1 tablet (10 mg total) by mouth daily. 10/25/17   Tawni Millers, MD  cephALEXin (KEFLEX) 500 MG capsule Take 1 capsule (500 mg total) by mouth 3 (three) times daily for 10 days. 12/05/17 12/15/17  Azzie Glatter, FNP  glimepiride (AMARYL) 2 MG tablet TAKE ONE TABLET BY MOUTH ONCE  DAILY BEFORE  BREAKFAST 10/25/17   Tawni Millers, MD  ibuprofen (ADVIL,MOTRIN) 800 MG tablet Take 1 tablet (800 mg total) by mouth every 8 (eight) hours as needed. 12/05/17   Azzie Glatter, FNP  meloxicam (MOBIC) 15 MG tablet Take 1 tablet (15 mg total) by mouth daily. 10/25/17   Tawni Millers, MD  metFORMIN (GLUCOPHAGE) 500 MG tablet Take 500 mg by mouth 2 (two) times daily with a meal.    [provider]  methimazole (TAPAZOLE) 10 MG tablet Take 1 tablet (10 mg total) by mouth daily. 10/25/17   Tawni Millers, MD  metoprolol tartrate (LOPRESSOR) 25 MG tablet Take 1 tablet (25 mg total) by mouth 2 (two) times daily. 10/25/17   Tawni Millers, MD    Allergies Ace inhibitors and Wilder Glade [dapagliflozin]  Family History  Problem Relation Age of Onset  . Diabetes Mother   . Diabetes Sister   . Breast cancer Neg Hx     Social History Social History   Tobacco Use  . Smoking status: Current Some Day Smoker    Types: Cigarettes  . Smokeless tobacco: Never Used  Substance Use Topics  . Alcohol use: No    Alcohol/week: 0.0 standard drinks  . Drug use: No    Review of Systems  Constitutional: No fever/chills Eyes: No visual changes. ENT: No sore throat. Cardiovascular: Denies chest pain. Respiratory: Denies shortness of breath. Gastrointestinal: No abdominal pain.  No nausea, no vomiting.  No diarrhea.  No constipation. Genitourinary: Negative for dysuria. Musculoskeletal: Negative for back pain. Skin:  As above Neurological: Negative for headaches, focal weakness or numbness.   ____________________________________________   PHYSICAL EXAM:  VITAL SIGNS: ED Triage Vitals [12/14/17 2024]  Enc Vitals Group     BP (!) 175/83     Pulse Rate 73     Resp 18     Temp 97.6 F (36.4 C)     Temp Source Oral     SpO2 99 %     Weight 191 lb (86.6 kg)     Height 5\' 2"  (1.575 m)     Head Circumference      Peak Flow      Pain Score 7     Pain Loc      Pain Edu?       Excl. in [place]?     Constitutional: Alert and oriented. Well appearing and in no acute distress. Eyes: Conjunctivae are normal.  Head: Atraumatic. Nose: No congestion/rhinnorhea. Mouth/Throat: Mucous membranes are moist.  Neck: No stridor.   Cardiovascular: Normal rate, regular rhythm. Grossly normal heart sounds.   Respiratory: Normal respiratory effort.  No retractions. Lungs CTAB. Gastrointestinal: Soft and nontender. No distention. Musculoskeletal: No lower extremity tenderness nor edema.  No joint effusions.  Left middle finger with circumferential swelling to the segments of fingers of the middle and distal phalanx.  There is no fluctuance.  No tenderness to palpation along the flexor tendon.  Patient able to range the finger fully and is sensate to light touch.  Open skin of the cuticle without pus being expressed at this time.  Neurologic:  Normal speech and language. No gross focal neurologic deficits are appreciated. Skin:  Skin is warm, dry and intact. No rash noted. Psychiatric: Mood and affect are normal. Speech and behavior are normal.  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 ____________________________________________   PROCEDURES  Procedure(s) performed:   Procedures  Critical Care performed:   ____________________________________________   INITIAL IMPRESSION / ASSESSMENT AND PLAN / ED COURSE  Pertinent labs & imaging results that were available during my care of the patient were reviewed by me and considered in my medical  decision making (see chart for details).  DDX: Osteomyelitis, cellulitis, sepsis, failure of outpatient treatment As part of my medical decision making, I reviewed the following data within the electronic MEDICAL RECORD NUMBER Notes from prior ED visits  ----------------------------------------- 9:14 PM on 12/14/2017 -----------------------------------------  Patient with failure of outpatient antibiotics.  History of diabetes.  Will admit to the hospitalist.  Signed out to Dr. Jannifer Franklin.  Patient aware of need for admission and IV antibiotics.  She is understanding of the diagnosis as well as treatment and willing to comply.  I do not believe the patient has flexor tenosynovitis. ____________________________________________   FINAL CLINICAL IMPRESSION(S) / ED DIAGNOSES  Left middle finger cellulitis with osteomyelitis of the distal phalanx.  NEW MEDICATIONS STARTED DURING THIS VISIT:  New Prescriptions   No medications on file     Note:  This document was prepared using Dragon voice recognition software and may include unintentional dictation errors.     Orbie Pyo, MD 12/14/17 2115    Orbie Pyo, MD 12/14/17 [id number]

## 2017-12-14 NOTE — H&P (Addendum)
[facility] [place] NAME: Sarah [name]    MR#:  [id number]  DATE OF BIRTH:  [birth year]   DATE OF ADMISSION:  12/14/2017  PRIMARY CARE PHYSICIAN: Tawni Millers, MD   REQUESTING/REFERRING PHYSICIAN: Larae Grooms, MD  CHIEF COMPLAINT:   Chief Complaint  Patient presents with  . Cellulitis    HISTORY OF PRESENT ILLNESS:  Sarah [name]  is a 62 y.o. female with a known history of T2DM who presented to the ED with left middle finger pain for the last week. One week ago, she pulled a hang nail off her finger with her teeth. She had a small amount of bleeding at that time. She has had progressive erythema, pain, and swelling of her finger since then. She was seen in clinic and was started on Keflex, which hasn't helped at all. She was seen again in clinic and was sent to the ED for further management. No fevers, no chills, no nausea.  In the ED, WBC 12 and lactic acid 2.0. X-ray left middle finger showed severe third distal phalanx osteomyelitis with suspected pathologic fracture and septic arthropathy. She was started on vancomycin and cefepime. Hospitalists were called for admission.  PAST MEDICAL HISTORY:   Past Medical History:  Diagnosis Date  . Diabetes mellitus without complication ([place])     PAST SURGICAL HISTORY:   Past Surgical History:  Procedure Laterality Date  . ABDOMINAL HYSTERECTOMY    . PARTIAL HYSTERECTOMY      SOCIAL HISTORY:   Social History   Tobacco Use  . Smoking status: Current Some Day Smoker    Types: Cigarettes  . Smokeless tobacco: Never Used  Substance Use Topics  . Alcohol use: No    Alcohol/week: 0.0 standard drinks    FAMILY HISTORY:   Family History  Problem Relation Age of Onset  . Diabetes Mother   . Diabetes Sister   . Breast cancer Neg Hx     DRUG ALLERGIES:   Allergies  Allergen Reactions  . Ace Inhibitors Nausea Only  . Wilder Glade [Dapagliflozin]     Vaginitis      REVIEW OF SYSTEMS:   Review of Systems  Constitutional: Negative for chills and fever.  HENT: Negative for congestion and sore throat.   Eyes: Negative for blurred vision and double vision.  Respiratory: Negative for cough and shortness of breath.   Cardiovascular: Negative for chest pain, palpitations and leg swelling.  Gastrointestinal: Negative for abdominal pain, nausea and vomiting.  Genitourinary: Negative for dysuria and frequency.  Musculoskeletal: Positive for joint pain. Negative for back pain and neck pain.  Neurological: Negative for dizziness and headaches.  Psychiatric/Behavioral: Negative for depression. The patient does not have insomnia.     MEDICATIONS AT HOME:   Prior to Admission medications   Medication Sig Start Date End Date Taking? Authorizing Provider  atorvastatin (LIPITOR) 10 MG tablet Take 1 tablet (10 mg total) by mouth daily. 10/25/17   Tawni Millers, MD  cephALEXin (KEFLEX) 500 MG capsule Take 1 capsule (500 mg total) by mouth 3 (three) times daily for 10 days. 12/05/17 12/15/17  Azzie Glatter, FNP  glimepiride (AMARYL) 2 MG tablet TAKE ONE TABLET BY MOUTH ONCE DAILY BEFORE  BREAKFAST 10/25/17   Tawni Millers, MD  ibuprofen (ADVIL,MOTRIN) 800 MG tablet Take 1 tablet (800 mg total) by mouth every 8 (eight) hours as needed. 12/05/17   Azzie Glatter, FNP  meloxicam (MOBIC) 15 MG tablet Take  1 tablet (15 mg total) by mouth daily. 10/25/17   Tawni Millers, MD  metFORMIN (GLUCOPHAGE) 500 MG tablet Take 500 mg by mouth 2 (two) times daily with a meal.    [provider]  methimazole (TAPAZOLE) 10 MG tablet Take 1 tablet (10 mg total) by mouth daily. 10/25/17   Tawni Millers, MD  metoprolol tartrate (LOPRESSOR) 25 MG tablet Take 1 tablet (25 mg total) by mouth 2 (two) times daily. 10/25/17   Tawni Millers, MD      VITAL SIGNS:  Blood pressure (!) 175/83, pulse 73, temperature 97.6 F (36.4 C), temperature source Oral, resp. rate 18, height 5'  2" (1.575 m), weight 86.6 kg, SpO2 99 %.  PHYSICAL EXAMINATION:  Physical Exam  GENERAL:  62 y.o.-year-old patient lying in the bed with no acute distress.  EYES: Pupils equal, round, reactive to light and accommodation. No scleral icterus. Extraocular muscles intact.  HEENT: Head atraumatic, normocephalic. Oropharynx and nasopharynx clear.  NECK:  Supple, no jugular venous distention. No thyroid enlargement, no tenderness.  LUNGS: Normal breath sounds bilaterally, no wheezing, rales,rhonchi or crepitation. No use of accessory muscles of respiration.  CARDIOVASCULAR: RRR, S1, S2 normal. No murmurs, rubs, or gallops.  ABDOMEN: Soft, nontender, nondistended. Bowel sounds present. No organomegaly or mass.  EXTREMITIES: No pedal edema, cyanosis, or clubbing. Left middle finger is edematous and erythematous. NEUROLOGIC: Cranial nerves II through XII are intact. Muscle strength 5/5 in all extremities. Sensation intact. Gait not checked.  PSYCHIATRIC: The patient is alert and oriented x 3.  SKIN: No obvious rash, lesion, or ulcer.   LABORATORY PANEL:   CBC Recent Labs  Lab 12/14/17 2046  WBC 12.1*  HGB 12.6  HCT 37.8  PLT 222   ------------------------------------------------------------------------------------------------------------------  Chemistries  No results for input(s): NA, K, CL, CO2, GLUCOSE, BUN, CREATININE, CALCIUM, MG, AST, ALT, ALKPHOS, BILITOT in the last 168 hours.  Invalid input(s): GFRCGP ------------------------------------------------------------------------------------------------------------------  Cardiac Enzymes No results for input(s): TROPONINI in the last 168 hours. ------------------------------------------------------------------------------------------------------------------  RADIOLOGY:  No results found.    IMPRESSION AND PLAN:   Left middle finger osteomyelitis/pathologic fracture/septic arthropathy- x-ray with severe third distal phalanx  osteomyelitis with suspected pathologic fracture and septic arthropathy. Failed outpatient treatment with keflex. No signs of sepsis.  - continue vancomycin and cefepime - orthopedic surgery consult - f/u blood cultures  Lactic acidosis- likely related to infection. - will give a 1L NS bolus - trend lactic acid  Hypertension- BPs elevated in the ED and have been elevated in clinic recently. - continue metoprolol - start lisinopril  - hydralazine IV prn  Type 2 diabetes- hyperglycemic in the ED. On metformin and amaryl at home. - sensitive SSI  Hyperthyroidism- stable - continue home metoprolol and methimazole  Hyperlipidemia- stable - continue home lipitor  All the records are reviewed and case discussed with ED provider. Management plans discussed with the patient, family and they are in agreement.  CODE STATUS: full  TOTAL TIME TAKING CARE OF THIS PATIENT: 45 minutes.    Berna Spare Catheline Hixon M.D on 12/14/2017 at 9:32 PM  Between 7am to 6pm - Pager - [phone]  After 6pm go to [email or web address] - Proofreader  [facility]  Office  [phone]  CC: Primary care physician; Tawni Millers, MD   Note: This dictation was prepared with Dragon dictation along with smaller phrase technology. Any transcriptional errors that result from this process are unintentional.

## 2017-12-14 NOTE — ED Triage Notes (Addendum)
Patient ambulatory to triage with steady gait, without difficulty or distress noted; pt reports [facility]; st she cut her left 3rd finger cuticle with nail clippers several wks ago; now with swelling, excoriation & discoloration noted; currently taking antibiotic since last Thurs; pt diabetic; pt is picking at the sloughing skin on her finger; informed pt NOT to do such

## 2017-12-14 NOTE — Progress Notes (Signed)
  Patient: Sarah [name] Female    DOB: 1955/06/15   62 y.o.   MRN: [id number] Visit Date: 12/14/2017  Today's Provider: Staci Acosta, NP   Chief Complaint  Patient presents with  . Wound Infection    left middle finger   Subjective:    HPI   Left middle finger with continued wound- started on Keflex last week on day 7/10 of treatment. She states it is not much improved desptie antibiotic use. States that the area hurts when touched. She has been to [facility] clinic for this in the past.   Allergies  Allergen Reactions  . Ace Inhibitors Nausea Only  . Farxiga [Dapagliflozin]     Vaginitis    Previous Medications   ATORVASTATIN (LIPITOR) 10 MG TABLET    Take 1 tablet (10 mg total) by mouth daily.   CEPHALEXIN (KEFLEX) 500 MG CAPSULE    Take 1 capsule (500 mg total) by mouth 3 (three) times daily for 10 days.   GLIMEPIRIDE (AMARYL) 2 MG TABLET    TAKE ONE TABLET BY MOUTH ONCE DAILY BEFORE  BREAKFAST   IBUPROFEN (ADVIL,MOTRIN) 800 MG TABLET    Take 1 tablet (800 mg total) by mouth every 8 (eight) hours as needed.   MELOXICAM (MOBIC) 15 MG TABLET    Take 1 tablet (15 mg total) by mouth daily.   METFORMIN (GLUCOPHAGE) 500 MG TABLET    Take 500 mg by mouth 2 (two) times daily with a meal.   METHIMAZOLE (TAPAZOLE) 10 MG TABLET    Take 1 tablet (10 mg total) by mouth daily.   METOPROLOL TARTRATE (LOPRESSOR) 25 MG TABLET    Take 1 tablet (25 mg total) by mouth 2 (two) times daily.    Review of Systems  All other systems reviewed and are negative.   Social History   Tobacco Use  . Smoking status: Current Some Day Smoker    Types: Cigarettes  . Smokeless tobacco: Never Used  Substance Use Topics  . Alcohol use: No    Alcohol/week: 0.0 standard drinks   Objective:   BP (!) 178/97 (BP Location: Left Arm, Patient Position: Sitting)   Pulse 74   Temp 98.4 F (36.9 C) (Oral)   Ht 5' 1.25" (1.556 m)   Wt 192 lb 4.8 oz (87.2 kg)   BMI 36.04 kg/m   Physical Exam    Skin:           Assessment & Plan:        Sent to ED for further evaluation.     Staci Acosta, NP   [facility]

## 2017-12-15 ENCOUNTER — Encounter
Admission: EM | Disposition: A | Discharge status: Home Health | Payer: Self-pay | Source: Home / Self Care | Attending: Internal Medicine

## 2017-12-15 ENCOUNTER — Telehealth: Payer: Self-pay

## 2017-12-15 ENCOUNTER — Inpatient Hospital Stay: Payer: Medicaid Other | Admitting: Anesthesiology

## 2017-12-15 DIAGNOSIS — Z888 Allergy status to other drugs, medicaments and biological substances status: Secondary | ICD-10-CM

## 2017-12-15 DIAGNOSIS — Z720 Tobacco use: Secondary | ICD-10-CM

## 2017-12-15 DIAGNOSIS — M84445S Pathological fracture, left finger(s), sequela: Secondary | ICD-10-CM

## 2017-12-15 DIAGNOSIS — Z79899 Other long term (current) drug therapy: Secondary | ICD-10-CM

## 2017-12-15 DIAGNOSIS — H0520 Unspecified exophthalmos: Secondary | ICD-10-CM

## 2017-12-15 DIAGNOSIS — E0590 Thyrotoxicosis, unspecified without thyrotoxic crisis or storm: Secondary | ICD-10-CM

## 2017-12-15 DIAGNOSIS — M869 Osteomyelitis, unspecified: Secondary | ICD-10-CM

## 2017-12-15 DIAGNOSIS — I10 Essential (primary) hypertension: Secondary | ICD-10-CM

## 2017-12-15 DIAGNOSIS — L089 Local infection of the skin and subcutaneous tissue, unspecified: Secondary | ICD-10-CM

## 2017-12-15 DIAGNOSIS — E1169 Type 2 diabetes mellitus with other specified complication: Secondary | ICD-10-CM

## 2017-12-15 HISTORY — PX: AMPUTATION: SHX166

## 2017-12-15 LAB — CBC
HEMATOCRIT: 32.8 % — AB (ref 35.0–47.0)
Hemoglobin: 11 g/dL — ABNORMAL LOW (ref 12.0–16.0)
MCH: 29.8 pg (ref 26.0–34.0)
MCHC: 33.6 g/dL (ref 32.0–36.0)
MCV: 88.7 fL (ref 80.0–100.0)
PLATELETS: 165 10*3/uL (ref 150–440)
RBC: 3.7 MIL/uL — ABNORMAL LOW (ref 3.80–5.20)
RDW: 13.2 % (ref 11.5–14.5)
WBC: 10.4 10*3/uL (ref 3.6–11.0)

## 2017-12-15 LAB — BASIC METABOLIC PANEL
Anion gap: 9 (ref 5–15)
BUN: 15 mg/dL (ref 6–20)
CHLORIDE: 108 mmol/L (ref 98–111)
CO2: 24 mmol/L (ref 22–32)
CREATININE: 0.72 mg/dL (ref 0.44–1.00)
Calcium: 8.7 mg/dL — ABNORMAL LOW (ref 8.9–10.3)
GFR calc Af Amer: >60 mL/min (ref >60)
GFR calc non Af Amer: >60 mL/min (ref >60)
GLUCOSE: 172 mg/dL — AB (ref 70–99)
POTASSIUM: 3.4 mmol/L — AB (ref 3.5–5.1)
SODIUM: 141 mmol/L (ref 135–145)

## 2017-12-15 LAB — GLUCOSE, CAPILLARY
GLUCOSE-CAPILLARY: 83 mg/dL (ref 70–99)
Glucose-Capillary: 142 mg/dL — ABNORMAL HIGH (ref 70–99)
Glucose-Capillary: 223 mg/dL — ABNORMAL HIGH (ref 70–99)
Glucose-Capillary: 198 mg/dL — ABNORMAL HIGH (ref 70–99)

## 2017-12-15 LAB — LACTIC ACID, PLASMA: Lactic Acid, Venous: 1.9 mmol/L (ref 0.5–1.9)

## 2017-12-15 LAB — MRSA PCR SCREENING: MRSA by PCR: NEGATIVE

## 2017-12-15 SURGERY — AMPUTATION DIGIT
Anesthesia: General | Laterality: Left

## 2017-12-15 SURGERY — Surgical Case
Anesthesia: *Unknown

## 2017-12-15 MED ORDER — DEXTROSE IN LACTATED RINGERS 5 % IV SOLN
INTRAVENOUS | Status: DC
  Administered 2017-12-15: 21:00:00 via INTRAVENOUS

## 2017-12-15 MED ORDER — ONDANSETRON HCL 4 MG/2ML IJ SOLN: 4.0000 mg | Freq: Once | INTRAMUSCULAR | Status: DC | PRN

## 2017-12-15 MED ORDER — ONDANSETRON HCL 4 MG/2ML IJ SOLN
INTRAMUSCULAR | Status: AC
  Filled 2017-12-15: qty 2

## 2017-12-15 MED ORDER — LIDOCAINE HCL (PF) 2 % IJ SOLN
INTRAMUSCULAR | Status: AC
  Filled 2017-12-15: qty 10

## 2017-12-15 MED ORDER — EPHEDRINE SULFATE 50 MG/ML IJ SOLN
INTRAMUSCULAR | Status: AC
  Filled 2017-12-15: qty 1

## 2017-12-15 MED ORDER — ACETAMINOPHEN 10 MG/ML IV SOLN
INTRAVENOUS | Status: DC | PRN
  Administered 2017-12-15: 1000 mg via INTRAVENOUS

## 2017-12-15 MED ORDER — FENTANYL CITRATE (PF) 100 MCG/2ML IJ SOLN
INTRAMUSCULAR | Status: DC | PRN
  Administered 2017-12-15: 50 ug via INTRAVENOUS

## 2017-12-15 MED ORDER — FENTANYL CITRATE (PF) 100 MCG/2ML IJ SOLN: 25.0000 ug | INTRAMUSCULAR | Status: DC | PRN

## 2017-12-15 MED ORDER — BUPIVACAINE HCL (PF) 0.5 % IJ SOLN
INTRAMUSCULAR | Status: DC | PRN
  Administered 2017-12-15: 5 mL

## 2017-12-15 MED ORDER — DEXAMETHASONE SODIUM PHOSPHATE 10 MG/ML IJ SOLN
INTRAMUSCULAR | Status: AC
  Filled 2017-12-15: qty 1

## 2017-12-15 MED ORDER — DEXAMETHASONE SODIUM PHOSPHATE 10 MG/ML IJ SOLN
INTRAMUSCULAR | Status: DC | PRN
  Administered 2017-12-15: 5 mg via INTRAVENOUS

## 2017-12-15 MED ORDER — HYDRALAZINE HCL 20 MG/ML IJ SOLN: 10.0000 mg | Freq: Four times a day (QID) | INTRAMUSCULAR | Status: DC | PRN

## 2017-12-15 MED ORDER — FENTANYL CITRATE (PF) 100 MCG/2ML IJ SOLN
INTRAMUSCULAR | Status: AC
  Filled 2017-12-15: qty 2

## 2017-12-15 MED ORDER — ACETAMINOPHEN 10 MG/ML IV SOLN
INTRAVENOUS | Status: AC
  Filled 2017-12-15: qty 100

## 2017-12-15 MED ORDER — SODIUM CHLORIDE 0.9 % IV SOLN
2.0000 g | Freq: Two times a day (BID) | INTRAVENOUS | Status: DC
  Administered 2017-12-15 – 2017-12-18 (×5): 2 g via INTRAVENOUS
  Filled 2017-12-15 (×9): qty 2

## 2017-12-15 MED ORDER — EPHEDRINE SULFATE 50 MG/ML IJ SOLN
INTRAMUSCULAR | Status: DC | PRN
  Administered 2017-12-15 (×3): 5 mg via INTRAVENOUS

## 2017-12-15 MED ORDER — MIDAZOLAM HCL 2 MG/2ML IJ SOLN
INTRAMUSCULAR | Status: AC
  Filled 2017-12-15: qty 2

## 2017-12-15 MED ORDER — BUPIVACAINE HCL (PF) 0.5 % IJ SOLN
INTRAMUSCULAR | Status: AC
  Filled 2017-12-15: qty 30

## 2017-12-15 MED ORDER — VANCOMYCIN HCL IN DEXTROSE 750-5 MG/150ML-% IV SOLN
750.0000 mg | Freq: Once | INTRAVENOUS | Status: AC
  Administered 2017-12-15: 750 mg via INTRAVENOUS
  Filled 2017-12-15: qty 150

## 2017-12-15 MED ORDER — SODIUM CHLORIDE 0.9 % IV SOLN
INTRAVENOUS | Status: DC | PRN
  Administered 2017-12-16 – 2017-12-17 (×2): 250 mL via INTRAVENOUS

## 2017-12-15 MED ORDER — NEOMYCIN-POLYMYXIN B GU 40-200000 IR SOLN
Status: DC | PRN
  Administered 2017-12-15: 2 mL

## 2017-12-15 MED ORDER — HYDROCHLOROTHIAZIDE 25 MG PO TABS
25.0000 mg | ORAL_TABLET | Freq: Every day | ORAL | Status: DC
  Administered 2017-12-16 – 2017-12-19 (×4): 25 mg via ORAL
  Filled 2017-12-15 (×4): qty 1

## 2017-12-15 MED ORDER — METRONIDAZOLE IN NACL 5-0.79 MG/ML-% IV SOLN
500.0000 mg | Freq: Three times a day (TID) | INTRAVENOUS | Status: DC
  Administered 2017-12-15 – 2017-12-18 (×7): 500 mg via INTRAVENOUS
  Filled 2017-12-15 (×11): qty 100

## 2017-12-15 MED ORDER — LIDOCAINE HCL (CARDIAC) PF 100 MG/5ML IV SOSY
PREFILLED_SYRINGE | INTRAVENOUS | Status: DC | PRN
  Administered 2017-12-15: 100 mg via INTRAVENOUS

## 2017-12-15 MED ORDER — NEOMYCIN-POLYMYXIN B GU 40-200000 IR SOLN
Status: AC
  Filled 2017-12-15: qty 2

## 2017-12-15 MED ORDER — VANCOMYCIN HCL IN DEXTROSE 750-5 MG/150ML-% IV SOLN
750.0000 mg | Freq: Two times a day (BID) | INTRAVENOUS | Status: DC
  Administered 2017-12-15 – 2017-12-17 (×4): 750 mg via INTRAVENOUS
  Filled 2017-12-15 (×6): qty 150

## 2017-12-15 MED ORDER — MIDAZOLAM HCL 2 MG/2ML IJ SOLN
INTRAMUSCULAR | Status: DC | PRN
  Administered 2017-12-15: 2 mg via INTRAVENOUS

## 2017-12-15 MED ORDER — PHENYLEPHRINE HCL 10 MG/ML IJ SOLN
INTRAMUSCULAR | Status: DC | PRN
  Administered 2017-12-15 (×2): 100 ug via INTRAVENOUS

## 2017-12-15 MED ORDER — PROPOFOL 10 MG/ML IV BOLUS
INTRAVENOUS | Status: DC | PRN
  Administered 2017-12-15: 150 mg via INTRAVENOUS

## 2017-12-15 SURGICAL SUPPLY — 32 items
BANDAGE ELASTIC 3 LF NS (GAUZE/BANDAGES/DRESSINGS) IMPLANT
BNDG COHESIVE 4X5 TAN STRL (GAUZE/BANDAGES/DRESSINGS) IMPLANT
BNDG ESMARK 4X12 TAN STRL LF (GAUZE/BANDAGES/DRESSINGS) ×3 IMPLANT
BNDG GAUZE 1X2.1 STRL (MISCELLANEOUS) ×6 IMPLANT
CANISTER SUCT 1200ML W/VALVE (MISCELLANEOUS) ×3 IMPLANT
CNTNR SPEC 2.5X3XGRAD LEK (MISCELLANEOUS) ×2
CONT SPEC 4OZ STER OR WHT (MISCELLANEOUS) ×4
CONTAINER SPEC 2.5X3XGRAD LEK (MISCELLANEOUS) ×2 IMPLANT
CUFF TOURN 18 STER (MISCELLANEOUS) ×3 IMPLANT
DRAPE FLUOR MINI C-ARM 54X84 (DRAPES) ×3 IMPLANT
DRSG DERMACEA 8X12 NADH (GAUZE/BANDAGES/DRESSINGS) IMPLANT
DURAPREP 26ML APPLICATOR (WOUND CARE) IMPLANT
GAUZE PETRO XEROFOAM 1X8 (MISCELLANEOUS) ×3 IMPLANT
GAUZE PETROLATUM 1 X8 (GAUZE/BANDAGES/DRESSINGS) ×3 IMPLANT
GAUZE SPONGE 4X4 12PLY STRL (GAUZE/BANDAGES/DRESSINGS) IMPLANT
GLOVE BIOGEL M STRL SZ7.5 (GLOVE) ×12 IMPLANT
GOWN STRL REUS W/ TWL LRG LVL3 (GOWN DISPOSABLE) ×2 IMPLANT
GOWN STRL REUS W/TWL LRG LVL3 (GOWN DISPOSABLE) ×4
KIT TURNOVER KIT A (KITS) ×3 IMPLANT
NS IRRIG 500ML POUR BTL (IV SOLUTION) ×3 IMPLANT
PACK EXTREMITY ARMC (MISCELLANEOUS) ×3 IMPLANT
PAD PREP 24X41 OB/GYN DISP (PERSONAL CARE ITEMS) ×3 IMPLANT
SOL PREP PVP 2OZ (MISCELLANEOUS) ×3
SOLUTION PREP PVP 2OZ (MISCELLANEOUS) ×1 IMPLANT
SPONGE GAUZE 2X2 8PLY STER LF (GAUZE/BANDAGES/DRESSINGS)
SPONGE GAUZE 2X2 8PLY STRL LF (GAUZE/BANDAGES/DRESSINGS) IMPLANT
STOCKINETTE BIAS CUT 3 980034 (MISCELLANEOUS) IMPLANT
STOCKINETTE STRL 4IN 9604848 (GAUZE/BANDAGES/DRESSINGS) ×3 IMPLANT
STRETCH NET 2 107126 (MISCELLANEOUS) ×3 IMPLANT
SUT ETHILON 5-0 FS-2 18 BLK (SUTURE) ×3 IMPLANT
SUT MNCRL AB 0 CT1 27 (SUTURE) ×3 IMPLANT
TRAY PREP VAG/GEN (MISCELLANEOUS) ×3 IMPLANT

## 2017-12-15 NOTE — Progress Notes (Signed)
Pharmacy Antibiotic Note  Sarah [name] is a 62 y.o. female admitted on 12/14/2017 with cellulitis.  Pharmacy has been consulted for vancomycin and cefepime dosing.  Plan: DW 64kg  Vd 45L kei 0.063 hr-1  T1/2 11 hours Vancomycin 750 mg q 12 hours ordered with stacked dosing. Level before 5th dose. Goal trough 15-20.  Cefepime 2 grams q 12 hours ordered  Height: 5\' 2"  (157.5 cm) Weight: 188 lb 9.6 oz (85.5 kg) IBW/kg (Calculated) : 50.1  Temp (24hrs), Avg:97.9 F (36.6 C), Min:97.6 F (36.4 C), Max:98.4 F (36.9 C)  Recent Labs  Lab 12/14/17 2046 12/14/17 2309  WBC 12.1*  --   CREATININE 0.86  --   LATICACIDVEN 2.0* 1.9    Estimated Creatinine Clearance: 70.6 mL/min (by C-G formula based on SCr of 0.86 mg/dL).    Allergies  Allergen Reactions  . Ace Inhibitors Nausea Only  . Wilder Glade [Dapagliflozin]     Vaginitis     Antimicrobials this admission: Vancomycin, cefepime 9/20  >>    >>   Dose adjustments this admission:   Microbiology results: 9/19 BCx: pending  Thank you for allowing pharmacy to be a part of this patient's care.  Honi Name S 12/15/2017 12:51 AM

## 2017-12-15 NOTE — Anesthesia Preprocedure Evaluation (Signed)
Anesthesia Evaluation  Patient identified by MRN, date of birth, ID band Patient awake    Reviewed: Allergy & Precautions, NPO status , Patient's Chart, lab work & pertinent test results  History of Anesthesia Complications Negative for: history of anesthetic complications  Airway Mallampati: III       Dental  (+) Missing, Chipped   Pulmonary neg sleep apnea, neg COPD, Current Smoker,           Cardiovascular hypertension, Pt. on medications (-) Past MI and (-) CHF (-) dysrhythmias (-) Valvular Problems/Murmurs     Neuro/Psych neg Seizures    GI/Hepatic Neg liver ROS, neg GERD  ,  Endo/Other  diabetes, Type 2, Oral Hypoglycemic AgentsHyperthyroidism   Renal/GU negative Renal ROS     Musculoskeletal   Abdominal   Peds  Hematology   Anesthesia Other Findings   Reproductive/Obstetrics                            Anesthesia Physical Anesthesia Plan  ASA: III  Anesthesia Plan: General   Post-op Pain Management:    Induction: Intravenous  PONV Risk Score and Plan: 2 and Dexamethasone and Ondansetron  Airway Management Planned: LMA  Additional Equipment:   Intra-op Plan:   Post-operative Plan:   Informed Consent: I have reviewed the patients History and Physical, chart, labs and discussed the procedure including the risks, benefits and alternatives for the proposed anesthesia with the patient or authorized representative who has indicated his/her understanding and acceptance.     Plan Discussed with:   Anesthesia Plan Comments:         Anesthesia Quick Evaluation

## 2017-12-15 NOTE — Telephone Encounter (Signed)
Patient called to cancel appt for Monday because she currently has NO INSURANCE. She said she has been seeing the walk in clinic in [place] until she gets insurance for herself.   Canceled upcoming appt and she stated she will call the office and schedule appt when insurance is active.

## 2017-12-15 NOTE — Progress Notes (Signed)
Pt education given about nail files and clippers allowed r/t Hx of DM2 per MD conversation. Pt with current infection.

## 2017-12-15 NOTE — Progress Notes (Signed)
[place] [facility] [place] NAME: Sarah [name]    MR#:  [id number]  DATE OF BIRTH:  [birth year]  SUBJECTIVE:   Patient here with osteomyelitis of third finger.  She says she was cutting her nail and that is how her infection started  REVIEW OF SYSTEMS:    Review of Systems  Constitutional: Negative for fever, chills weight loss HENT: Negative for ear pain, nosebleeds, congestion, facial swelling, rhinorrhea, neck pain, neck stiffness and ear discharge.   Respiratory: Negative for cough, shortness of breath, wheezing  Cardiovascular: Negative for chest pain, palpitations and leg swelling.  Gastrointestinal: Negative for heartburn, abdominal pain, vomiting, diarrhea or consitpation Genitourinary: Negative for dysuria, urgency, frequency, hematuria Musculoskeletal: Negative for back pain or joint pain Third digit pain Neurological: Negative for dizziness, seizures, syncope, focal weakness,  numbness and headaches.  Hematological: Does not bruise/bleed easily.  Psychiatric/Behavioral: Negative for hallucinations, confusion, dysphoric mood    Tolerating Diet: yes      DRUG ALLERGIES:   Allergies  Allergen Reactions  . Ace Inhibitors Nausea Only  . Farxiga [Dapagliflozin]     Vaginitis     VITALS:  Blood pressure (!) 164/90, pulse 65, temperature 97.6 F (36.4 C), temperature source Oral, resp. rate 19, height 5\' 2"  (1.575 m), weight 85.5 kg, SpO2 100 %.  PHYSICAL EXAMINATION:  Constitutional: Appears well-developed and well-nourished. No distress. HENT: Normocephalic. Marland Kitchen Oropharynx is clear and moist.  Eyes: Conjunctivae and EOM are normal. PERRLA, no scleral icterus.  Neck: Normal ROM. Neck supple. No JVD. No tracheal deviation. CVS: RRR, S1/S2 +, no murmurs, no gallops, no carotid bruit.  Pulmonary: Effort and breath sounds normal, no stridor, rhonchi, wheezes, rales.  Abdominal: Soft. BS +,  no distension, tenderness, rebound or  guarding.  Musculoskeletal: Normal range of motion. No edema and no tenderness.  Neuro: Alert. CN 2-12 grossly intact. No focal deficits. Skin: Third digit is wrapped  psychiatric: Normal mood and affect.      LABORATORY PANEL:   CBC Recent Labs  Lab 12/15/17 0516  WBC 10.4  HGB 11.0*  HCT 32.8*  PLT 165   ------------------------------------------------------------------------------------------------------------------  Chemistries  Recent Labs  Lab 12/14/17 2046 12/15/17 0516  NA 140 141  K 3.6 3.4*  CL 106 108  CO2 25 24  GLUCOSE 173* 172*  BUN 16 15  CREATININE 0.86 0.72  CALCIUM 9.6 8.7*  AST 21  --   ALT 15  --   ALKPHOS 76  --   BILITOT 0.4  --    ------------------------------------------------------------------------------------------------------------------  Cardiac Enzymes No results for input(s): TROPONINI in the last 168 hours. ------------------------------------------------------------------------------------------------------------------  RADIOLOGY:  Dg Finger Middle Left  Result Date: 12/14/2017 CLINICAL DATA:  Cut finger with nail clippers several weeks ago, now with swelling and discoloration. On antibiotics. EXAM: LEFT MIDDLE FINGER 2+V COMPARISON:  None. FINDINGS: Destructive lytic lesion third distal phalanx with suspected nondisplaced pathologic fracture extending to the DIP. Bony reabsorption of the tuft. Third finger soft tissue swelling with distal subcutaneous gas, no radiopaque foreign bodies. IMPRESSION: 1. Severe third distal phalanx osteomyelitis with suspected pathologic fracture and septic arthropathy. 2. Soft tissue swelling in distal finger subcutaneous gas. 3. Acute findings discussed with and reconfirmed by Dr.DAVID SCHAEVITZ on 12/14/2017 at 9:34 pm. Electronically Signed   By: Elon Alas M.D.   On: 12/14/2017 21:34     ASSESSMENT AND PLAN:   62 year old female with a history of diabetes who presents with left middle  finger  pain and redness.  1.  Left middle finger osteomyelitis seen on x-ray: Plan to go to operating room this afternoon Case discussed with Dr. Marry Guan Continue vancomycin and cefepime ID consultation placed  2.  Essential hypertension: Continue metoprolol and add HCTZ for better blood pressure control PRN hydralazine  3.  Diabetes: Continue sliding scale and ADA diet  4.  Hyperthyroidism: Continue Tapazole      Management plans discussed with the patient and she is in agreement.  CODE STATUS: Full  TOTAL TIME TAKING CARE OF THIS PATIENT: 29 minutes.     POSSIBLE D/C tomorrow, DEPENDING ON CLINICAL CONDITION.   Mayci Haning M.D on 12/15/2017 at 10:01 AM  Between 7am to 6pm - Pager - [phone] After 6pm go to [email or web address] - password [email or web address] [facility]  Office  [phone]  CC: Primary care physician; Tawni Millers, MD  Note: This dictation was prepared with Dragon dictation along with smaller phrase technology. Any transcriptional errors that result from this process are unintentional.

## 2017-12-15 NOTE — Progress Notes (Signed)
Per conversation with Ortho MD, pt may have clears until 12 noon today then NPO. Will see pt later today to discuss plan.

## 2017-12-15 NOTE — Consult Note (Signed)
ORTHOPAEDIC CONSULTATION  PATIENT NAME: Sarah [name] DOB: 12-09-1955  MRN: [id number]  REQUESTING PHYSICIAN: Bettey Costa, MD  Chief Complaint: Infected left long finger  HPI: Sarah [name] is a 62 y.o. right-hand-dominant female who complains of pain and purulent drainage from the distal aspect of the left long finger.  She apparently clipped her left long finger nail with a nail cutter and then pulled a hangnail with her teeth approximately 2 to 3 weeks ago.  She had some progressive erythema and pain.  She was apparently placed on Keflex by her primary care physician without any significant improvement.  She has actually had foul-smelling purulent discharge from the distal aspect of the finger over the course of the last week.  Past Medical History:  Diagnosis Date  . Diabetes mellitus without complication [facility])    Past Surgical History:  Procedure Laterality Date  . ABDOMINAL HYSTERECTOMY    . PARTIAL HYSTERECTOMY     Social History   Socioeconomic History  . Marital status: Single    Spouse name: Not on file  . Number of children: Not on file  . Years of education: Not on file  . Highest education level: 12th grade  Occupational History  . Occupation: unemployed  Social Needs  . Financial resource strain: Not hard at all  . Food insecurity:    Worry: Never true    Inability: Never true  . Transportation needs:    Medical: No    Non-medical: No  Tobacco Use  . Smoking status: Current Some Day Smoker    Types: Cigarettes  . Smokeless tobacco: Never Used  Substance and Sexual Activity  . Alcohol use: No    Alcohol/week: 0.0 standard drinks  . Drug use: No  . Sexual activity: Not on file  Lifestyle  . Physical activity:    Days per week: 4 days    Minutes per session: 10 min  . Stress: Not at all  Relationships  . Social connections:    Talks on phone: More than three times a week    Gets together: More than three times a week    Attends  religious service: More than 4 times per year    Active member of club or organization: No    Attends meetings of clubs or organizations: Never    Relationship status: Not on file  Other Topics Concern  . Not on file  Social History Narrative   - pt not interested in participating    No consent form signed      Pt is on food stamps, her boyfriend is too who helps her out with finances   Family History  Problem Relation Age of Onset  . Diabetes Mother   . Diabetes Sister   . Breast cancer Neg Hx    Allergies  Allergen Reactions  . Ace Inhibitors Nausea Only  . Farxiga [Dapagliflozin]     Vaginitis    Prior to Admission medications   Medication Sig Start Date End Date Taking? Authorizing Provider  atorvastatin (LIPITOR) 10 MG tablet Take 1 tablet (10 mg total) by mouth daily. 10/25/17  Yes Tawni Millers, MD  cephALEXin (KEFLEX) 500 MG capsule Take 1 capsule (500 mg total) by mouth 3 (three) times daily for 10 days. 12/05/17 12/15/17 Yes Azzie Glatter, FNP  glimepiride (AMARYL) 2 MG tablet TAKE ONE TABLET BY MOUTH ONCE DAILY BEFORE  BREAKFAST 10/25/17  Yes Tawni Millers, MD  ibuprofen (ADVIL,MOTRIN) 800 MG tablet Take 1 tablet (  800 mg total) by mouth every 8 (eight) hours as needed. 12/05/17  Yes Azzie Glatter, FNP  meloxicam (MOBIC) 15 MG tablet Take 1 tablet (15 mg total) by mouth daily. 10/25/17  Yes Tawni Millers, MD  metFORMIN (GLUCOPHAGE) 500 MG tablet Take 500 mg by mouth 2 (two) times daily with a meal.   Yes [provider]  methimazole (TAPAZOLE) 10 MG tablet Take 1 tablet (10 mg total) by mouth daily. 10/25/17  Yes Tawni Millers, MD  metoprolol tartrate (LOPRESSOR) 25 MG tablet Take 1 tablet (25 mg total) by mouth 2 (two) times daily. 10/25/17  Yes Tawni Millers, MD   Dg Finger Middle Left  Result Date: 12/14/2017 CLINICAL DATA:  Cut finger with nail clippers several weeks ago, now with swelling and discoloration. On antibiotics. EXAM: LEFT MIDDLE FINGER  2+V COMPARISON:  None. FINDINGS: Destructive lytic lesion third distal phalanx with suspected nondisplaced pathologic fracture extending to the DIP. Bony reabsorption of the tuft. Third finger soft tissue swelling with distal subcutaneous gas, no radiopaque foreign bodies. IMPRESSION: 1. Severe third distal phalanx osteomyelitis with suspected pathologic fracture and septic arthropathy. 2. Soft tissue swelling in distal finger subcutaneous gas. 3. Acute findings discussed with and reconfirmed by Dr.DAVID SCHAEVITZ on 12/14/2017 at 9:34 pm. Electronically Signed   By: Elon Alas M.D.   On: 12/14/2017 21:34    Positive ROS: All other systems have been reviewed and were otherwise negative with the exception of those mentioned in the HPI and as above.  Physical Exam: General: Well developed, well nourished female seen in no acute distress. HEENT: Atraumatic and normocephalic. Sclera are clear. Extraocular motion is intact. Oropharynx is clear with moist mucosa. Neck: Supple, nontender, good range of motion. No JVD or carotid bruits. Lungs: Clear to auscultation bilaterally. Cardiovascular: Regular rate and rhythm with normal S1 and S2. No murmurs. No gallops or rubs. Pedal pulses are palpable bilaterally. Homans test is negative bilaterally. No significant pretibial or ankle edema. Abdomen: Soft, nontender, and nondistended. Bowel sounds are present. Skin: No lesions in the area of chief complaint Neurologic: Awake, alert, and oriented. Sensory function is grossly intact. Motor strength is felt to be 5 over 5 bilaterally. No clonus or tremor. Good motor coordination. Lymphatic: No axillary or cervical lymphadenopathy  MUSCULOSKELETAL: Examination of the left long finger demonstrates moderate degree of swelling.  There is some drainage from around the nail.  Atrophic changes are noted to the distal aspect of the digit.  Mild erythema is noted to the area.  There is fluctuance to the distal  phalanx.  Assessment: Infection of the left long finger with radiographic evidence of osteomyelitis (distal phalanx)  Plan: The findings were discussed in detail with the patient.  I also reviewed my discussion with Dr.Ravishankar.  I have recommended amputation of the distal phalanx of the left long finger with associated irrigation and debridement.  Tissue and bone will be submitted for cultures.  The usual perioperative course was discussed. The risks and benefits of surgical intervention were reviewed. The patient expressed understanding of the risks and benefits and agreed with plans for surgical intervention.   The surgical site was signed as per the "right site surgery" protocol.   Joandry Slagter P. Holley Bouche M.D.

## 2017-12-15 NOTE — Anesthesia Procedure Notes (Signed)
Procedure Name: LMA Insertion Date/Time: 12/15/2017 8:51 PM Performed by: Zetta Bills, CRNA Pre-anesthesia Checklist: Patient identified, Emergency Drugs available, Suction available and Patient being monitored Patient Re-evaluated:Patient Re-evaluated prior to induction Oxygen Delivery Method: Circle system utilized Preoxygenation: Pre-oxygenation with 100% oxygen Induction Type: IV induction Ventilation: Mask ventilation without difficulty LMA: LMA inserted LMA Size: 4.0 Number of attempts: 1 Placement Confirmation: positive ETCO2 Tube secured with: Tape Dental Injury: Teeth and Oropharynx as per pre-operative assessment

## 2017-12-15 NOTE — Consult Note (Signed)
Date of Admission:  12/14/2017                 Reason for Consult: Osteomyelitis of the middle finger   Referring Provider: Mody    HPI: Sarah [name] is a 62 y.o. female with a history of diabetes mellitus hyperthyroidismPresented to the emergency department with swelling of the left middle finger, foul-smelling discharge for the past week.  Patient states she clipped the nail with a nail cutter and then pulled a hangnail with her teeth.  She had some bleeding initially.  This was 2 to 3 weeks ago.  Then as it was getting progressively worse with erythema pain and swelling of her finger she went to her primary care physician and was started on Keflex which has not helped her much.  She was sent to the emergency department as it was not getting better.  She has no fever or chills.   In the ED her temperature was 97.9 blood pressure was 147/86 WBC was 12.1, creatinine 0.86 she had an x-ray of the middle finger and that showed osteomyelitis of the third distal phalanx along with pathological fracture.  She was started on IV vancomycin and cefepime.  I been asked to see the patient for management of her osteomyelitis.  Patient states she sustained an injury to the middle finger 20 years ago when  it was caught in a machine.  There was fracture and she also underwent surgery.  Past Medical History:  Diagnosis Date  . Diabetes mellitus without complication ([place])   Hyperthyroidism Hypertension  Past Surgical History:  Procedure Laterality Date  . ABDOMINAL HYSTERECTOMY    . PARTIAL HYSTERECTOMY      Social History   Tobacco Use  . Smoking status: Current Some Day Smoker    Types: Cigarettes  . Smokeless tobacco: Never Used  Substance Use Topics  . Alcohol use: No    Alcohol/week: 0.0 standard drinks  . Drug use: No    Family History  Problem Relation Age of Onset  . Diabetes Mother   . Diabetes Sister   . Breast cancer Neg Hx      . atorvastatin  10 mg Oral Daily  .  enoxaparin (LOVENOX) injection  40 mg Subcutaneous Q24H  . hydrochlorothiazide  25 mg Oral Daily  . Influenza vac split quadrivalent PF  0.5 mL Intramuscular Tomorrow-1000  . insulin aspart  0-5 Units Subcutaneous QHS  . insulin aspart  0-9 Units Subcutaneous TID WC  . meloxicam  15 mg Oral Daily  . methimazole  10 mg Oral Daily  . metoprolol tartrate  25 mg Oral BID      Abtx:  Anti-infectives (From admission, onward)   Start     Dose/Rate Route Frequency Ordered Stop   12/15/17 0800  vancomycin (VANCOCIN) IVPB 750 mg/150 ml premix     750 mg 150 mL/hr over 60 Minutes Intravenous Every 12 hours 12/15/17 0051     12/15/17 0200  ceFEPIme (MAXIPIME) 2 g in sodium chloride 0.9 % 100 mL IVPB     2 g 200 mL/hr over 30 Minutes Intravenous Every 12 hours 12/15/17 0032     12/15/17 0045  vancomycin (VANCOCIN) IVPB 750 mg/150 ml premix     750 mg 150 mL/hr over 60 Minutes Intravenous  Once 12/15/17 0032 12/15/17 0140       Review of Systems: Review of Systems  Constitutional: Negative for chills, fever, malaise/fatigue and weight loss.  HENT: Negative for hearing loss and  sore throat.   Eyes: Negative for blurred vision.  Respiratory: Positive for cough. Negative for sputum production and shortness of breath.   Cardiovascular: Negative for chest pain, palpitations and orthopnea.  Gastrointestinal: Negative for abdominal pain, heartburn, nausea and vomiting.  Genitourinary: Negative for dysuria and frequency.  Musculoskeletal: Negative for back pain and myalgias.  Skin: Negative for itching and rash.  Neurological: Negative for dizziness and headaches.  Psychiatric/Behavioral: Negative for depression. The patient is not nervous/anxious.       Allergies  Allergen Reactions  . Ace Inhibitors Nausea Only  . Farxiga [Dapagliflozin]     Vaginitis     OBJECTIVE: Blood pressure (!) 164/90, pulse 65, temperature 97.6 F (36.4 C), temperature source Oral, resp. rate 19, height 5'  2" (1.575 m), weight 85.5 kg, SpO2 100 %.  Physical Exam  Constitutional: She is oriented to person, place, and time. She appears well-developed.  HENT:  Head: Normocephalic and atraumatic.  Eyes: Pupils are equal, round, and reactive to light.  exopthalmos  Neck: Thyromegaly present.  Rt lobe enlarged  Cardiovascular: Normal rate and regular rhythm.  Pulmonary/Chest: Effort normal and breath sounds normal. She has no wheezes. She has no rales.  Abdominal: Soft. She exhibits no distension. There is no tenderness.  Musculoskeletal: Normal range of motion.  Neurological: She is alert and oriented to person, place, and time.  Skin: Skin is warm.  Psychiatric: She has a normal mood and affect.  left middle finger- swollen at the distal margin The nail is bent There is some fluctuance and discoloration of the pulp. There is an granulating wound on the margin.       Lab Results CBC    Component Value Date/Time   WBC 10.4 12/15/2017 0516   RBC 3.70 (L) 12/15/2017 0516   HGB 11.0 (L) 12/15/2017 0516   HGB 11.9 10/25/2017 1117   HCT 32.8 (L) 12/15/2017 0516   HCT 35.6 10/25/2017 1117   PLT 165 12/15/2017 0516   PLT 151 10/25/2017 1117   MCV 88.7 12/15/2017 0516   MCV 85 10/25/2017 1117   MCV 80 04/12/2013 1206   MCH 29.8 12/15/2017 0516   MCHC 33.6 12/15/2017 0516   RDW 13.2 12/15/2017 0516   RDW 13.3 10/25/2017 1117   RDW 13.5 04/12/2013 1206   LYMPHSABS 3.9 (H) 12/14/2017 2046   LYMPHSABS 3.9 (H) 03/04/2015 1054   LYMPHSABS 2.4 04/12/2013 1206   MONOABS 0.6 12/14/2017 2046   MONOABS 0.6 04/12/2013 1206   EOSABS 0.2 12/14/2017 2046   EOSABS 0.2 03/04/2015 1054   EOSABS 0.1 04/12/2013 1206   BASOSABS 0.1 12/14/2017 2046   BASOSABS 0.1 03/04/2015 1054   BASOSABS 0.0 04/12/2013 1206    CMP Latest Ref Rng & Units 12/15/2017 12/14/2017 10/25/2017  Glucose 70 - 99 mg/dL 172(H) 173(H) 220(H)  BUN 6 - 20 mg/dL 15 16 11   Creatinine 0.44 - 1.00 mg/dL 0.72 0.86 0.86  Sodium  135 - 145 mmol/L 141 140 144  Potassium 3.5 - 5.1 mmol/L 3.4(L) 3.6 3.5  Chloride 98 - 111 mmol/L 108 106 106  CO2 22 - 32 mmol/L 24 25 22   Calcium 8.9 - 10.3 mg/dL 8.7(L) 9.6 9.4  Total Protein 6.5 - 8.1 g/dL - 8.0 7.3  Total Bilirubin 0.3 - 1.2 mg/dL - 0.4 0.5  Alkaline Phos 38 - 126 U/L - 76 116  AST 15 - 41 U/L - 21 11  ALT 0 - 44 U/L - 15 9      Microbiology:  Recent Results (from the past 240 hour(s))  Blood culture (single)     Status: None (Preliminary result)   Collection Time: 12/14/17  8:46 PM  Result Value Ref Range Status   Specimen Description BLOOD RIGHT ANTECUBITAL  Final   Special Requests   Final    BOTTLES DRAWN AEROBIC AND ANAEROBIC Blood Culture adequate volume   Culture   Final    NO GROWTH < 12 HOURS Performed at [facility], [address], [place], [place] [address]    Report Status PENDING  Incomplete    Radiographs and labs were personally reviewed by me.   Assessment and Plan Sarah [name] is a 62 y.o. female with a history of diabetes mellitus hyperthyroidismPresented to the emergency department with swelling of the left middle finger, foul-smelling discharge for the past week.  Patient states she clipped the nail with a nail cutter and then pulled a hangnail with her teeth.  She had some bleeding initially.  This was 2 to 3 weeks ago.  Then as it was getting progressively worse with erythema pain and swelling of her finger she went to her primary care physician and was started on Keflex which has not helped her much.  She was sent to the emergency department as it was not getting better.  She has no fever or chills.   In the ED her temperature was 97.9 blood pressure was 147/86 WBC was 12.1, creatinine 0.86 she had an x-ray of the middle finger and that showed osteomyelitis of the third distal phalanx along with pathological fracture.  She was started on IV vancomycin and cefepime.  I been asked to see the patient for management of her  osteomyelitis.  Patient states she sustained an injury to the middle finger 20 years ago when  it was caught in a machine.  There was fracture and she also underwent surgery.   Infection of the left middle finger with  osteomyelitis.  With underlying injury to the finger many years ago. She very likely is going to need amputation at the distal interphalangeal joint. Discussed with Dr. Marry Guan.  Uterus and pathology as well as for culture bacterial as well as AFB.  Also to send a proximal margin for clearance of infection. Currently she is on vancomycin and cefepime.  We will add Flagyl. Depending on the surgical culture and bone pathology will decide on duration and route and choice of antibiotic.  Diabetes mellitus management as per primary team  Hyperthyroidism on methimazole  Hypertension : On metoprolol and hydrochlorothiazide  On atorvastatin 10 mg.  Discussed the management with the patient and her sisters.    Discussed with Dr. Marry Guan  I am not on-call this weekend . Text if there is any question.   Tsosie Billing, MD  12/15/2017, 11:44 AM  Note:  This document was prepared using Dragon voice recognition software and may include unintentional dictation errors.

## 2017-12-15 NOTE — Anesthesia Postprocedure Evaluation (Signed)
Anesthesia Post Note  Patient: Sarah [name]  Procedure(s) Performed: AMPUTATION DIGIT left long finger (Left )  Patient location during evaluation: PACU Anesthesia Type: General Level of consciousness: awake and alert Pain management: pain level controlled Vital Signs Assessment: post-procedure vital signs reviewed and stable Respiratory status: spontaneous breathing and respiratory function stable Cardiovascular status: stable Anesthetic complications: no     Last Vitals:  Vitals:   12/15/17 1552 12/15/17 2218  BP: (!) 148/87 (!) 167/93  Pulse: (!) 56 75  Resp:  (!) 9  Temp: 36.5 C (!) 36 C  SpO2: 100% 98%    Last Pain:  Vitals:   12/15/17 2218  TempSrc:   PainSc: 0-No pain                 KEPHART,WILLIAM K

## 2017-12-15 NOTE — Op Note (Signed)
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   ANESTHESIA: general  ESTIMATED BLOOD LOSS: 7 mL  FLUIDS REPLACED: 500 mL of crystalloid  TOURNIQUET TIME: 26 minutes  INDICATIONS FOR SURGERY: [name] is a 62 y.o. year old right-hand-dominant female who has been seen for complaints of swelling, pain, and purulent drainage from the distal aspect of the left long finger.  Radiographic findings were consistent with osteomyelitis of the distal phalanx.. After discussion of the risks and benefits of surgical intervention, the patient expressed understanding of the risks benefits and agree with plans for amputation of the distal phalanx of the left long finger.   PROCEDURE IN DETAIL: The patient was brought into the operating room and, after adequate general anesthesia was achieved, tourniquet was placed on patient's upper left arm.  Patient's left hand and arm were cleaned and prepped with Betadine draped in usual sterile fashion.  The left upper extremity was elevated to exsanguinate the limb, the tourniquet was inflated to 250 mmHg.  Loupe magnification was used throughout the procedure.  A "fishmouth" type incision was made and carried down to the distal inner phalangeal joint.  The distal phalanx was excised.  The remnant of the bony distal phalanx was submitted for cultures.  Soft tissue was also submitted for cultures.  The wound was irrigated with copious amounts of normal saline with MI solution.  The tourniquet was deflated after total tourniquet time of 26 minutes.  Hemostasis was achieved using electrocautery.  The skin edges were then reapproximated using  interrupted sutures of #5-0 nylon.  A total of 5 mL of 0.25% Marcaine was used as a digital block.  A sterile dressing was applied.  The patient tolerated the procedure well.  She was transported to the recovery room in stable condition.  James P. Holley Bouche M.D.

## 2017-12-15 NOTE — Anesthesia Post-op Follow-up Note (Signed)
Anesthesia QCDR form completed.        

## 2017-12-15 NOTE — Transfer of Care (Signed)
Immediate Anesthesia Transfer of Care Note  Patient: Sarah [name] Vert  Procedure(s) Performed: AMPUTATION DIGIT left long finger (Left )  Patient Location: PACU  Anesthesia Type:General  Level of Consciousness: awake  Airway & Oxygen Therapy: Patient Spontanous Breathing  Post-op Assessment: Report given to RN  Post vital signs: stable  Last Vitals:  Vitals Value Taken Time  BP 167/93 12/15/2017 10:18 PM  Temp    Pulse 72 12/15/2017 10:22 PM  Resp 17 12/15/2017 10:22 PM  SpO2 97 % 12/15/2017 10:22 PM  Vitals shown include unvalidated device data.  Last Pain:  Vitals:   12/15/17 1552  TempSrc: Oral  PainSc:          Complications: No apparent anesthesia complications

## 2017-12-16 ENCOUNTER — Encounter: Payer: Self-pay | Admitting: Orthopedic Surgery

## 2017-12-16 LAB — GLUCOSE, CAPILLARY
GLUCOSE-CAPILLARY: 55 mg/dL — AB (ref 70–99)
GLUCOSE-CAPILLARY: 86 mg/dL (ref 70–99)
Glucose-Capillary: 258 mg/dL — ABNORMAL HIGH (ref 70–99)
Glucose-Capillary: 278 mg/dL — ABNORMAL HIGH (ref 70–99)
Glucose-Capillary: 237 mg/dL — ABNORMAL HIGH (ref 70–99)
Glucose-Capillary: 94 mg/dL (ref 70–99)

## 2017-12-16 LAB — BASIC METABOLIC PANEL
ANION GAP: 8 (ref 5–15)
BUN: 10 mg/dL (ref 6–20)
CALCIUM: 9 mg/dL (ref 8.9–10.3)
CO2: 24 mmol/L (ref 22–32)
Chloride: 108 mmol/L (ref 98–111)
Creatinine, Ser: 0.78 mg/dL (ref 0.44–1.00)
GFR calc Af Amer: >60 mL/min (ref >60)
Glucose, Bld: 315 mg/dL — ABNORMAL HIGH (ref 70–99)
Potassium: 4 mmol/L (ref 3.5–5.1)
Sodium: 140 mmol/L (ref 135–145)

## 2017-12-16 LAB — HIV ANTIBODY (ROUTINE TESTING W REFLEX): HIV Screen 4th Generation wRfx: NONREACTIVE

## 2017-12-16 LAB — VANCOMYCIN, TROUGH: Vancomycin Tr: 16 ug/mL (ref 15–20)

## 2017-12-16 MED ORDER — ACETAMINOPHEN 10 MG/ML IV SOLN
1000.0000 mg | Freq: Four times a day (QID) | INTRAVENOUS | Status: AC
  Administered 2017-12-16 (×3): 1000 mg via INTRAVENOUS
  Filled 2017-12-16 (×4): qty 100

## 2017-12-16 MED ORDER — METOCLOPRAMIDE HCL 5 MG/ML IJ SOLN: 5.0000 mg | Freq: Three times a day (TID) | INTRAMUSCULAR | Status: DC | PRN

## 2017-12-16 MED ORDER — MAGNESIUM HYDROXIDE 400 MG/5ML PO SUSP
30.0000 mL | Freq: Every day | ORAL | Status: DC | PRN
  Filled 2017-12-16: qty 30

## 2017-12-16 MED ORDER — ACETAMINOPHEN 325 MG PO TABS: 325.0000 mg | ORAL_TABLET | Freq: Four times a day (QID) | ORAL | Status: DC | PRN

## 2017-12-16 MED ORDER — ONDANSETRON HCL 4 MG/2ML IJ SOLN: 4.0000 mg | Freq: Four times a day (QID) | INTRAMUSCULAR | Status: DC | PRN

## 2017-12-16 MED ORDER — OXYCODONE HCL 5 MG PO TABS: 5.0000 mg | ORAL_TABLET | ORAL | Status: DC | PRN

## 2017-12-16 MED ORDER — GLIMEPIRIDE 2 MG PO TABS
2.0000 mg | ORAL_TABLET | Freq: Every day | ORAL | Status: DC
  Administered 2017-12-16 – 2017-12-19 (×4): 2 mg via ORAL
  Filled 2017-12-16 (×6): qty 1

## 2017-12-16 MED ORDER — METFORMIN HCL 500 MG PO TABS
500.0000 mg | ORAL_TABLET | Freq: Two times a day (BID) | ORAL | Status: DC
  Administered 2017-12-16 – 2017-12-19 (×5): 500 mg via ORAL
  Filled 2017-12-16 (×8): qty 1

## 2017-12-16 MED ORDER — METOCLOPRAMIDE HCL 10 MG PO TABS: 5.0000 mg | ORAL_TABLET | Freq: Three times a day (TID) | ORAL | Status: DC | PRN

## 2017-12-16 MED ORDER — ONDANSETRON HCL 4 MG PO TABS: 4.0000 mg | ORAL_TABLET | Freq: Four times a day (QID) | ORAL | Status: DC | PRN

## 2017-12-16 MED ORDER — HYDROMORPHONE HCL 1 MG/ML IJ SOLN: 0.5000 mg | INTRAMUSCULAR | Status: DC | PRN

## 2017-12-16 MED ORDER — PHENOL 1.4 % MT LIQD
1.0000 | OROMUCOSAL | Status: DC | PRN
  Filled 2017-12-16: qty 177

## 2017-12-16 MED ORDER — BISACODYL 10 MG RE SUPP: 10.0000 mg | Freq: Every day | RECTAL | Status: DC | PRN

## 2017-12-16 MED ORDER — ENOXAPARIN SODIUM 40 MG/0.4ML ~~LOC~~ SOLN
40.0000 mg | SUBCUTANEOUS | Status: DC
  Administered 2017-12-16 – 2017-12-19 (×4): 40 mg via SUBCUTANEOUS
  Filled 2017-12-16 (×5): qty 0.4

## 2017-12-16 MED ORDER — SENNOSIDES-DOCUSATE SODIUM 8.6-50 MG PO TABS
1.0000 | ORAL_TABLET | Freq: Two times a day (BID) | ORAL | Status: DC
  Administered 2017-12-16 – 2017-12-19 (×8): 1 via ORAL
  Filled 2017-12-16 (×8): qty 1

## 2017-12-16 MED ORDER — OXYCODONE HCL 5 MG PO TABS
10.0000 mg | ORAL_TABLET | ORAL | Status: DC | PRN
  Administered 2017-12-18: 10 mg via ORAL
  Filled 2017-12-16: qty 2

## 2017-12-16 MED ORDER — FLEET ENEMA 7-19 GM/118ML RE ENEM: 1.0000 | ENEMA | Freq: Once | RECTAL | Status: DC | PRN

## 2017-12-16 MED ORDER — SODIUM CHLORIDE 0.9 % IV SOLN
INTRAVENOUS | Status: DC
  Administered 2017-12-16: 01:00:00 via INTRAVENOUS

## 2017-12-16 MED ORDER — MENTHOL 3 MG MT LOZG
1.0000 | LOZENGE | OROMUCOSAL | Status: DC | PRN
  Filled 2017-12-16: qty 9

## 2017-12-16 NOTE — Progress Notes (Signed)
Patient resting in bed, dressing clean, dry, and intact on left 3rd finger. Pain controlled. Call bell in reach. No acute signs of distress. Continue to monitor.

## 2017-12-16 NOTE — Progress Notes (Signed)
[place] at [place] NAME: Sarah [name]    MR#:  [id number]  DATE OF BIRTH:  April 16, 1955  SUBJECTIVE:  CHIEF COMPLAINT:   Chief Complaint  Patient presents with  . Cellulitis   -Left hand middle finger osteomyelitis and felon, status post distal phalanx amputation yesterday.  Feeling much better this morning  REVIEW OF SYSTEMS:  Review of Systems  Constitutional: Negative for chills, fever and malaise/fatigue.  HENT: Negative for congestion, ear discharge, hearing loss and nosebleeds.   Eyes: Negative for blurred vision and double vision.  Respiratory: Negative for cough, shortness of breath and wheezing.   Cardiovascular: Negative for chest pain and palpitations.  Gastrointestinal: Negative for abdominal pain, constipation, diarrhea, nausea and vomiting.  Genitourinary: Negative for dysuria.  Musculoskeletal: Positive for joint pain and myalgias.  Neurological: Negative for dizziness, focal weakness, seizures, weakness and headaches.    DRUG ALLERGIES:   Allergies  Allergen Reactions  . Ace Inhibitors Nausea Only  . Farxiga [Dapagliflozin]     Vaginitis     VITALS:  Blood pressure (!) 149/96, pulse 75, temperature 97.6 F (36.4 C), temperature source Oral, resp. rate 20, height 5\' 2"  (1.575 m), weight 85.5 kg, SpO2 100 %.  PHYSICAL EXAMINATION:  Physical Exam  GENERAL:  62 y.o.-year-old obese patient lying in the bed with no acute distress.  EYES: Pupils equal, round, reactive to light and accommodation. No scleral icterus. Extraocular muscles intact.  HEENT: Head atraumatic, normocephalic. Oropharynx and nasopharynx clear.  NECK:  Supple, no jugular venous distention. No thyroid enlargement, no tenderness.  LUNGS: Normal breath sounds bilaterally, no wheezing, rales,rhonchi or crepitation. No use of accessory muscles of respiration.  CARDIOVASCULAR: S1, S2 normal. No murmurs, rubs, or gallops.  ABDOMEN: Soft,  nontender, nondistended. Bowel sounds present. No organomegaly or mass.  EXTREMITIES: No pedal edema, cyanosis, or clubbing.  Left hand middle finger dressing in place. NEUROLOGIC: Cranial nerves II through XII are intact. Muscle strength 5/5 in all extremities. Sensation intact. Gait not checked.  PSYCHIATRIC: The patient is alert and oriented x 3.  SKIN: No obvious rash, lesion, or ulcer.    LABORATORY PANEL:   CBC Recent Labs  Lab 12/15/17 0516  WBC 10.4  HGB 11.0*  HCT 32.8*  PLT 165   ------------------------------------------------------------------------------------------------------------------  Chemistries  Recent Labs  Lab 12/14/17 2046  12/16/17 0514  NA 140   < > 140  K 3.6   < > 4.0  CL 106   < > 108  CO2 25   < > 24  GLUCOSE 173*   < > 315*  BUN 16   < > 10  CREATININE 0.86   < > 0.78  CALCIUM 9.6   < > 9.0  AST 21  --   --   ALT 15  --   --   ALKPHOS 76  --   --   BILITOT 0.4  --   --    < > = values in this interval not displayed.   ------------------------------------------------------------------------------------------------------------------  Cardiac Enzymes No results for input(s): TROPONINI in the last 168 hours. ------------------------------------------------------------------------------------------------------------------  RADIOLOGY:  Dg Finger Middle Left  Result Date: 12/14/2017 CLINICAL DATA:  Cut finger with nail clippers several weeks ago, now with swelling and discoloration. On antibiotics. EXAM: LEFT MIDDLE FINGER 2+V COMPARISON:  None. FINDINGS: Destructive lytic lesion third distal phalanx with suspected nondisplaced pathologic fracture extending to the DIP. Bony reabsorption of the tuft. Third finger soft tissue swelling  with distal subcutaneous gas, no radiopaque foreign bodies. IMPRESSION: 1. Severe third distal phalanx osteomyelitis with suspected pathologic fracture and septic arthropathy. 2. Soft tissue swelling in distal finger  subcutaneous gas. 3. Acute findings discussed with and reconfirmed by Dr.DAVID SCHAEVITZ on 12/14/2017 at 9:34 pm. Electronically Signed   By: Elon Alas M.D.   On: 12/14/2017 21:34    EKG:  No orders found for this or any previous visit.  ASSESSMENT AND PLAN:   62 year old female with past medical history significant for diabetes, hypertension and hypothyroidism presents to hospital secondary to left middle finger tip pain, swelling and discharge.  1.  Left hand middle finger felon and osteomyelitis-started after deep clipping of her nail. -Appreciate Ortho consult and ID consult -X-ray confirming osteomyelitis of the distal phalanx with pathological fracture.  Status post surgery and amputation of distal phalanx on 12/15/2017. -Awaiting culture results.  Continue vancomycin, cefepime and oral Flagyl.  2.  Diabetes mellitus-sugars are still elevated.  Restart her Amaryl.  On metformin.  Sliding scale insulin  3.  Hypothyroidism-continue Tapazole  4.  Hypertension-on metoprolol and hydrochlorothiazide.  5.  DVT prophylaxis-Lovenox     All the records are reviewed and case discussed with Care Management/Social Workerr. Management plans discussed with the patient, family and they are in agreement.  CODE STATUS: Full code  TOTAL TIME TAKING CARE OF THIS PATIENT: 38 minutes.   POSSIBLE D/C IN 1-2 DAYS, DEPENDING ON CLINICAL CONDITION.   Gladstone Lighter M.D on 12/16/2017 at 8:52 AM  Between 7am to 6pm - Pager - [phone]  After 6pm go to [email or web address] - password [email or web address] [facility]  Office  [phone]  CC: Primary care physician; Tawni Millers, MD

## 2017-12-16 NOTE — Progress Notes (Signed)
Pharmacy Antibiotic Note  Sarah [name] is a 62 y.o. female admitted on 12/14/2017 with cellulitis.  Pharmacy has been consulted for vancomycin and cefepime dosing.  9/21 20:30 Vancomycin trough resulted at 16 mcg/ml  Plan: DW 64kg  Vd 45L kei 0.063 hr-1  T1/2 11 hours Will continue Vancomycin 750 mg q 12 hours. Goal trough 15-20.  Continue Cefepime 2 grams q 12 hours.  Height: 5\' 2"  (157.5 cm) Weight: 188 lb 9.6 oz (85.5 kg) IBW/kg (Calculated) : 50.1  Temp (24hrs), Avg:97.5 F (36.4 C), Min:96.8 F (36 C), Max:98.4 F (36.9 C)  Recent Labs  Lab 12/14/17 2046 12/14/17 2309 12/15/17 0516 12/16/17 0514 12/16/17 2024  WBC 12.1*  --  10.4  --   --   CREATININE 0.86  --  0.72 0.78  --   LATICACIDVEN 2.0* 1.9  --   --   --   VANCOTROUGH  --   --   --   --  16    Estimated Creatinine Clearance: 75.9 mL/min (by C-G formula based on SCr of 0.78 mg/dL).    Allergies  Allergen Reactions  . Ace Inhibitors Nausea Only  . Wilder Glade [Dapagliflozin]     Vaginitis     Antimicrobials this admission: Vancomycin, cefepime 9/20  >>    >>   Dose adjustments this admission:   Microbiology results: 9/19 BCx: pending  Thank you for allowing pharmacy to be a part of this patient's care.  Paulina Fusi, PharmD, BCPS 12/16/2017 9:14 PM

## 2017-12-16 NOTE — Plan of Care (Signed)

## 2017-12-16 NOTE — Progress Notes (Addendum)
   Subjective: 1 Day Post-Op Procedure(s) (LRB): AMPUTATION DIGIT left long finger (Left) Patient reports pain as 0 on 0-10 scale.   Patient is well, and has had no acute complaints or problems   Objective: Vital signs in last 24 hours: Temp:  [96.8 F (36 C)-97.7 F (36.5 C)] 97.6 F (36.4 C) (09/21 0732) Pulse Rate:  [56-75] 75 (09/21 0732) Resp:  [9-22] 20 (09/21 0349) BP: (146-177)/(67-96) 149/96 (09/21 0732) SpO2:  [98 %-100 %] 100 % (09/21 0732)  Intake/Output from previous day: 09/20 0701 - 09/21 0700 In: 0867 [P.O.:240; I.V.:900; IV Piggyback:150] Out: 7 [Blood:7] Intake/Output this shift: No intake/output data recorded.  Recent Labs    12/14/17 2046 12/15/17 0516  HGB 12.6 11.0*   Recent Labs    12/14/17 2046 12/15/17 0516  WBC 12.1* 10.4  RBC 4.31 3.70*  HCT 37.8 32.8*  PLT 222 165   Recent Labs    12/15/17 0516 12/16/17 0514  NA 141 140  K 3.4* 4.0  CL 108 108  CO2 24 24  BUN 15 10  CREATININE 0.72 0.78  GLUCOSE 172* 315*  CALCIUM 8.7* 9.0   No results for input(s): LABPT, INR in the last 72 hours.  EXAM General - Patient is Alert, Appropriate and Oriented Extremity -left hand with no swelling warmth or erythema.  Dressing clean dry and intact to left hand.  No tenderness to palpation throughout the hand or digits Dressing - dressing C/D/I Motor Function - intact, moving digits of the left hand well  Past Medical History:  Diagnosis Date  . Diabetes mellitus without complication (HCC)     Assessment/Plan:   1 Day Post-Op Procedure(s) (LRB): AMPUTATION DIGIT left long finger (Left) Active Problems:   Felon of finger of left hand   Osteomyelitis of finger of left hand (HCC)  Estimated body mass index is 34.5 kg/m as calculated from the following:   Height as of this encounter: 5\' 2"  (1.575 m).   Weight as of this encounter: 85.5 kg.  Patient doing well, pain controlled. Continue with IV cefepime, vancomycin.  P.o. Flagyl per  infectious disease Infectious disease following, will make decision on final antibiotics pending culture and bone pathology Patient will need dressing change Monday or Tuesday if still in hospital. If patient home by Monday or Tuesday, will need to follow up in the office. Sutures out 14 days post op.    Ronney Asters, PA-C [place] 12/16/2017, 7:45 AM

## 2017-12-17 LAB — CBC
HCT: 30.9 % — ABNORMAL LOW (ref 35.0–47.0)
HEMOGLOBIN: 10.5 g/dL — AB (ref 12.0–16.0)
MCH: 30.2 pg (ref 26.0–34.0)
MCHC: 33.9 g/dL (ref 32.0–36.0)
MCV: 89 fL (ref 80.0–100.0)
Platelets: 161 10*3/uL (ref 150–440)
RBC: 3.48 MIL/uL — ABNORMAL LOW (ref 3.80–5.20)
RDW: 13.7 % (ref 11.5–14.5)
WBC: 12.5 10*3/uL — AB (ref 3.6–11.0)

## 2017-12-17 LAB — BASIC METABOLIC PANEL
Anion gap: 9 (ref 5–15)
BUN: 21 mg/dL — ABNORMAL HIGH (ref 6–20)
CALCIUM: 9 mg/dL (ref 8.9–10.3)
CO2: 23 mmol/L (ref 22–32)
CREATININE: 0.99 mg/dL (ref 0.44–1.00)
Chloride: 111 mmol/L (ref 98–111)
GFR calc Af Amer: >60 mL/min (ref >60)
GFR calc non Af Amer: >60 mL/min (ref >60)
Glucose, Bld: 138 mg/dL — ABNORMAL HIGH (ref 70–99)
Potassium: 3.9 mmol/L (ref 3.5–5.1)
SODIUM: 143 mmol/L (ref 135–145)

## 2017-12-17 LAB — GLUCOSE, CAPILLARY
Glucose-Capillary: 151 mg/dL — ABNORMAL HIGH (ref 70–99)
Glucose-Capillary: 186 mg/dL — ABNORMAL HIGH (ref 70–99)
Glucose-Capillary: 126 mg/dL — ABNORMAL HIGH (ref 70–99)
Glucose-Capillary: 223 mg/dL — ABNORMAL HIGH (ref 70–99)

## 2017-12-17 LAB — HEMOGLOBIN A1C
Hgb A1c MFr Bld: 7.8 % — ABNORMAL HIGH (ref 4.8–5.6)
Mean Plasma Glucose: 177 mg/dL

## 2017-12-17 NOTE — Progress Notes (Signed)
   Subjective: 2 Days Post-Op Procedure(s) (LRB): AMPUTATION DIGIT left long finger (Left) Patient reports pain as 0 on 0-10 scale.   Patient is well, and has had no acute complaints or problems   Objective: Vital signs in last 24 hours: Temp:  [97.6 F (36.4 C)-98.4 F (36.9 C)] 97.6 F (36.4 C) (09/22 0737) Pulse Rate:  [55-75] 55 (09/22 0737) Resp:  [20] 20 (09/21 2323) BP: (124-149)/(66-94) 149/81 (09/22 0737) SpO2:  [98 %-100 %] 100 % (09/22 0737)  Intake/Output from previous day: No intake/output data recorded. Intake/Output this shift: No intake/output data recorded.  Recent Labs    12/14/17 2046 12/15/17 0516 12/17/17 0454  HGB 12.6 11.0* 10.5*   Recent Labs    12/15/17 0516 12/17/17 0454  WBC 10.4 12.5*  RBC 3.70* 3.48*  HCT 32.8* 30.9*  PLT 165 161   Recent Labs    12/16/17 0514 12/17/17 0454  NA 140 143  K 4.0 3.9  CL 108 111  CO2 24 23  BUN 10 21*  CREATININE 0.78 0.99  GLUCOSE 315* 138*  CALCIUM 9.0 9.0   No results for input(s): LABPT, INR in the last 72 hours.  EXAM General - Patient is Alert, Appropriate and Oriented Extremity -left hand with no swelling warmth or erythema.  Dressing clean dry and intact to left hand.  No tenderness to palpation throughout the hand or digits Dressing - dressing C/D/I Motor Function - intact, moving digits of the left hand well  Past Medical History:  Diagnosis Date  . Diabetes mellitus without complication (HCC)     Assessment/Plan:   2 Days Post-Op Procedure(s) (LRB): AMPUTATION DIGIT left long finger (Left) Active Problems:   Felon of finger of left hand   Osteomyelitis of finger of left hand (HCC)  Estimated body mass index is 34.5 kg/m as calculated from the following:   Height as of this encounter: 5\' 2"  (1.575 m).   Weight as of this encounter: 85.5 kg.  Patient doing well, pain controlled. Continue with IV and biotics per infectious disease Infectious disease following, will make  decision on final antibiotics pending culture and bone pathology.  Culture still pending Patient will need dressing change Monday or Tuesday if still in hospital. If patient home by Monday will need to follow up in the office Monday or Tuesday. Sutures will need to be removed 14 days post op.    Ronney Asters, PA-C [place] 12/17/2017, 8:19 AM

## 2017-12-17 NOTE — Progress Notes (Signed)
Noted pt ambulating without assistance and using left hand frequently. Drainage noted. Pt also pulled out IV. AB not given. IV consult placed

## 2017-12-17 NOTE — Progress Notes (Signed)
[place] at [place] NAME: Sarah [name]    MR#:  [id number]  DATE OF BIRTH:  1955-04-30  SUBJECTIVE:  CHIEF COMPLAINT:   Chief Complaint  Patient presents with  . Cellulitis   -Left hand middle finger dressing in place.  No complaints. -Awaiting final pathology results.  REVIEW OF SYSTEMS:  Review of Systems  Constitutional: Negative for chills, fever and malaise/fatigue.  HENT: Negative for congestion, ear discharge, hearing loss and nosebleeds.   Eyes: Negative for blurred vision and double vision.  Respiratory: Negative for cough, shortness of breath and wheezing.   Cardiovascular: Negative for chest pain and palpitations.  Gastrointestinal: Negative for abdominal pain, constipation, diarrhea, nausea and vomiting.  Genitourinary: Negative for dysuria.  Musculoskeletal: Positive for joint pain and myalgias.  Neurological: Negative for dizziness, focal weakness, seizures, weakness and headaches.    DRUG ALLERGIES:   Allergies  Allergen Reactions  . Ace Inhibitors Nausea Only  . Farxiga [Dapagliflozin]     Vaginitis     VITALS:  Blood pressure (!) 149/81, pulse (!) 55, temperature 97.6 F (36.4 C), temperature source Oral, resp. rate 20, height 5\' 2"  (1.575 m), weight 85.5 kg, SpO2 100 %.  PHYSICAL EXAMINATION:  Physical Exam  GENERAL:  62 y.o.-year-old obese patient lying in the bed with no acute distress.  EYES: Pupils equal, round, reactive to light and accommodation. No scleral icterus. Extraocular muscles intact.  HEENT: Head atraumatic, normocephalic. Oropharynx and nasopharynx clear.  NECK:  Supple, no jugular venous distention. No thyroid enlargement, no tenderness.  LUNGS: Normal breath sounds bilaterally, no wheezing, rales,rhonchi or crepitation. No use of accessory muscles of respiration.  CARDIOVASCULAR: S1, S2 normal. No murmurs, rubs, or gallops.  ABDOMEN: Soft, nontender, nondistended. Bowel sounds  present. No organomegaly or mass.  EXTREMITIES: No pedal edema, cyanosis, or clubbing.  Left hand middle finger dressing in place. NEUROLOGIC: Cranial nerves II through XII are intact. Muscle strength 5/5 in all extremities. Sensation intact. Gait not checked.  PSYCHIATRIC: The patient is alert and oriented x 3.  SKIN: No obvious rash, lesion, or ulcer.    LABORATORY PANEL:   CBC Recent Labs  Lab 12/17/17 0454  WBC 12.5*  HGB 10.5*  HCT 30.9*  PLT 161   ------------------------------------------------------------------------------------------------------------------  Chemistries  Recent Labs  Lab 12/14/17 2046  12/17/17 0454  NA 140   < > 143  K 3.6   < > 3.9  CL 106   < > 111  CO2 25   < > 23  GLUCOSE 173*   < > 138*  BUN 16   < > 21*  CREATININE 0.86   < > 0.99  CALCIUM 9.6   < > 9.0  AST 21  --   --   ALT 15  --   --   ALKPHOS 76  --   --   BILITOT 0.4  --   --    < > = values in this interval not displayed.   ------------------------------------------------------------------------------------------------------------------  Cardiac Enzymes No results for input(s): TROPONINI in the last 168 hours. ------------------------------------------------------------------------------------------------------------------  RADIOLOGY:  No results found.  EKG:  No orders found for this or any previous visit.  ASSESSMENT AND PLAN:   62 year old female with past medical history significant for diabetes, hypertension and hypothyroidism presents to hospital secondary to left middle finger tip pain, swelling and discharge.  1.  Left hand middle finger felon and osteomyelitis-started after deep clipping of her nail. -Appreciate Ortho  consult and ID consult -X-ray confirming osteomyelitis of the distal phalanx with pathological fracture.  Status post surgery and amputation of distal phalanx on 12/15/2017. -Awaiting final culture results.  On vancomycin, cefepime and oral  Flagyl. -We will await ID input to find out the duration and the type of antibiotics at discharge  2.  Diabetes mellitus-sugars are still elevated.  Restarted her Amaryl and metformin.  Sliding scale insulin  3.  Hypothyroidism-continue Tapazole  4.  Hypertension-on metoprolol and hydrochlorothiazide.  5.  DVT prophylaxis-Lovenox  Up and ambulatory   All the records are reviewed and case discussed with Care Management/Social Worker. Management plans discussed with the patient, family and they are in agreement.  CODE STATUS: Full code  TOTAL TIME TAKING CARE OF THIS PATIENT: 36 minutes.   POSSIBLE D/C IN 1-2 DAYS, DEPENDING ON CLINICAL CONDITION.   Gladstone Lighter M.D on 12/17/2017 at 9:08 AM  Between 7am to 6pm - Pager - [phone]  After 6pm go to [email or web address] - password [email or web address] [facility]  Office  [phone]  CC: Primary care physician; Tawni Millers, MD

## 2017-12-18 ENCOUNTER — Ambulatory Visit: Payer: Self-pay | Admitting: Internal Medicine

## 2017-12-18 DIAGNOSIS — E1169 Type 2 diabetes mellitus with other specified complication: Secondary | ICD-10-CM

## 2017-12-18 DIAGNOSIS — L089 Local infection of the skin and subcutaneous tissue, unspecified: Secondary | ICD-10-CM

## 2017-12-18 DIAGNOSIS — Z79899 Other long term (current) drug therapy: Secondary | ICD-10-CM

## 2017-12-18 DIAGNOSIS — E0590 Thyrotoxicosis, unspecified without thyrotoxic crisis or storm: Secondary | ICD-10-CM

## 2017-12-18 DIAGNOSIS — I10 Essential (primary) hypertension: Secondary | ICD-10-CM

## 2017-12-18 DIAGNOSIS — M869 Osteomyelitis, unspecified: Secondary | ICD-10-CM

## 2017-12-18 DIAGNOSIS — M84445S Pathological fracture, left finger(s), sequela: Secondary | ICD-10-CM

## 2017-12-18 DIAGNOSIS — Z89022 Acquired absence of left finger(s): Secondary | ICD-10-CM

## 2017-12-18 LAB — GLUCOSE, CAPILLARY
GLUCOSE-CAPILLARY: 89 mg/dL (ref 70–99)
GLUCOSE-CAPILLARY: 161 mg/dL — AB (ref 70–99)
GLUCOSE-CAPILLARY: 240 mg/dL — AB (ref 70–99)
GLUCOSE-CAPILLARY: 179 mg/dL — AB (ref 70–99)
GLUCOSE-CAPILLARY: 141 mg/dL — AB (ref 70–99)
Glucose-Capillary: 42 mg/dL — CL (ref 70–99)
Glucose-Capillary: 205 mg/dL — ABNORMAL HIGH (ref 70–99)

## 2017-12-18 LAB — AEROBIC CULTURE  (SUPERFICIAL SPECIMEN)

## 2017-12-18 LAB — AEROBIC CULTURE W GRAM STAIN (SUPERFICIAL SPECIMEN)

## 2017-12-18 MED ORDER — INSULIN ASPART 100 UNIT/ML ~~LOC~~ SOLN
0.0000 [IU] | Freq: Three times a day (TID) | SUBCUTANEOUS | Status: DC
  Administered 2017-12-18: 2 [IU] via SUBCUTANEOUS
  Administered 2017-12-19 (×2): 3 [IU] via SUBCUTANEOUS
  Filled 2017-12-18 (×3): qty 1

## 2017-12-18 MED ORDER — SODIUM CHLORIDE 0.9 % IV SOLN
3.0000 g | Freq: Four times a day (QID) | INTRAVENOUS | Status: DC
  Administered 2017-12-18 – 2017-12-19 (×4): 3 g via INTRAVENOUS
  Filled 2017-12-18 (×9): qty 3

## 2017-12-18 NOTE — Progress Notes (Signed)
[place] [facility] [place] NAME: Raffaela Ladley    MR#:  [id number]  DATE OF BIRTH:  Dec 09, 1955  SUBJECTIVE:  CHIEF COMPLAINT:   Chief Complaint  Patient presents with  . Cellulitis   -Left hand middle finger dressing in place.  No complaints. -Awaiting final pathology results.  REVIEW OF SYSTEMS:  Review of Systems  Constitutional: Negative for chills, fever and malaise/fatigue.  HENT: Negative for congestion, ear discharge, hearing loss and nosebleeds.   Eyes: Negative for blurred vision and double vision.  Respiratory: Negative for cough, shortness of breath and wheezing.   Cardiovascular: Negative for chest pain and palpitations.  Gastrointestinal: Negative for abdominal pain, constipation, diarrhea, nausea and vomiting.  Genitourinary: Negative for dysuria.  Musculoskeletal: Positive for joint pain and myalgias.  Neurological: Negative for dizziness, focal weakness, seizures, weakness and headaches.    DRUG ALLERGIES:   Allergies  Allergen Reactions  . Ace Inhibitors Nausea Only  . Farxiga [Dapagliflozin]     Vaginitis     VITALS:  Blood pressure (!) 178/94, pulse 62, temperature 97.9 F (36.6 C), temperature source Oral, resp. rate 20, height 5\' 2"  (1.575 m), weight 85.5 kg, SpO2 100 %.  PHYSICAL EXAMINATION:  Physical Exam  GENERAL:  62 y.o.-year-old obese patient lying in the bed with no acute distress.  EYES: Pupils equal, round, reactive to light and accommodation. No scleral icterus. Extraocular muscles intact.  HEENT: Head atraumatic, normocephalic. Oropharynx and nasopharynx clear.  NECK:  Supple, no jugular venous distention. No thyroid enlargement, no tenderness.  LUNGS: Normal breath sounds bilaterally, no wheezing, rales,rhonchi or crepitation. No use of accessory muscles of respiration.  CARDIOVASCULAR: S1, S2 normal. No murmurs, rubs, or gallops.  ABDOMEN: Soft, nontender, nondistended. Bowel sounds present.  No organomegaly or mass.  EXTREMITIES: No pedal edema, cyanosis, or clubbing.  Left hand middle finger dressing in place. NEUROLOGIC: Cranial nerves II through XII are intact. Muscle strength 5/5 in all extremities. Sensation intact. Gait not checked.  PSYCHIATRIC: The patient is alert and oriented x 3.  SKIN: No obvious rash, lesion, or ulcer.    LABORATORY PANEL:   CBC Recent Labs  Lab 12/17/17 0454  WBC 12.5*  HGB 10.5*  HCT 30.9*  PLT 161   ------------------------------------------------------------------------------------------------------------------  Chemistries  Recent Labs  Lab 12/14/17 2046  12/17/17 0454  NA 140   < > 143  K 3.6   < > 3.9  CL 106   < > 111  CO2 25   < > 23  GLUCOSE 173*   < > 138*  BUN 16   < > 21*  CREATININE 0.86   < > 0.99  CALCIUM 9.6   < > 9.0  AST 21  --   --   ALT 15  --   --   ALKPHOS 76  --   --   BILITOT 0.4  --   --    < > = values in this interval not displayed.   ------------------------------------------------------------------------------------------------------------------  Cardiac Enzymes No results for input(s): TROPONINI in the last 168 hours. ------------------------------------------------------------------------------------------------------------------  RADIOLOGY:  No results found.  EKG:  No orders found for this or any previous visit.  ASSESSMENT AND PLAN:   62 year old female with past medical history significant for diabetes, hypertension and hypothyroidism presents to hospital secondary to left middle finger tip pain, swelling and discharge.  1.  Left hand middle finger felon and osteomyelitis-started after deep clipping of her nail. -Appreciate Ortho consult  and ID consult -X-ray confirming osteomyelitis of the distal phalanx with pathological fracture.  Status post surgery and amputation of distal phalanx on 12/15/2017. -Final cultures are negative.  Fungal and AFB cultures are pending.  Pathology from  the proximal part is pending to rule out osteomyelitis on that side.  That will determine if patient will need IV or oral antibiotics at discharge. -Discontinued vancomycin and Flagyl.  Currently only on Unasyn.   2.  Diabetes mellitus-sugars are still elevated.  on Amaryl and metformin.  Sliding scale insulin  3.  Hypothyroidism-continue Tapazole  4.  Hypertension-on metoprolol and hydrochlorothiazide.  5.  DVT prophylaxis-Lovenox  Up and ambulatory Discharge tomorrow after pathology results   All the records are reviewed and case discussed with Care Management/Social Worker. Management plans discussed with the patient, family and they are in agreement.  CODE STATUS: Full code  TOTAL TIME TAKING CARE OF THIS PATIENT: 37 minutes.   POSSIBLE D/C IN 1-2 DAYS, DEPENDING ON CLINICAL CONDITION.   Gladstone Lighter M.D on 12/18/2017 at 2:32 PM  Between 7am to 6pm - Pager - [phone]  After 6pm go to [email or web address] - password [email or web address] [facility]  Office  [phone]  CC: Primary care physician; Tawni Millers, MD

## 2017-12-18 NOTE — Progress Notes (Signed)
IS education attempted. Pt sleeping, IS left in room. Will re-attempt later.

## 2017-12-18 NOTE — Progress Notes (Signed)
IS education completed. Pt understands technique and reason for use. Pt inspire 1774ml+. Pt independent with use.

## 2017-12-18 NOTE — Progress Notes (Signed)
Pharmacy Antibiotic Note  Sarah [name] is a 62 y.o. female admitted on 12/14/2017 with osteomyelitis of finger.  Pharmacy has been consulted for vancomycin and cefepime dosing.  Pre-op culture from finger growing E. Corrodens.  Intra-op cultures from tissue and bone are unrevealing.  ID changing to ampicillin/sulbactam 9/23   Plan:  Ampicillin/sulbactam 3gm IV q6h   Pharmacy to follow at distance - do not anticipate need for dose adjustment  Height: 5\' 2"  (157.5 cm) Weight: 188 lb 9.6 oz (85.5 kg) IBW/kg (Calculated) : 50.1  Temp (24hrs), Avg:98 F (36.7 C), Min:97.8 F (36.6 C), Max:98.2 F (36.8 C)  Recent Labs  Lab 12/14/17 2046 12/14/17 2309 12/15/17 0516 12/16/17 0514 12/16/17 2024 12/17/17 0454  WBC 12.1*  --  10.4  --   --  12.5*  CREATININE 0.86  --  0.72 0.78  --  0.99  LATICACIDVEN 2.0* 1.9  --   --   --   --   VANCOTROUGH  --   --   --   --  16  --     Estimated Creatinine Clearance: 61.3 mL/min (by C-G formula based on SCr of 0.99 mg/dL).    Allergies  Allergen Reactions  . Ace Inhibitors Nausea Only  . Farxiga [Dapagliflozin]     Vaginitis     Antimicrobials this admission: Vancomycin, cefepime 9/20  >>    >> 9/23 9/23 >> amp/sulb >>  Dose adjustments this admission:   Microbiology results: 9/19 BCx: NGTD 9/20 Wound: rare  E. Corrodens 9/20 Tissue: NGTD 9/20 Bone: NGTD 9/20 AFB: pending 9/20 Fungal: pending  Thank you for allowing pharmacy to be a part of this patient's care.  Doreene Eland, PharmD, BCPS.   Work Cell: [phone] 12/18/2017 10:34 AM

## 2017-12-18 NOTE — Progress Notes (Signed)
Sarah [name] is a 62 y.o. female with a history of diabetes mellitus hyperthyroidism Presented to the emergency department with swelling of the left middle finger, foul-smelling discharge for the past week.  Patient states she clipped the nail with a nail cutter and then pulled a hangnail with her teeth.  She had some bleeding initially.  This was 2 to 3 weeks ago.  Then as it was getting progressively worse with erythema pain and swelling of her finger she went to her primary care physician and was started on Keflex which has not helped her much.  She was sent to the emergency department as it was not getting better.  She has no fever or chills.   In the ED her temperature was 97.9 blood pressure was 147/86 WBC was 12.1, creatinine 0.86 she had an x-ray of the middle finger and that showed osteomyelitis of the third distal phalanx along with pathological fracture.  She was started on IV vancomycin and cefepime.  I been asked to see the patient for management of her osteomyelitis.  Patient states she sustained an injury to the middle finger 20 years ago when  it was caught in a machine and had surgery  O/e she had an infected left middle finger and underwent amputation of the Distal phalanx on Friday evening. Bone pathology is still pending- Bone culture has not finalized The culture done from the wound had eikenella BP (!) 178/94 (BP Location: Left Arm)   Pulse 62   Temp 97.9 F (36.6 C) (Oral)   Resp 20   Ht 5\' 2"  (1.575 m)   Wt 85.5 kg   SpO2 100%   BMI 34.50 kg/m  Left finger      amputated site looks clean with no discharge or erythema  Infection of the left middle finger with  osteomyelitis.  s/p amputation of the Distal phalanx Awaiting on bone pathology and culture Change cefepime/vanco and flagyl to unasyn IF proximal marin is clear she will go on augmentin If proximal margin still shows OM will then need IV antibiotic  Diabetes mellitus management as per primary  team  Hyperthyroidism on methimazole  Hypertension : On metoprolol and hydrochlorothiazide  On atorvastatin 10 mg.  Discussed the management with the patient and her sisters.    Discussed with Dr. Marry Guan and hospitalist Discussed with pathologist- sample is being processed

## 2017-12-18 NOTE — Care Management (Signed)
Patient active with [facility]. She is working with the hospital to get Medicaid started.

## 2017-12-18 NOTE — Progress Notes (Signed)
   Subjective: 3 Days Post-Op Procedure(s) (LRB): AMPUTATION DIGIT left long finger (Left) Patient reports pain as 0 on 0-10 scale.   Patient is well, and has had no acute complaints or problems   Objective: Vital signs in last 24 hours: Temp:  [97.8 F (36.6 C)-98.2 F (36.8 C)] 97.9 F (36.6 C) (09/23 0755) Pulse Rate:  [52-65] 52 (09/23 0755) Resp:  [20] 20 (09/22 2322) BP: (153-178)/(82-94) 178/94 (09/23 0755) SpO2:  [99 %-100 %] 100 % (09/23 0755)  Intake/Output from previous day: 09/22 0701 - 09/23 0700 In: 181.8 [P.O.:120; I.V.:61.8] Out: -  Intake/Output this shift: No intake/output data recorded.  Recent Labs    12/17/17 0454  HGB 10.5*   Recent Labs    12/17/17 0454  WBC 12.5*  RBC 3.48*  HCT 30.9*  PLT 161   Recent Labs    12/16/17 0514 12/17/17 0454  NA 140 143  K 4.0 3.9  CL 108 111  CO2 24 23  BUN 10 21*  CREATININE 0.78 0.99  GLUCOSE 315* 138*  CALCIUM 9.0 9.0   No results for input(s): LABPT, INR in the last 72 hours.  EXAM General - Patient is Alert, Appropriate and Oriented Extremity -left hand with no swelling warmth or erythema.  Dressing clean dry and intact to left hand.  No tenderness to palpation throughout the hand or digits.  Incision site is intact, no drainage. Dressing - dressing C/D/I.  Dressing changed, incision site clean with chlorhexidine alcohol new dressing with Vaseline gauze and Kerlix applied.   Motor Function - intact, moving digits of the left hand well  Past Medical History:  Diagnosis Date  . Diabetes mellitus without complication (HCC)     Assessment/Plan:   3 Days Post-Op Procedure(s) (LRB): AMPUTATION DIGIT left long finger (Left) Active Problems:   Felon of finger of left hand   Osteomyelitis of finger of left hand (HCC)  Estimated body mass index is 34.5 kg/m as calculated from the following:   Height as of this encounter: 5\' 2"  (1.575 m).   Weight as of this encounter: 85.5 kg.  Patient doing  well, pain controlled. Continue with IV and biotics per infectious disease Infectious disease following, will make decision on final antibiotics pending culture and bone pathology. Dressing changed today.  Wound appears well.  No drainage.  Patient will need follow-up 12/29/2017 for suture removal.   T. Rachelle Hora, PA-C [place] 12/18/2017, 8:15 AM

## 2017-12-18 NOTE — Discharge Instructions (Signed)
Please keep dressing clean and dry at all times.  Follow-up with [facility] on 12/29/2017 for wound check, suture removal, Steri-Strip application.

## 2017-12-19 ENCOUNTER — Inpatient Hospital Stay: Payer: Self-pay

## 2017-12-19 DIAGNOSIS — Z79899 Other long term (current) drug therapy: Secondary | ICD-10-CM

## 2017-12-19 DIAGNOSIS — Z888 Allergy status to other drugs, medicaments and biological substances status: Secondary | ICD-10-CM

## 2017-12-19 DIAGNOSIS — E0590 Thyrotoxicosis, unspecified without thyrotoxic crisis or storm: Secondary | ICD-10-CM

## 2017-12-19 DIAGNOSIS — L089 Local infection of the skin and subcutaneous tissue, unspecified: Secondary | ICD-10-CM

## 2017-12-19 DIAGNOSIS — I10 Essential (primary) hypertension: Secondary | ICD-10-CM

## 2017-12-19 DIAGNOSIS — E1169 Type 2 diabetes mellitus with other specified complication: Secondary | ICD-10-CM

## 2017-12-19 DIAGNOSIS — Z89022 Acquired absence of left finger(s): Secondary | ICD-10-CM

## 2017-12-19 DIAGNOSIS — M869 Osteomyelitis, unspecified: Secondary | ICD-10-CM

## 2017-12-19 DIAGNOSIS — B9689 Other specified bacterial agents as the cause of diseases classified elsewhere: Secondary | ICD-10-CM

## 2017-12-19 DIAGNOSIS — M84445S Pathological fracture, left finger(s), sequela: Secondary | ICD-10-CM

## 2017-12-19 LAB — AEROBIC/ANAEROBIC CULTURE (SURGICAL/DEEP WOUND): GRAM STAIN: NONE SEEN

## 2017-12-19 LAB — ACID FAST SMEAR (AFB, MYCOBACTERIA)
Acid Fast Smear: NEGATIVE
Acid Fast Smear: NEGATIVE

## 2017-12-19 LAB — CBC
HCT: 35 % (ref 35.0–47.0)
Hemoglobin: 11.9 g/dL — ABNORMAL LOW (ref 12.0–16.0)
MCH: 29.6 pg (ref 26.0–34.0)
MCHC: 33.9 g/dL (ref 32.0–36.0)
MCV: 87.5 fL (ref 80.0–100.0)
Platelets: 172 10*3/uL (ref 150–440)
RBC: 4 MIL/uL (ref 3.80–5.20)
RDW: 13.8 % (ref 11.5–14.5)
WBC: 9.8 10*3/uL (ref 3.6–11.0)

## 2017-12-19 LAB — CULTURE, BLOOD (SINGLE)
CULTURE: NO GROWTH
SPECIAL REQUESTS: ADEQUATE

## 2017-12-19 LAB — GLUCOSE, CAPILLARY
Glucose-Capillary: 153 mg/dL — ABNORMAL HIGH (ref 70–99)
Glucose-Capillary: 194 mg/dL — ABNORMAL HIGH (ref 70–99)

## 2017-12-19 LAB — SURGICAL PATHOLOGY

## 2017-12-19 LAB — AEROBIC/ANAEROBIC CULTURE W GRAM STAIN (SURGICAL/DEEP WOUND)

## 2017-12-19 LAB — SEDIMENTATION RATE: Sed Rate: 43 mm/hr — ABNORMAL HIGH (ref 0–30)

## 2017-12-19 LAB — ACID FAST SMEAR (AFB)

## 2017-12-19 MED ORDER — AMOXICILLIN-POT CLAVULANATE 875-125 MG PO TABS: 1.0000 | ORAL_TABLET | Freq: Two times a day (BID) | ORAL | 0 refills | Status: DC

## 2017-12-19 MED ORDER — AMLODIPINE BESYLATE 5 MG PO TABS: 5.0000 mg | ORAL_TABLET | Freq: Every day | ORAL | 11 refills | Status: DC

## 2017-12-19 MED ORDER — AMPICILLIN-SULBACTAM IV (FOR PTA / DISCHARGE USE ONLY): 3.0000 g | Freq: Four times a day (QID) | INTRAVENOUS | 0 refills | Status: AC

## 2017-12-19 MED ORDER — SODIUM CHLORIDE 0.9% FLUSH: 10.0000 mL | INTRAVENOUS | Status: DC | PRN

## 2017-12-19 MED ORDER — OXYCODONE HCL 5 MG PO TABS: 5.0000 mg | ORAL_TABLET | Freq: Four times a day (QID) | ORAL | 0 refills | Status: DC | PRN

## 2017-12-19 MED ORDER — SODIUM CHLORIDE 0.9 % IV SOLN: 3.0000 g | Freq: Four times a day (QID) | INTRAVENOUS | 0 refills | Status: DC

## 2017-12-19 NOTE — Progress Notes (Signed)
Pt turns self. Pt is ambulatory.

## 2017-12-19 NOTE — Treatment Plan (Signed)
Diagnosis: Osteomyelitis of the left middle finger Baseline Creatinine -0.99 Culture Result: Eikenella  Allergies  Allergen Reactions  . Ace Inhibitors Nausea Only  . Farxiga [Dapagliflozin]     Vaginitis     OPAT Orders Discharge antibiotics: Unasyn 3 grams IV every 6 hours until 01/15/18  [facility] Care Per Protocol:  Labs weekly on Thursday  while on IV antibiotics: _X_ CBC with differential _X__ CMP  X__ ESR   _X_ Please pull PIC at completion of IV antibiotics  Fax weekly labs to [phone]  Call dr.Wyett Narine [phone] with any critical lab or questions Follow up appt next week

## 2017-12-19 NOTE — Care Management Note (Signed)
Case Management Note  Patient Details  Name: Sarah [name] MRN: [id number] Date of Birth: 1955/10/20  Subjective/Objective:   Patient will now go home with IV antibiotics due to positive blood cultures and osteomyelitis. Patient will need RN for initial teaching of IV antibiotic administration. She will discharge home on Unasyn 3 gm Q 6 hours x 4 weeks. Notified Jason with [facility]. Patient uninsured. He will do a financial assessment. Abx order in                 Action/Plan:   Expected Discharge Date:  12/19/17               Expected Discharge Plan:  [place]  In-House Referral:     Discharge planning Services  CM Consult  Post Acute Care Choice:  Durable Medical Equipment, Home Health Choice offered to:  Patient  DME Arranged:  IV pump/equipment DME Agency:  [facility] Arranged:  RN [facility] Agency:  [facility]  Status of Service:  Completed, signed off  If discussed at [place] of Stay Meetings, dates discussed:    Additional Comments:  Jolly Mango, RN 12/19/2017, 2:00 PM

## 2017-12-19 NOTE — Progress Notes (Signed)
PHARMACY CONSULT NOTE FOR:  OUTPATIENT  PARENTERAL ANTIBIOTIC THERAPY (OPAT)  Indication: osteomyelitis of finger Regimen: ampicillin/sulbactam 3gm IV q6h End date: 01/15/2018  IV antibiotic discharge orders are pended. To discharging provider:  please sign these orders via discharge navigator,  Select New Orders & click on the button choice - Manage This Unsigned Work.     Thank you for allowing pharmacy to be a part of this patient's care.  Doreene Eland, PharmD, BCPS.   Work Cell: [phone] 12/19/2017 2:35 PM

## 2017-12-19 NOTE — Progress Notes (Signed)
   Subjective: 4 Days Post-Op Procedure(s) (LRB): AMPUTATION DIGIT left long finger (Left) Patient reports pain as mild.   Patient is well, and has had no acute complaints or problems Okay to do physical therapy.  Patient up ambulating on her own. Resting well.  Voicing no complaints. No fevers or chills. Plan is to go Home after hospital stay. no nausea and no vomiting Patient denies any chest pains or shortness of breath. Objective: Vital signs in last 24 hours: Temp:  [97.7 F (36.5 C)-97.9 F (36.6 C)] 97.7 F (36.5 C) (09/23 2312) Pulse Rate:  [52-64] 55 (09/23 2312) Resp:  [17] 17 (09/23 2312) BP: (146-180)/(68-94) 146/68 (09/23 2312) SpO2:  [100 %] 100 % (09/23 2312) well approximated incision Incision without drainage or any signs of infection. Minimal swelling  Intake/Output from previous day: 09/23 0701 - 09/24 0700 In: 1117.1 [P.O.:960; I.V.:57.1; IV Piggyback:100] Out: 0  Intake/Output this shift: No intake/output data recorded.  Recent Labs    12/17/17 0454 12/19/17 0559  HGB 10.5* 11.9*   Recent Labs    12/17/17 0454 12/19/17 0559  WBC 12.5* 9.8  RBC 3.48* 4.00  HCT 30.9* 35.0  PLT 161 172   Recent Labs    12/17/17 0454  NA 143  K 3.9  CL 111  CO2 23  BUN 21*  CREATININE 0.99  GLUCOSE 138*  CALCIUM 9.0   No results for input(s): LABPT, INR in the last 72 hours.  EXAM General - Patient is Alert, Appropriate and Oriented Extremity - Neurologically intact Neurovascular intact Sensation intact distally Intact pulses distally No cellulitis present Dressing - dressing C/D/I Motor Function - intact, moving fingers well on today's visit.     Past Medical History:  Diagnosis Date  . Diabetes mellitus without complication (HCC)     Assessment/Plan: 4 Days Post-Op Procedure(s) (LRB): AMPUTATION DIGIT left long finger (Left) Active Problems:   Felon of finger of left hand   Osteomyelitis of finger of left hand (HCC)  Estimated body  mass index is 34.5 kg/m as calculated from the following:   Height as of this encounter: 5\' 2"  (1.575 m).   Weight as of this encounter: 85.5 kg. Discharge home with home health when medically cleared  Labs: Culture showed no growth of tissue or bone after 2 days DVT Prophylaxis - None Change dressing as needed. Patient will need to be seen in the clinic in 1 week Should be able to discharge patient home on Augmentin as recommended by infectious disease provided no organisms seen on growth cultures.  Jillyn Ledger. Homer Dunning 12/19/2017, 7:53 AM

## 2017-12-19 NOTE — Care Management (Signed)
Patient assessed by [facility]. She is independent, ambulatory. Not home bound. No scheduled dressing changes needed. Can be taught to perform needed dressing changes by nursing staff before discharge. No discharge needs identified. Case closed.

## 2017-12-19 NOTE — Progress Notes (Signed)
Peripherally Inserted Central Catheter/Midline Placement  The IV Nurse has discussed with the patient and/or persons authorized to consent for the patient, the purpose of this procedure and the potential benefits and risks involved with this procedure.  The benefits include less needle sticks, lab draws from the catheter, and the patient may be discharged home with the catheter. Risks include, but not limited to, infection, bleeding, blood clot (thrombus formation), and puncture of an artery; nerve damage and irregular heartbeat and possibility to perform a PICC exchange if needed/ordered by physician.  Alternatives to this procedure were also discussed.  [employer] PICC patient education guide, fact sheet on infection prevention and patient information card has been provided to patient /or left at bedside.    PICC/Midline Placement Documentation        Sarah, [name] 12/19/2017, 5:38 PM

## 2017-12-19 NOTE — Progress Notes (Signed)
Pt discharged to home via wheelchair without incident per MD order accompanied by spouse. Prior to discharge, PICC line placed and education completed with patient by home health agency. IV antibiotic to be delivered to home tonight. Pt aware she is to pick up her antibiotic and take it until home health arrives in AM. No change in patient from AM assessment. Pt pain tolerable on discharge. Prior to d/c all discharge teachings done both written and verbal. Pt/family verbalize understanding and agree to comply.

## 2017-12-19 NOTE — Progress Notes (Signed)
Sarah [name] is a 62 y.o. female with a history of diabetes mellitus hyperthyroidism Presented to the emergency department with swelling of the left middle finger, foul-smelling discharge for the past week.  Patient states she clipped the nail with a nail cutter and then pulled a hangnail with her teeth.  She had some bleeding initially.  This was 2 to 3 weeks ago.  Then as it was getting progressively worse with erythema pain and swelling of her finger she went to her primary care physician and was started on Keflex which has not helped her much.  She was sent to the emergency department as it was not getting better.  She has no fever or chills.   In the ED her temperature was 97.9 blood pressure was 147/86 WBC was 12.1, creatinine 0.86 she had an x-ray of the middle finger and that showed osteomyelitis of the third distal phalanx along with pathological fracture.  She was started on IV vancomycin and cefepime.  I been asked to see the patient for management of her osteomyelitis.  Patient states she sustained an injury to the middle finger 20 years ago when  it was caught in a machine and had surgery  O/e she had an infected left middle finger and underwent amputation of the Distal phalanx on Friday evening. Bone pathology reported this afternoon Osteomyelitis at the proximal margin  The culture done from the wound had eikenella  BP (!) 161/92 (BP Location: Right Arm)   Pulse 72   Temp 98 F (36.7 C) (Oral)   Resp 18   Ht 5\' 2"  (1.575 m)   Wt 85.5 kg   SpO2 100%   BMI 34.50 kg/m  Left finger      Bony enlargement  with some tenderness Surgical site is okay- just a little tight. Pt told to keep her hand elevated    amputated site looks clean with no discharge or erythema  Infection of the left middle finger with  osteomyelitis.  s/p amputation of the Distal phalanx Proximal margin positive for osteo Will be discharged on IV unasyn 3 grams Q 6 for 4 weeks ( may be able to  switch to PO earlier )  Diabetes mellitus management as per primary team  Hyperthyroidism on methimazole  Hypertension : On metoprolol and hydrochlorothiazide  On atorvastatin 10 mg.  Discussed the management with the patient and her husband at bedside Discussed with hospitalist

## 2017-12-21 ENCOUNTER — Telehealth: Payer: Self-pay | Admitting: Adult Health Nurse Practitioner

## 2017-12-21 ENCOUNTER — Ambulatory Visit: Payer: Medicaid Other | Admitting: Ophthalmology

## 2017-12-21 NOTE — Telephone Encounter (Signed)
Called pt back to reschedule eye appt that was missed today.

## 2017-12-22 ENCOUNTER — Telehealth: Payer: Self-pay

## 2017-12-22 NOTE — Telephone Encounter (Signed)
SW Mardene Celeste at [facility] and asked per Staci Acosta was the patient being followed by the surgeon and would the orders be signed by him?  Mardene Celeste explained that the orders are normally signed by the PCP but the surgeon is apart of the continuum of care.  Approval was given by Teah for the orders.

## 2017-12-22 NOTE — Discharge Summary (Signed)
[place] [facility] [place] NAME: Sarah [name]    MR#:  [id number]  DATE OF BIRTH:  1955-12-08  DATE OF ADMISSION:  12/14/2017   ADMITTING PHYSICIAN: Sela Hua, MD  DATE OF DISCHARGE: 12/19/2017  6:32 PM  PRIMARY CARE PHYSICIAN: Tawni Millers, MD   ADMISSION DIAGNOSIS:   Cellulitis, unspecified cellulitis site [L03.90] Osteomyelitis of finger of left hand ([place]) [M86.9]  DISCHARGE DIAGNOSIS:   Active Problems:   Felon of finger of left hand   Osteomyelitis of finger of left hand ([place])   SECONDARY DIAGNOSIS:   Past Medical History:  Diagnosis Date  . Diabetes mellitus without complication [facility])     HOSPITAL COURSE:   62 year old female with past medical history significant for diabetes, hypertension and hypothyroidism presents to hospital secondary to left middle finger tip pain, swelling and discharge.  1.  Left hand middle finger felon and osteomyelitis-started after deep clipping of her nail. -Appreciate Ortho consult and ID consult -X-ray confirming osteomyelitis of the distal phalanx with pathological fracture.  Status post surgery and amputation of distal phalanx on 12/15/2017. -Final cultures are negative.  Fungal and AFB cultures are pending.  Pathology from the proximal part is consistent with osteomyelitis on that side.  - appreciate ID consult - will be discharged home on 4 weeks of Iv unasyn.   2.  Diabetes mellitus- on Amaryl and metformin.   3.  Hypothyroidism-continue Tapazole  4.  Hypertension-on metoprolol and norvasc added.   Up and ambulatory Discharge today  DISCHARGE CONDITIONS:   Guarded  CONSULTS OBTAINED:   Treatment Team:  Tsosie Billing, MD Dereck Leep, MD  DRUG ALLERGIES:   Allergies  Allergen Reactions  . Ace Inhibitors Nausea Only  . Farxiga [Dapagliflozin]     Vaginitis    DISCHARGE MEDICATIONS:   Allergies as of 12/19/2017      Reactions   Ace  Inhibitors Nausea Only   Farxiga [dapagliflozin]    Vaginitis      Medication List    STOP taking these medications   cephALEXin 500 MG capsule Commonly known as:  KEFLEX   ibuprofen 800 MG tablet Commonly known as:  ADVIL,MOTRIN     TAKE these medications   amLODipine 5 MG tablet Commonly known as:  NORVASC Take 1 tablet (5 mg total) by mouth daily.   ampicillin-sulbactam  IVPB Commonly known as:  UNASYN Inject 3 g into the vein every 6 (six) hours for 27 days. Indication:  Osteomyelitis of finger Last Day of Therapy:  01/15/2018 Labs weekly on Thursday  while on IV antibiotics: _X_ CBC with differential _X__ CMP   X_   ESR   atorvastatin 10 MG tablet Commonly known as:  LIPITOR Take 1 tablet (10 mg total) by mouth daily.   glimepiride 2 MG tablet Commonly known as:  AMARYL TAKE ONE TABLET BY MOUTH ONCE DAILY BEFORE  BREAKFAST   meloxicam 15 MG tablet Commonly known as:  MOBIC Take 1 tablet (15 mg total) by mouth daily.   metFORMIN 500 MG tablet Commonly known as:  GLUCOPHAGE Take 500 mg by mouth 2 (two) times daily with a meal.   methimazole 10 MG tablet Commonly known as:  TAPAZOLE Take 1 tablet (10 mg total) by mouth daily.   metoprolol tartrate 25 MG tablet Commonly known as:  LOPRESSOR Take 1 tablet (25 mg total) by mouth 2 (two) times daily.   oxyCODONE 5 MG immediate release tablet Commonly known as:  Oxy IR/ROXICODONE Take 1 tablet (5 mg total) by mouth every 6 (six) hours as needed for moderate pain or severe pain (pain score 4-6).            Home Infusion Instuctions  (From admission, onward)         Start     Ordered   12/19/17 0000  Home infusion instructions [facility] May follow [facility] Dosing Protocol; May administer Cathflo as needed to maintain patency of vascular access device.; Flushing of vascular access device: per [facility] Protocol: 0.9% NaCl pre/post medica...    Question Answer Comment  Instructions May follow [facility] Dosing Protocol   Instructions May administer Cathflo as needed to maintain patency of vascular access device.   Instructions Flushing of vascular access device: per [facility] Protocol: 0.9% NaCl pre/post medication administration and prn patency; Heparin 100 u/ml, 61m for implanted ports and Heparin 10u/ml, 589mfor all other central venous catheters.   Instructions May follow [facility] Anaphylaxis Protocol for First Dose Administration in the home: 0.9% NaCl at 25-50 ml/hr to maintain IV access for protocol meds. Epinephrine 0.3 ml IV/IM PRN and Benadryl 25-50 IV/IM PRN s/s of anaphylaxis.   Instructions [facility] Infusion Coordinator (RN) to assist per patient IV care needs in the home PRN.      12/19/17 1434           DISCHARGE INSTRUCTIONS:   1. PCP f/u in 1-2 weeks 2. ID f/u in 1 week 3. Orthopedics f/u in 2 weeks  DIET:   Cardiac diet and Diabetic diet  ACTIVITY:   Activity as tolerated  OXYGEN:   Home Oxygen: No.  Oxygen Delivery: room air  DISCHARGE LOCATION:   home   If you experience worsening of your admission symptoms, develop shortness of breath, life threatening emergency, suicidal or homicidal thoughts you must seek medical attention immediately by calling 911 or calling your MD immediately  if symptoms less severe.  You Must read complete instructions/literature along with all the possible adverse reactions/side effects for all the Medicines you take and that have been prescribed to you. Take any new Medicines after you have completely understood and accpet all the possible adverse reactions/side effects.   Please note  You were cared for by a hospitalist during your hospital stay. If you have any questions about your discharge medications or the care you received while you were in the hospital after you are discharged, you can call the unit and asked to speak with the hospitalist on call if the hospitalist that took care of you is not available. Once  you are discharged, your primary care physician will handle any further medical issues. Please note that NO REFILLS for any discharge medications will be authorized once you are discharged, as it is imperative that you return to your primary care physician (or establish a relationship with a primary care physician if you do not have one) for your aftercare needs so that they can reassess your need for medications and monitor your lab values.    On the day of Discharge:  VITAL SIGNS:   Blood pressure (!) 161/92, pulse 72, temperature 98 F (36.7 C), temperature source Oral, resp. rate 18, height 5' 2"  (1.575 m), weight 85.5 kg, SpO2 100 %.  PHYSICAL EXAMINATION:    GENERAL:  [age].o.-year-old obese patient lying in the bed with no acute distress.  EYES: Pupils equal, round, reactive to light and accommodation. No scleral icterus. Extraocular muscles intact.  HEENT: Head atraumatic, normocephalic. Oropharynx and nasopharynx clear.  NECK:  Supple, no jugular venous distention. No thyroid enlargement, no tenderness.  LUNGS: Normal breath sounds bilaterally, no wheezing, rales,rhonchi or crepitation. No use of accessory muscles of respiration.  CARDIOVASCULAR: S1, S2 normal. No murmurs, rubs, or gallops.  ABDOMEN: Soft, nontender, nondistended. Bowel sounds present. No organomegaly or mass.  EXTREMITIES: No pedal edema, cyanosis, or clubbing.  Left hand middle finger dressing in place. NEUROLOGIC: Cranial nerves II through XII are intact. Muscle strength 5/5 in all extremities. Sensation intact. Gait not checked.  PSYCHIATRIC: The patient is alert and oriented x 3.  SKIN: No obvious rash, lesion, or ulcer.   DATA REVIEW:   CBC Recent Labs  Lab 12/19/17 0559  WBC 9.8  HGB 11.9*  HCT 35.0  PLT 172    Chemistries  Recent Labs  Lab 12/17/17 0454  NA 143  K 3.9  CL 111  CO2 23  GLUCOSE 138*  BUN 21*  CREATININE 0.99  CALCIUM 9.0     Microbiology Results  Results for orders  placed or performed during the hospital encounter of 12/14/17  Blood culture (single)     Status: None   Collection Time: 12/14/17  8:46 PM  Result Value Ref Range Status   Specimen Description BLOOD RIGHT ANTECUBITAL  Final   Special Requests   Final    BOTTLES DRAWN AEROBIC AND ANAEROBIC Blood Culture adequate volume   Culture   Final    NO GROWTH 5 DAYS Performed at [facility], [address] [place], [place] [address]    Report Status 12/19/2017 FINAL  Final  Aerobic Culture (superficial specimen)     Status: None   Collection Time: 12/15/17 12:19 PM  Result Value Ref Range Status   Specimen Description   Final    WOUND Performed at [facility], [address], [place], [place] [address]    Special Requests   Final    NONE Performed at [facility], [address] [place], [place] [address]    Gram Stain   Final    RARE WBC PRESENT, PREDOMINANTLY MONONUCLEAR RARE GRAM POSITIVE COCCI RARE GRAM NEGATIVE RODS    Culture   Final    RARE EIKENELLA CORRODENS Usually susceptible to penicillin and other beta lactam agents,quinolones,macrolides and tetracyclines. RARE NORMAL SKIN FLORA Performed at [facility], [place] [address] [place], [place] [address]    Report Status 12/18/2017 FINAL  Final  MRSA PCR Screening     Status: None   Collection Time: 12/15/17  5:28 PM  Result Value Ref Range Status   MRSA by PCR NEGATIVE NEGATIVE Final    Comment:        The GeneXpert MRSA Assay (FDA approved for NASAL specimens only), is one component of a comprehensive MRSA colonization surveillance program. It is not intended to diagnose MRSA infection nor to guide or monitor treatment for MRSA infections. Performed at [facility], [address] [place], [place] [address]   Fungus Culture With Stain     Status: None (Preliminary result)   Collection Time: 12/15/17  9:26 PM  Result Value Ref Range Status   Fungus Stain Final  report  Final    Comment: (NOTE) Performed At: [facility] [place], [place] [id number] Rush Farmer MD [id number]    Fungus (Mycology) Culture PENDING  Incomplete   Fungal Source BONE  Final    Comment: FINGER LEFT LONG Performed at [facility], [address], , [place] [address]   Fungus Culture With Stain     Status: None (Preliminary result)   Collection Time: 12/15/17  9:26 PM  Result Value Ref Range Status   Fungus Stain Final report  Final    Comment: (NOTE) Performed At: [facility] [place], [place] [id number] Rush Farmer MD [id number]    Fungus (Mycology) Culture PENDING  Incomplete   Fungal Source TISSUE  Final    Comment: FINGER LEFT LONG Performed at [facility], [address] [place], [place] [address]   Aerobic/Anaerobic Culture (surgical/deep wound)     Status: None   Collection Time: 12/15/17  9:26 PM  Result Value Ref Range Status   Specimen Description   Final    BONE FINGER LEFT LONG Performed at [facility], [address] [place], [place] [address]    Special Requests   Final    NONE Performed at [facility], [address] [place], [place] [address]    Gram Stain   Final    RARE WBC PRESENT, PREDOMINANTLY MONONUCLEAR NO ORGANISMS SEEN Performed at [place] [address], [place], [place] [address]    Culture   Final    RARE BACTEROIDES FRAGILIS BETA LACTAMASE POSITIVE RARE PEPTOSTREPTOCOCCUS ASACCHAROLYTICUS RARE FINEGOLDIA MAGNA    Report Status 12/19/2017 FINAL  Final  Aerobic/Anaerobic Culture (surgical/deep wound)     Status: None   Collection Time: 12/15/17  9:26 PM  Result Value Ref Range Status   Specimen Description TISSUE FINGER LEFT LONG  Final   Special Requests NONE  Final   Gram Stain NO WBC SEEN NO ORGANISMS SEEN   Final   Culture   Final    RARE BACTEROIDES FRAGILIS BETA LACTAMASE POSITIVE RARE  FINEGOLDIA MAGNA RARE PEPTONIPHILUS ASACCHAROLYTICUS    Report Status 12/19/2017 FINAL  Final  Acid Fast Smear (AFB)     Status: None   Collection Time: 12/15/17  9:26 PM  Result Value Ref Range Status   AFB Specimen Processing Comment  Final    Comment: Tissue Grinding and Digestion/Decontamination   Acid Fast Smear Negative  Final    Comment: (NOTE) Performed At: [facility] [place], [place] [id number] Rush Farmer MD [id number]    Source (AFB) BONE  Final    Comment: FINGER LEFT LONG Performed at [facility], [address] [place], [place] [address]   Acid Fast Smear (AFB)     Status: None   Collection Time: 12/15/17  9:26 PM  Result Value Ref Range Status   AFB Specimen Processing Comment  Final    Comment: Tissue Grinding and Digestion/Decontamination   Acid Fast Smear Negative  Final    Comment: (NOTE) Performed At: [facility] [place], [place] [id number] Rush Farmer MD [id number]    Source (AFB) TISSUE  Final    Comment: FINGER LEFT LONG Performed at [facility], [address] [place], [place] [address]   Fungus Culture Result     Status: None   Collection Time: 12/15/17  9:26 PM  Result Value Ref Range Status   Result 1 Comment  Final    Comment: (NOTE) KOH/Calcofluor preparation:  no fungus observed. Performed At: [facility] [place], [place] [id number] Rush Farmer MD [id number]   Fungus Culture Result     Status: None   Collection Time: 12/15/17  9:26 PM  Result Value Ref Range  Status   Result 1 Comment  Final    Comment: (NOTE) KOH/Calcofluor preparation:  no fungus observed. Performed At: [facility] [place], [place] [id number] Rush Farmer MD [id number]     RADIOLOGY:  No results found.   Management plans discussed with the patient, family and they are in agreement.  CODE STATUS:  Code Status  History    Date Active Date Inactive Code Status Order ID Comments User Context   12/14/2017 2257 12/19/2017 2138 Full Code [id number]  Mayo, Pete Pelt, MD Inpatient      TOTAL TIME TAKING CARE OF THIS PATIENT: 38  minutes.    Gladstone Lighter M.D on 12/22/2017 at 4:21 PM  Between 7am to 6pm - Pager - [phone]  After 6pm go to [email or web address] - Proofreader  [facility]  Office  [phone]  CC: Primary care physician; Tawni Millers, MD   Note: This dictation was prepared with Dragon dictation along with smaller phrase technology. Any transcriptional errors that result from this process are unintentional.

## 2017-12-25 ENCOUNTER — Encounter: Payer: Self-pay | Admitting: Infectious Diseases

## 2017-12-25 ENCOUNTER — Ambulatory Visit: Payer: Medicaid Other | Attending: Infectious Diseases | Admitting: Infectious Diseases

## 2017-12-25 VITALS — BP 164/96 | HR 128 | Temp 99.0°F

## 2017-12-25 DIAGNOSIS — F17210 Nicotine dependence, cigarettes, uncomplicated: Secondary | ICD-10-CM | POA: Diagnosis not present

## 2017-12-25 DIAGNOSIS — Z79899 Other long term (current) drug therapy: Secondary | ICD-10-CM | POA: Diagnosis not present

## 2017-12-25 DIAGNOSIS — I10 Essential (primary) hypertension: Secondary | ICD-10-CM | POA: Diagnosis not present

## 2017-12-25 DIAGNOSIS — E039 Hypothyroidism, unspecified: Secondary | ICD-10-CM | POA: Diagnosis not present

## 2017-12-25 DIAGNOSIS — M86142 Other acute osteomyelitis, left hand: Secondary | ICD-10-CM | POA: Insufficient documentation

## 2017-12-25 DIAGNOSIS — Z833 Family history of diabetes mellitus: Secondary | ICD-10-CM | POA: Insufficient documentation

## 2017-12-25 DIAGNOSIS — Z791 Long term (current) use of non-steroidal anti-inflammatories (NSAID): Secondary | ICD-10-CM | POA: Diagnosis not present

## 2017-12-25 DIAGNOSIS — Z7984 Long term (current) use of oral hypoglycemic drugs: Secondary | ICD-10-CM | POA: Diagnosis not present

## 2017-12-25 DIAGNOSIS — E119 Type 2 diabetes mellitus without complications: Secondary | ICD-10-CM | POA: Diagnosis not present

## 2017-12-25 NOTE — Progress Notes (Signed)
NAME: Sarah [name]  DOB: 1955-10-25  MRN: [id number]  Date/Time: 12/25/2017 12:19 PM Subjective:  Follow up visit ? Sarah [name] is a 62 y.o. with a history of Dm, hyperthyroidism was iN [facility] between 9/19 and 12/19/17 for an infected left middle finger which she sustained by oulling a hangnail and culutre was eikenella. She had underlying osteomyelits of the Distal phalanx and she uderwent amputation of the distal phalanx. Biopsy showed osteo and the proximal margin was not clear of infection. She was sent home on IV unasyn 3 grams Q6.  She is doing well and has no fever, chills or rash or diarrhea. Her friend, husband gives her IV and also changes the dressing. She has been taking Po augmentin as well ( this was given to her for 1 day only  to bridge until she got Iv unasyn at home).  Past Medical History:  Diagnosis Date  . Diabetes mellitus without complication ([place])    Hyperthyroidism  Past Surgical History:  Procedure Laterality Date  . ABDOMINAL HYSTERECTOMY    . AMPUTATION Left 12/15/2017   Procedure: AMPUTATION DIGIT left long finger;  Surgeon: Dereck Leep, MD;  Location: [facility] ORS;  Service: Orthopedics;  Laterality: Left;  . PARTIAL HYSTERECTOMY      Social History   Socioeconomic History  . Marital status: Single    Spouse name: Not on file  . Number of children: Not on file  . Years of education: Not on file  . Highest education level: 12th grade  Occupational History  . Occupation: unemployed  Social Needs  . Financial resource strain: Not hard at all  . Food insecurity:    Worry: Never true    Inability: Never true  . Transportation needs:    Medical: No    Non-medical: No  Tobacco Use  . Smoking status: Current Some Day Smoker    Types: Cigarettes  . Smokeless tobacco: Never Used  Substance and Sexual Activity  . Alcohol use: No    Alcohol/week: 0.0 standard drinks  . Drug use: No  . Sexual activity: Not on file  Lifestyle  . Physical  activity:    Days per week: 4 days    Minutes per session: 10 min  . Stress: Not at all  Relationships  . Social connections:    Talks on phone: More than three times a week    Gets together: More than three times a week    Attends religious service: More than 4 times per year    Active member of club or organization: No    Attends meetings of clubs or organizations: Never    Relationship status: Not on file  . Intimate partner violence:    Fear of current or ex partner: No    Emotionally abused: No    Physically abused: No    Forced sexual activity: No  Other Topics Concern  . Not on file  Social History Narrative   - pt not interested in participating    No consent form signed      Pt is on food stamps, her boyfriend is too who helps her out with finances    Family History  Problem Relation Age of Onset  . Diabetes Mother   . Diabetes Sister   . Breast cancer Neg Hx    Allergies  Allergen Reactions  . Ace Inhibitors Nausea Only  . [place] Glade [Dapagliflozin]     Vaginitis    ? Current Outpatient Medications  Medication Sig  Dispense Refill  . amLODipine (NORVASC) 5 MG tablet Take 1 tablet (5 mg total) by mouth daily. 30 tablet 11  . atorvastatin (LIPITOR) 10 MG tablet Take 1 tablet (10 mg total) by mouth daily. 90 tablet 3  . glimepiride (AMARYL) 2 MG tablet TAKE ONE TABLET BY MOUTH ONCE DAILY BEFORE  BREAKFAST 90 tablet 3  . meloxicam (MOBIC) 15 MG tablet Take 1 tablet (15 mg total) by mouth daily. 90 tablet 3  . metFORMIN (GLUCOPHAGE) 500 MG tablet Take 500 mg by mouth 2 (two) times daily with a meal.    . methimazole (TAPAZOLE) 10 MG tablet Take 1 tablet (10 mg total) by mouth daily. 90 tablet 3  . metoprolol tartrate (LOPRESSOR) 25 MG tablet Take 1 tablet (25 mg total) by mouth 2 (two) times daily. 60 tablet 12  . oxyCODONE (OXY IR/ROXICODONE) 5 MG immediate release tablet Take 1 tablet (5 mg total) by mouth every 6 (six) hours as needed for moderate pain or severe  pain (pain score 4-6). 20 tablet 0   No current facility-administered medications for this visit.     REVIEW OF SYSTEMS:  Const: negative fever, negative chills, negative weight loss Eyes: negative diplopia or visual changes, negative eye pain ENT: negative coryza, negative sore throat Resp: negative cough, hemoptysis, dyspnea Cards: negative for chest pain, palpitations, lower extremity edema GU: negative for frequency, dysuria and hematuria GI: Negative for abdominal pain, diarrhea, bleeding, constipation Skin: negative for rash and pruritus Heme: negative for easy bruising and gum/nose bleeding MS: negative for myalgias, arthralgias, back pain and muscle weakness Neurolo:negative for headaches, dizziness, vertigo, memory problems  Psych: negative for feelings of anxiety, depression  Endocrine: no polyuria or polydipsia Allergy/Immunology- negative for any medication or food allergies  Objective:  VITALS:  BP (!) 164/96 (BP Location: Left Arm, Patient Position: Sitting, Cuff Size: Normal)   Pulse (!) 128   Temp 99 F (37.2 C) (Oral)  PHYSICAL EXAM:  General: Alert, cooperative, no distress, appears stated age.  Head: Normocephalic, without obvious abnormality, atraumatic. Eyes: Conjunctivae clear, anicteric sclerae. Pupils are equal ENT Nares normal. No drainage or sinus tenderness. Lips, mucosa, and tongue normal. No Thrush Neck: Supple, symmetrical, no adenopathy, thyroid: non tender no carotid bruit and no JVD. Back: No CVA tenderness. Lungs: Clear to auscultation bilaterally. No Wheezing or Rhonchi. No rales. Heart: Regular rate and rhythm, no murmur, rub or gallop. Abdomen: Soft, non-tender,not distended. Bowel sounds normal. No masses Extremities: left middle finger- surgical site looks clean with no edema , some bony swellling Skin: No rashes or lesions. Or bruising Lymph: Cervical, supraclavicular normal. Neurologic: Grossly non-focal Pertinent Labs None  available ? Impression/Recommendation   Infection of the left middle finger with osteomyelitis. s/p amputation of the Distal phalanx Proximal margin positive for osteo  on IV unasyn 3 grams Q 6 for 4 weeks ( may be able to switch to PO earlier after she sees Dr.Hooten)  Diabetes mellitus management as per primary team  Hyperthyroidism on methimazole  Hypertension:On metoprolol and hydrochlorothiazide  On atorvastatin 10 mg. ? ?Follow up in 2 weeks? ___________________________________________________ Discussed with patient in detail

## 2017-12-25 NOTE — Patient Instructions (Addendum)
You are here to follow up on the infection of your middle finger bone- You should be only taking Intravenous unasyn every 6 hours- 6am, 12 noon, 6 pm and 12 midnight. You will take IV antibiotics for another 3 weeks. Do not  take any oral pill ( amox/clav) antibiotic now. I will let you know when to start taking the pill. Will see you in 2 weeks

## 2017-12-26 ENCOUNTER — Ambulatory Visit: Payer: Medicaid Other | Admitting: Adult Health Nurse Practitioner

## 2017-12-26 VITALS — BP 146/92 | HR 99 | Temp 98.1°F | Ht 60.0 in | Wt 193.1 lb

## 2017-12-26 DIAGNOSIS — M869 Osteomyelitis, unspecified: Secondary | ICD-10-CM

## 2017-12-26 NOTE — Progress Notes (Signed)
  Patient: Sarah [name] Female    DOB: 09/29/1955   62 y.o.   MRN: [id number] Visit Date: 12/26/2017  Today's Provider: Staci Acosta, NP   Chief Complaint  Patient presents with  . Follow-up   Subjective:    HPI   Last visit presented with a finger wound-Seen in the ED and treated for osteomyelitis, amputation of distal phalanx on 9.20.19. D/c home with 4 weeks of IV Unasyn. Has a PICC line.  States she is tolerating antibiotics well. Has appointment on Friday with [facility] clinic for wound management.   Allergies  Allergen Reactions  . Ace Inhibitors Nausea Only  . Farxiga [Dapagliflozin]     Vaginitis    Previous Medications   AMLODIPINE (NORVASC) 5 MG TABLET    Take 1 tablet (5 mg total) by mouth daily.   AMPICILLIN-SULBACTAM (UNASYN) IVPB    Inject 3 g into the vein every 6 (six) hours for 27 days. Indication:  Osteomyelitis of finger Last Day of Therapy:  01/15/2018 Labs weekly on Thursday  while on IV antibiotics: _X_ CBC with differential _X__ CMP   X_   ESR    ATORVASTATIN (LIPITOR) 10 MG TABLET    Take 1 tablet (10 mg total) by mouth daily.   GLIMEPIRIDE (AMARYL) 2 MG TABLET    TAKE ONE TABLET BY MOUTH ONCE DAILY BEFORE  BREAKFAST   MELOXICAM (MOBIC) 15 MG TABLET    Take 1 tablet (15 mg total) by mouth daily.   METFORMIN (GLUCOPHAGE) 500 MG TABLET    Take 500 mg by mouth 2 (two) times daily with a meal.   METHIMAZOLE (TAPAZOLE) 10 MG TABLET    Take 1 tablet (10 mg total) by mouth daily.   METOPROLOL TARTRATE (LOPRESSOR) 25 MG TABLET    Take 1 tablet (25 mg total) by mouth 2 (two) times daily.   OXYCODONE (OXY IR/ROXICODONE) 5 MG IMMEDIATE RELEASE TABLET    Take 1 tablet (5 mg total) by mouth every 6 (six) hours as needed for moderate pain or severe pain (pain score 4-6).    Review of Systems  Social History   Tobacco Use  . Smoking status: Current Some Day Smoker    Types: Cigarettes  . Smokeless tobacco: Never Used  Substance Use Topics  .  Alcohol use: No    Alcohol/week: 0.0 standard drinks   Objective:   BP (!) 146/92   Pulse 99   Temp 98.1 F (36.7 C)   Ht 5' (1.524 m)   Wt 193 lb 1.6 oz (87.6 kg)   BMI 37.71 kg/m   Physical Exam  Constitutional: She is oriented to person, place, and time. She appears well-developed and well-nourished.  Pulmonary/Chest: Effort normal.  Musculoskeletal:       Hands: Neurological: She is alert and oriented to person, place, and time.        Assessment & Plan:         Fu with Dr. Mable Fill at the end of the month. Routine labs at that time.   FU with Outpatient management at [facility] as indicated. Monitor for wound healing.    Staci Acosta, NP   [facility]

## 2017-12-28 ENCOUNTER — Ambulatory Visit: Payer: Medicaid Other | Admitting: Ophthalmology

## 2017-12-28 ENCOUNTER — Other Ambulatory Visit: Payer: Self-pay | Admitting: Ophthalmology

## 2017-12-28 LAB — HM DIABETES EYE EXAM

## 2018-01-02 ENCOUNTER — Ambulatory Visit: Payer: Medicaid Other | Admitting: Family Medicine

## 2018-01-02 VITALS — BP 147/89 | HR 93 | Temp 97.5°F | Ht 61.0 in | Wt 191.8 lb

## 2018-01-02 DIAGNOSIS — S68119D Complete traumatic metacarpophalangeal amputation of unspecified finger, subsequent encounter: Secondary | ICD-10-CM

## 2018-01-02 DIAGNOSIS — Z09 Encounter for follow-up examination after completed treatment for conditions other than malignant neoplasm: Secondary | ICD-10-CM

## 2018-01-02 DIAGNOSIS — G8929 Other chronic pain: Secondary | ICD-10-CM

## 2018-01-02 DIAGNOSIS — E119 Type 2 diabetes mellitus without complications: Secondary | ICD-10-CM

## 2018-01-02 DIAGNOSIS — M545 Low back pain: Secondary | ICD-10-CM

## 2018-01-02 DIAGNOSIS — S68129D Partial traumatic metacarpophalangeal amputation of unspecified finger, subsequent encounter: Secondary | ICD-10-CM

## 2018-01-02 DIAGNOSIS — M869 Osteomyelitis, unspecified: Secondary | ICD-10-CM

## 2018-01-02 DIAGNOSIS — I10 Essential (primary) hypertension: Secondary | ICD-10-CM

## 2018-01-02 NOTE — Progress Notes (Signed)
Patient: Sarah [name] Female    DOB: 08-28-1955   62 y.o.   MRN: [id number] Visit Date: 01/07/2018  Today's Provider: Azzie Glatter, FNP   Chief Complaint  Patient presents with  . Follow-up   Subjective:    HPI  Sarah [name] is a 62 year old female with a past medical history of Diabetes. She is here today for follow up.   Current Status: Since her last office visit, she is doing well with no complaints. She has had a left middle finger tip amputation 2 weeks ago on 12/15/2017, because of osteomyelitis. Her pain and discomfort has subsided in left middle finger since procedure. She denies fevers, chills, fatigue, recent infections, weight loss, and night sweats. She has not had any headaches, visual changes, dizziness, and falls. No chest pain, heart palpitations, cough and shortness of breath reported. No reports of GI problems such as nausea, vomiting, diarrhea, and constipation. She has no reports of blood in stools, dysuria and hematuria. No depression or anxiety, and denies suicidal ideations, homicidal ideations, or auditory hallucinations. She denies pain today.   Past Medical History:  Diagnosis Date  . Diabetes mellitus without complication ([place])     Family History  Problem Relation Age of Onset  . Diabetes Mother   . Diabetes Sister   . Breast cancer Neg Hx     Social History   Socioeconomic History  . Marital status: Single    Spouse name: Not on file  . Number of children: Not on file  . Years of education: Not on file  . Highest education level: 12th grade  Occupational History  . Occupation: unemployed  Social Needs  . Financial resource strain: Not hard at all  . Food insecurity:    Worry: Never true    Inability: Never true  . Transportation needs:    Medical: No    Non-medical: No  Tobacco Use  . Smoking status: Current Some Day Smoker    Types: Cigarettes  . Smokeless tobacco: Never Used  Substance and Sexual Activity  . Alcohol use: No      Alcohol/week: 0.0 standard drinks  . Drug use: No  . Sexual activity: Not on file  Lifestyle  . Physical activity:    Days per week: 4 days    Minutes per session: 10 min  . Stress: Not at all  Relationships  . Social connections:    Talks on phone: More than three times a week    Gets together: More than three times a week    Attends religious service: More than 4 times per year    Active member of club or organization: No    Attends meetings of clubs or organizations: Never    Relationship status: Not on file  . Intimate partner violence:    Fear of current or ex partner: No    Emotionally abused: No    Physically abused: No    Forced sexual activity: No  Other Topics Concern  . Not on file  Social History Narrative   - pt not interested in participating    No consent form signed      Pt is on food stamps, her boyfriend is too who helps her out with finances    Past Surgical History:  Procedure Laterality Date  . ABDOMINAL HYSTERECTOMY    . AMPUTATION Left 12/15/2017   Procedure: AMPUTATION DIGIT left long finger;  Surgeon: Dereck Leep, MD;  Location: [facility] ORS;  Service: Orthopedics;  Laterality: Left;  . PARTIAL HYSTERECTOMY       Allergies  Allergen Reactions  . Ace Inhibitors Nausea Only  . Farxiga [Dapagliflozin]     Vaginitis    Previous Medications   AMLODIPINE (NORVASC) 5 MG TABLET    Take 1 tablet (5 mg total) by mouth daily.   AMPICILLIN-SULBACTAM (UNASYN) IVPB    Inject 3 g into the vein every 6 (six) hours for 27 days. Indication:  Osteomyelitis of finger Last Day of Therapy:  01/15/2018 Labs weekly on Thursday  while on IV antibiotics: _X_ CBC with differential _X__ CMP   X_   ESR    ATORVASTATIN (LIPITOR) 10 MG TABLET    Take 1 tablet (10 mg total) by mouth daily.   GLIMEPIRIDE (AMARYL) 2 MG TABLET    TAKE ONE TABLET BY MOUTH ONCE DAILY BEFORE  BREAKFAST   MELOXICAM (MOBIC) 15 MG TABLET    Take 1 tablet (15 mg total) by mouth daily.    METFORMIN (GLUCOPHAGE) 500 MG TABLET    Take 500 mg by mouth 2 (two) times daily with a meal.   METHIMAZOLE (TAPAZOLE) 10 MG TABLET    Take 1 tablet (10 mg total) by mouth daily.   METOPROLOL TARTRATE (LOPRESSOR) 25 MG TABLET    Take 1 tablet (25 mg total) by mouth 2 (two) times daily.   OXYCODONE (OXY IR/ROXICODONE) 5 MG IMMEDIATE RELEASE TABLET    Take 1 tablet (5 mg total) by mouth every 6 (six) hours as needed for moderate pain or severe pain (pain score 4-6).   Review of Systems  Constitutional: Negative.   HENT: Negative.   Respiratory: Negative.   Cardiovascular: Negative.   Gastrointestinal: Negative.   Genitourinary: Negative.   Musculoskeletal: Negative.   Skin: Negative.   Neurological: Negative.   Psychiatric/Behavioral: Negative.    Social History   Tobacco Use  . Smoking status: Current Some Day Smoker    Types: Cigarettes  . Smokeless tobacco: Never Used  Substance Use Topics  . Alcohol use: No    Alcohol/week: 0.0 standard drinks   Objective:   BP (!) 147/89 (BP Location: Left Arm, Patient Position: Sitting, Cuff Size: Large)   Pulse 93   Temp (!) 97.5 F (36.4 C)   Ht 5' 1"  (1.549 m)   Wt 191 lb 12.8 oz (87 kg)   BMI 36.24 kg/m   Physical Exam  Constitutional: She is oriented to person, place, and time. She appears well-developed and well-nourished.  Neck: Normal range of motion. Neck supple.  Cardiovascular: Normal rate, regular rhythm, normal heart sounds and intact distal pulses.  Pulmonary/Chest: Effort normal and breath sounds normal.  Abdominal: Soft. Bowel sounds are normal.  Musculoskeletal: Normal range of motion.       Arms: Neurological: She is alert and oriented to person, place, and time.  Skin: Skin is warm and dry.  Psychiatric: She has a normal mood and affect. Her behavior is normal. Judgment and thought content normal.  Nursing note and vitals reviewed.  Assessment & Plan:   1. Osteomyelitis of finger of left hand ([place])  2.  Amputation of tip of finger, subsequent encounter She is s/p: left middle finger tip amputation on 12/15/2017, r/t osteomyelitis.. Stable. No complaints. Monitor.   3. Diabetes mellitus without complication (HCC) Hgb L5Q improved at 7.8 on 12/16/2017, from 8.2 on 10/25/2017. She will continue to decrease foods/beverages high in sugars and carbs and follow Heart Healthy or DASH diet. Increase physical activity to at least  30 minutes cardio exercise daily.   4. Essential (primary) hypertension Blood pressure is stable today. Continue Amlodipine as prescribed. She will continue to decrease high sodium intake, excessive alcohol intake, increase potassium intake, smoking cessation, and increase physical activity of at least 30 minutes of cardio activity daily. She will continue to follow Heart Healthy or DASH diet.  5. Chronic bilateral low back pain without sciatica Continue Meloxicam as prescribed.   6. Follow up She will follow up in 3 months.  No orders of the defined types were placed in this encounter.  Kathe Becton,  MSN, FNP-C [facility] [address] [place],  [id number] [phone]

## 2018-01-09 ENCOUNTER — Other Ambulatory Visit: Payer: Self-pay

## 2018-01-11 ENCOUNTER — Ambulatory Visit: Payer: Self-pay | Admitting: Infectious Diseases

## 2018-01-16 LAB — FUNGUS CULTURE WITH STAIN

## 2018-01-16 LAB — FUNGUS CULTURE RESULT

## 2018-01-16 LAB — FUNGAL ORGANISM REFLEX

## 2018-01-17 ENCOUNTER — Other Ambulatory Visit: Payer: Medicaid Other

## 2018-01-17 DIAGNOSIS — E0500 Thyrotoxicosis with diffuse goiter without thyrotoxic crisis or storm: Secondary | ICD-10-CM

## 2018-01-17 DIAGNOSIS — IMO0001 Reserved for inherently not codable concepts without codable children: Secondary | ICD-10-CM

## 2018-01-17 DIAGNOSIS — E1165 Type 2 diabetes mellitus with hyperglycemia: Principal | ICD-10-CM

## 2018-01-18 LAB — CBC
Hematocrit: 34.1 % (ref 34.0–46.6)
Hemoglobin: 11.7 g/dL (ref 11.1–15.9)
MCH: 29.1 pg (ref 26.6–33.0)
MCHC: 34.3 g/dL (ref 31.5–35.7)
MCV: 85 fL (ref 79–97)
PLATELETS: 190 10*3/uL (ref 150–450)
RBC: 4.02 x10E6/uL (ref 3.77–5.28)
RDW: 13 % (ref 12.3–15.4)
WBC: 9 10*3/uL (ref 3.4–10.8)

## 2018-01-18 LAB — COMPREHENSIVE METABOLIC PANEL
A/G RATIO: 1.6 (ref 1.2–2.2)
ALK PHOS: 100 IU/L (ref 39–117)
ALT: 23 IU/L (ref 0–32)
AST: 20 IU/L (ref 0–40)
Albumin: 4.4 g/dL (ref 3.6–4.8)
BUN/Creatinine Ratio: 21 (ref 12–28)
BUN: 15 mg/dL (ref 8–27)
Bilirubin Total: 0.4 mg/dL (ref 0.0–1.2)
CO2: 20 mmol/L (ref 20–29)
Calcium: 9.6 mg/dL (ref 8.7–10.3)
Chloride: 106 mmol/L (ref 96–106)
Creatinine, Ser: 0.73 mg/dL (ref 0.57–1.00)
GFR calc Af Amer: 104 mL/min/{1.73_m2} (ref >59)
GFR calc non Af Amer: 90 mL/min/{1.73_m2} (ref >59)
GLOBULIN, TOTAL: 2.8 g/dL (ref 1.5–4.5)
Glucose: 200 mg/dL — ABNORMAL HIGH (ref 65–99)
POTASSIUM: 3.9 mmol/L (ref 3.5–5.2)
SODIUM: 142 mmol/L (ref 134–144)
Total Protein: 7.2 g/dL (ref 6.0–8.5)

## 2018-01-18 LAB — TSH: TSH: 0.05 u[IU]/mL — ABNORMAL LOW (ref 0.450–4.500)

## 2018-01-18 LAB — HEMOGLOBIN A1C
ESTIMATED AVERAGE GLUCOSE: 169 mg/dL
HEMOGLOBIN A1C: 7.5 % — AB (ref 4.8–5.6)

## 2018-01-24 ENCOUNTER — Encounter: Payer: Self-pay | Admitting: Internal Medicine

## 2018-01-24 ENCOUNTER — Ambulatory Visit: Payer: Medicaid Other | Admitting: Internal Medicine

## 2018-01-24 VITALS — BP 159/84 | HR 99 | Temp 98.1°F | Ht 61.5 in | Wt 188.5 lb

## 2018-01-24 DIAGNOSIS — I10 Essential (primary) hypertension: Secondary | ICD-10-CM

## 2018-01-24 DIAGNOSIS — E119 Type 2 diabetes mellitus without complications: Secondary | ICD-10-CM

## 2018-01-24 DIAGNOSIS — Z0000 Encounter for general adult medical examination without abnormal findings: Secondary | ICD-10-CM

## 2018-01-24 DIAGNOSIS — E0500 Thyrotoxicosis with diffuse goiter without thyrotoxic crisis or storm: Secondary | ICD-10-CM

## 2018-01-24 DIAGNOSIS — Z01818 Encounter for other preprocedural examination: Secondary | ICD-10-CM

## 2018-01-24 NOTE — Progress Notes (Signed)
 Subjective:    Patient ID: Sarah [name], female    DOB: 03/29/1955, 61 y.o.   MRN: [id number]  HPI  Chief Complaint  Patient presents with  . Hip Pain  . Medical Clearance   Pt reports that she is candidate for Left Hip Surgery and needs clearance.   Pt reports that she will also need to have dental clearance. [facility] unable to provide referral to [facility] at this time.   Review of Systems   Patient Active Problem List   Diagnosis Date Noted  . Finger wound, simple, open, subsequent encounter 12/14/2017  . Felon of finger of left hand 12/14/2017  . 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 Primary osteoarthritis of left hip 02/28/2017  . Primary osteoarthritis of left knee 02/28/2017  . Hyperlipidemia associated with type 2 diabetes mellitus (HCC) 03/05/2015  . Biceps tendinitis 08/10/2014  . Essential (primary) hypertension 08/10/2014  . Hyperthyroidism 08/10/2014  . Uncontrolled type 2 diabetes mellitus without complication, without long-term current use of insulin (HCC) 08/10/2014     Allergies as of 01/24/2018      Reactions   Ace Inhibitors Nausea Only   Farxiga [dapagliflozin]    Vaginitis      Medication List        Accurate as of 01/24/18 11:13 AM. Always use your most recent med list.          amLODipine 5 MG tablet Commonly known as:  NORVASC Take 1 tablet (5 mg total) by mouth daily.   atorvastatin 10 MG tablet Commonly known as:  LIPITOR Take 1 tablet (10 mg total) by mouth daily.   glimepiride 2 MG tablet Commonly known as:  AMARYL TAKE ONE TABLET BY MOUTH ONCE DAILY BEFORE  BREAKFAST   meloxicam 15 MG tablet Commonly known as:  MOBIC Take 1 tablet (15 mg total) by mouth daily.   metFORMIN 500 MG tablet Commonly known as:  GLUCOPHAGE Take 500 mg by mouth 2 (two) times daily with a meal.   methimazole 10 MG tablet Commonly known as:  TAPAZOLE Take 1 tablet (10 mg total) by mouth daily.     metoprolol tartrate 25 MG tablet Commonly known as:  LOPRESSOR Take 1 tablet (25 mg total) by mouth 2 (two) times daily.   oxyCODONE 5 MG immediate release tablet Commonly known as:  Oxy IR/ROXICODONE Take 1 tablet (5 mg total) by mouth every 6 (six) hours as needed for moderate pain or severe pain (pain score 4-6).           Objective:   Physical Exam  Constitutional: She is oriented to person, place, and time.  Cardiovascular: Normal rate, regular rhythm and normal heart sounds.  Pulmonary/Chest: Effort normal and breath sounds normal.  Neurological: She is alert and oriented to person, place, and time.  Vitals reviewed. No lower edema of the lower extremities.  BP (!) 159/84   Pulse 99   Temp 98.1 F (36.7 C) (Oral)   Ht 5' 1.5" (1.562 m)   Wt 188 lb 8 oz (85.5 kg)   BMI 35.04 kg/m      Assessment & Plan:   1. Pre-operative clearance Patient has been cleared for Left Hip Surgery with moderate risk. Thyroid and diabetes medications appear to be therapeutic.  Pt still in need of dental clearance.   2. Goiter with hyperthyroidism Therapeutic   Check in 3 Months:  - TSH; Future  3. Essential (primary) hypertension Controlled.  Check in 3   Months: - Comprehensive metabolic panel; Future - CBC; Future  4. Healthcare maintenance Check in 3 Months:  - Hepatitis C Antibody; Future  5. Diabetes mellitus without complication (HCC) Last T0Z 7.5 (01/17/18)   Check in 3 Months: - Hemoglobin A1c; Future - Urinalysis; Future   Return in 3 months with labs a week prior (A1C, Met C, CBC, TSH, UA, Hep C Screening)

## 2018-01-30 LAB — ACID FAST CULTURE WITH REFLEXED SENSITIVITIES: ACID FAST CULTURE - AFSCU3: NEGATIVE

## 2018-01-30 LAB — ACID FAST CULTURE WITH REFLEXED SENSITIVITIES (MYCOBACTERIA): Acid Fast Culture: NEGATIVE

## 2018-02-13 ENCOUNTER — Ambulatory Visit: Payer: Medicaid Other

## 2018-02-13 VITALS — BP 149/81 | HR 82 | Temp 97.9°F | Ht 61.5 in | Wt 190.5 lb

## 2018-02-13 DIAGNOSIS — M1612 Unilateral primary osteoarthritis, left hip: Secondary | ICD-10-CM

## 2018-02-13 DIAGNOSIS — E119 Type 2 diabetes mellitus without complications: Secondary | ICD-10-CM

## 2018-02-13 LAB — GLUCOSE, POCT (MANUAL RESULT ENTRY): POC Glucose: 83 mg/dl (ref 70–99)

## 2018-02-13 MED ORDER — MELOXICAM 15 MG PO TABS: 15.0000 mg | ORAL_TABLET | Freq: Every day | ORAL | 3 refills | Status: DC

## 2018-02-13 NOTE — Patient Instructions (Signed)
Check blood sugar 1x a day. Check sometimes in the morning, sometimes before lunch, sometimes before dinner, sometimes at nighttime.   Take metformin with food. 1/2 a tablet in the morning with breakfast, 1/2 tablet in the evening with dinner.

## 2018-02-13 NOTE — Progress Notes (Unsigned)
Follow up Diabetes/ Endocrine [facility]     Patient ID: Sarah [name], female   DOB: 10-01-1955, 62 y.o.   MRN: [id number] Assessment:  Sarah [name] is a 62 y.o. female who is seen in follow up for Diabetes mellitus without complication ([place]) [G86.7] at the request of Sarah Millers, MD.  Encounter Diagnoses 1. Diabetes mellitus without complication ([place])     Plan:    1. Diabetes mellitus. Patient tolerating medications well. HbA1c in 12/2017 of 7.5. Patient does not report any symptoms of progressing diabetes or complications or any episodes of hypoglycemia.  - No changes to medication - Refill meloxicam  Patient Instructions  Check blood sugar 1x a day. Check sometimes in the morning, sometimes before lunch, sometimes before dinner, sometimes at nighttime.   Take metformin with food. 1/2 a tablet in the morning with breakfast, 1/2 tablet in the evening with dinner.     Orders Placed This Encounter  Procedures  . POCT Glucose (CBG)     Subjective:  Had for diabetes 20+ years. Metformin 250mg  BID. Glimiperide with breakfast 2mg  daily. Denies GI distress or upset stomach. Denies any muscle pains or aches. Checks blood glucose 1x a day, check in the morning before eating. Does not have a log and has difficulty remember. Sometimes will have morning sugars in the 200s, but haven't been high recently maybe around 120s. Have hip problems for approximately 1 year, tries to walk, but stopped by the pain. Diet includes: vegetables mostly, baked chicken, broccoli, ham, yogurt for snacks. [place] will have a little bit of fried food (not everyday). Drink 1 soda can (not diet - Pepsi) with skipping days in between each day. Does not drink the entire soda sometimes. Will drink orange juice - 1 cup in the morning. Will eat crackers, peanut butter, popcorn occasionally during the day. Will wake up 3-4x during the night to urinate, but depends on whether she drinks water prior to  bedtime; otherwise, does not wake up to urinate. Exposure to second hand smoke. Might smoke a cigarette once in a while.     Review of Systems  Constitutional: Negative for diaphoresis, fatigue and fever.  Respiratory: Negative for shortness of breath.   Cardiovascular: Negative for chest pain.  Endocrine: Negative for polydipsia and polyuria.  Musculoskeletal: Negative for arthralgias.  Neurological: Negative for dizziness, light-headedness and numbness.    Sarah [name]  has a past medical history of Diabetes mellitus without complication ([place]).  Family History, Social History, current Medications and allergies reviewed and updated in Epic.  Objective:    Blood pressure (!) 149/81, pulse 82, temperature 97.9 F (36.6 C), height 5' 1.5" (1.562 m), weight 190 lb 8 oz (86.4 kg). Physical Exam  Constitutional: She appears well-developed and well-nourished.  Eyes: Pupils are equal, round, and reactive to light. Conjunctivae and EOM are normal. Right eye exhibits no discharge. Left eye exhibits no discharge. No scleral icterus.  Cardiovascular: Normal rate, regular rhythm, normal heart sounds and intact distal pulses. Exam reveals no gallop and no friction rub.  No murmur heard. Pulmonary/Chest: Effort normal and breath sounds normal. She has no wheezes. She has no rales. She exhibits no tenderness.  Skin: Skin is warm and dry.        Data : I have personally reviewed pertinent labs and imaging studies, if indicated,  with the patient in clinic today.    Lab Orders     POCT Glucose (CBG)  HC Readings from Last  3 Encounters:  No data found for [facility]    Wt Readings from Last 3 Encounters:  02/13/18 190 lb 8 oz (86.4 kg)  01/24/18 188 lb 8 oz (85.5 kg)  01/02/18 191 lb 12.8 oz (87 kg)

## 2018-03-08 ENCOUNTER — Telehealth: Payer: Self-pay | Admitting: Adult Health Nurse Practitioner

## 2018-03-08 NOTE — Telephone Encounter (Signed)
Called about Dental Clinic referral. Patient needs to bring in $20.00 co-pay for Dental Clinic.

## 2018-03-26 ENCOUNTER — Encounter: Payer: Medicaid Other | Admitting: Internal Medicine

## 2018-04-03 ENCOUNTER — Ambulatory Visit: Payer: Medicaid Other

## 2018-04-18 ENCOUNTER — Other Ambulatory Visit: Payer: Medicaid Other

## 2018-04-18 ENCOUNTER — Other Ambulatory Visit: Payer: Self-pay | Admitting: Internal Medicine

## 2018-04-18 DIAGNOSIS — E119 Type 2 diabetes mellitus without complications: Secondary | ICD-10-CM

## 2018-04-18 DIAGNOSIS — Z0000 Encounter for general adult medical examination without abnormal findings: Secondary | ICD-10-CM

## 2018-04-18 DIAGNOSIS — I10 Essential (primary) hypertension: Secondary | ICD-10-CM

## 2018-04-18 DIAGNOSIS — E0500 Thyrotoxicosis with diffuse goiter without thyrotoxic crisis or storm: Secondary | ICD-10-CM

## 2018-04-21 LAB — COMPREHENSIVE METABOLIC PANEL
ALT: 19 IU/L (ref 0–32)
AST: 17 IU/L (ref 0–40)
Albumin/Globulin Ratio: 1.5 (ref 1.2–2.2)
Albumin: 4.6 g/dL (ref 3.8–4.8)
Alkaline Phosphatase: 116 IU/L (ref 39–117)
BUN/Creatinine Ratio: 14 (ref 12–28)
BUN: 11 mg/dL (ref 8–27)
Bilirubin Total: 0.5 mg/dL (ref 0.0–1.2)
CALCIUM: 9.7 mg/dL (ref 8.7–10.3)
CHLORIDE: 103 mmol/L (ref 96–106)
CO2: 19 mmol/L — ABNORMAL LOW (ref 20–29)
Creatinine, Ser: 0.8 mg/dL (ref 0.57–1.00)
GFR, EST AFRICAN AMERICAN: 92 mL/min/{1.73_m2} (ref >59)
GFR, EST NON AFRICAN AMERICAN: 80 mL/min/{1.73_m2} (ref >59)
GLUCOSE: 231 mg/dL — AB (ref 65–99)
Globulin, Total: 3 g/dL (ref 1.5–4.5)
POTASSIUM: 3.8 mmol/L (ref 3.5–5.2)
Sodium: 143 mmol/L (ref 134–144)
TOTAL PROTEIN: 7.6 g/dL (ref 6.0–8.5)

## 2018-04-21 LAB — CBC
HEMATOCRIT: 39.5 % (ref 34.0–46.6)
HEMOGLOBIN: 13.1 g/dL (ref 11.1–15.9)
MCH: 28.4 pg (ref 26.6–33.0)
MCHC: 33.2 g/dL (ref 31.5–35.7)
MCV: 86 fL (ref 79–97)
Platelets: 179 10*3/uL (ref 150–450)
RBC: 4.62 x10E6/uL (ref 3.77–5.28)
RDW: 13.6 % (ref 11.7–15.4)
WBC: 8.7 10*3/uL (ref 3.4–10.8)

## 2018-04-21 LAB — SPECIMEN STATUS REPORT

## 2018-04-21 LAB — TSH: TSH: 9.27 u[IU]/mL — AB (ref 0.450–4.500)

## 2018-04-21 LAB — HEPATITIS C ANTIBODY: Hep C Virus Ab: <0.1 s/co ratio (ref 0.0–0.9)

## 2018-04-21 LAB — HEMOGLOBIN A1C
ESTIMATED AVERAGE GLUCOSE: 203 mg/dL
HEMOGLOBIN A1C: 8.7 % — AB (ref 4.8–5.6)

## 2018-04-25 ENCOUNTER — Encounter: Payer: Self-pay | Admitting: Internal Medicine

## 2018-04-25 ENCOUNTER — Ambulatory Visit: Payer: Medicaid Other | Admitting: Internal Medicine

## 2018-04-25 VITALS — BP 146/81 | HR 81 | Temp 98.1°F | Ht 62.0 in | Wt 191.1 lb

## 2018-04-25 DIAGNOSIS — E1165 Type 2 diabetes mellitus with hyperglycemia: Principal | ICD-10-CM

## 2018-04-25 DIAGNOSIS — I10 Essential (primary) hypertension: Secondary | ICD-10-CM

## 2018-04-25 DIAGNOSIS — Z0000 Encounter for general adult medical examination without abnormal findings: Secondary | ICD-10-CM

## 2018-04-25 DIAGNOSIS — E0590 Thyrotoxicosis, unspecified without thyrotoxic crisis or storm: Secondary | ICD-10-CM

## 2018-04-25 DIAGNOSIS — IMO0001 Reserved for inherently not codable concepts without codable children: Secondary | ICD-10-CM

## 2018-04-25 MED ORDER — METFORMIN HCL 500 MG PO TABS: ORAL_TABLET | ORAL | 3 refills | Status: DC

## 2018-04-25 NOTE — Progress Notes (Signed)
Subjective:    Patient ID: Sarah [name], female    DOB: Jun 11, 1955, 63 y.o.   MRN: [id number]  HPI  Pt is a 63 year old female who presents for follow up appointment. Pt still has not had a surgery to repair her L hip.   Chief Complaint  Patient presents with  . Follow-up    Pt would like to review recent labs; Concerned about L hip pain and her "neck popping"    Review of Systems Patient Active Problem List   Diagnosis Date Noted  . Finger wound, simple, open, subsequent encounter 12/14/2017  . Felon of finger of left hand 12/14/2017  . Osteomyelitis of finger of left hand ([place]) 12/14/2017  . Primary osteoarthritis of left hip 02/28/2017  . Primary osteoarthritis of left knee 02/28/2017  . Hyperlipidemia associated with type 2 diabetes mellitus ([place]) 03/05/2015  . Biceps tendinitis 08/10/2014  . Essential (primary) hypertension 08/10/2014  . Hyperthyroidism 08/10/2014  . Uncontrolled type 2 diabetes mellitus without complication, without long-term current use of insulin ([place]) 08/10/2014   Allergies as of 04/25/2018      Reactions   Ace Inhibitors Nausea Only   Farxiga [dapagliflozin]    Vaginitis      Medication List       Accurate as of April 25, 2018 10:10 AM. Always use your most recent med list.        amLODipine 5 MG tablet Commonly known as:  NORVASC Take 1 tablet (5 mg total) by mouth daily.   atorvastatin 10 MG tablet Commonly known as:  LIPITOR Take 1 tablet (10 mg total) by mouth daily.   glimepiride 2 MG tablet Commonly known as:  AMARYL TAKE ONE TABLET BY MOUTH ONCE DAILY BEFORE  BREAKFAST   ibuprofen 200 MG tablet Commonly known as:  ADVIL,MOTRIN Take 600 mg by mouth daily. Takes when she does not have the meloxicam   meloxicam 15 MG tablet Commonly known as:  MOBIC Take 1 tablet (15 mg total) by mouth daily.   metFORMIN 500 MG tablet Commonly known as:  GLUCOPHAGE TAKE 1/2 TABLET BY MOUTH 2 TIMES A DAY   methimazole 10 MG  tablet Commonly known as:  TAPAZOLE Take 1 tablet (10 mg total) by mouth daily.          Objective:   Physical Exam Constitutional:      Appearance: Normal appearance.  Neurological:     Mental Status: She is alert and oriented to person, place, and time.    BP (!) 146/81   Pulse 81   Temp 98.1 F (36.7 C) (Oral)   Ht 5\' 2"  (1.575 m)   Wt 191 lb 1.6 oz (86.7 kg)   BMI 34.95 kg/m       Assessment & Plan:   1. Uncontrolled type 2 diabetes mellitus without complication, without long-term current use of insulin ([place]) Diabetes finds A1C elevated from previous value. Has had an [facility] Endocrinology evaluation since last visit who filled her medications as appropriate.   Refill Today:  - metFORMIN (GLUCOPHAGE) 500 MG tablet; TAKE 1/2 TABLET BY MOUTH 2 TIMES A DAY  Dispense: 90 tablet; Refill: 3  Check in 3 Months:  - Comprehensive metabolic panel; Future - CBC; Future - Hemoglobin A1c; Future  2. Hyperthyroidism Reviewed all records. Apparently diagnosed with hyperthyroidism with goiter in 2012 at [facility] confirmed by radiographic scan and laboratory studies.   TSH elevated where it was low on her last visit, seems to be fluctuating. Have  recognized she has been on thyroid suppression medication (Methimazole)   prescribed by outside physician prior to joining [facility]. Will ask [facility] Endocrinology to evaluate her thyroid status.  [facility] Scan Report 08/04/11; "FINDINGS: The right lobe of the thyroid measured 5.9 x 3.9 x 2.8 cm, with  the left measuring 7.7 x 2.7 x 3.1 cm, in size. Several heterogeneous  nodules are seen throughout the thyroid. The isthmus measured 0.76 mm AP.  There is hyperemic flow to the remaining nl thyroid.  The largest nodule on the right measured 4.3 x 2.9 x 3.6 cm, with the largest  on the left measuring 4 x 4.8 x 4 cm. The echotexture of the largest right  nodule heterogeneous with minimal internal vascularity. The echotexture of  the largest nodule on the left  was heterogeneous with minimal internal  vascularity. No calcifications are seen.  Multiple lymph nodes with fatty hila are seen bilaterally of questionable  clincial significance. The largest on the right is found at level I and  measures 1 cm. The largest on the left is found at level I and measures 0.8  cm."   Check in 3 Months: - T4; Future - TSH; Future  3. Essential Hypertension BP close to target.   4. Healthcare Maintenance L hip continues to bother her. Still waiting on surgical intervention at [facility].   Dental care is persist, waiting on dental referral.    RTE in 3 months with labs a week prior

## 2018-05-15 ENCOUNTER — Ambulatory Visit: Payer: Medicaid Other | Admitting: Endocrinology

## 2018-05-15 VITALS — BP 160/91 | HR 84 | Temp 98.2°F | Ht 62.0 in | Wt 191.7 lb

## 2018-05-15 DIAGNOSIS — IMO0001 Reserved for inherently not codable concepts without codable children: Secondary | ICD-10-CM

## 2018-05-15 DIAGNOSIS — E1165 Type 2 diabetes mellitus with hyperglycemia: Principal | ICD-10-CM

## 2018-05-15 DIAGNOSIS — E0500 Thyrotoxicosis with diffuse goiter without thyrotoxic crisis or storm: Secondary | ICD-10-CM

## 2018-05-15 LAB — GLUCOSE, POCT (MANUAL RESULT ENTRY): POC Glucose: 142 mg/dl — AB (ref 70–99)

## 2018-05-15 MED ORDER — METFORMIN HCL 500 MG PO TABS: 1000.0000 mg | ORAL_TABLET | Freq: Two times a day (BID) | ORAL | 3 refills | Status: DC

## 2018-05-15 MED ORDER — METHIMAZOLE 10 MG PO TABS: 5.0000 mg | ORAL_TABLET | Freq: Every day | ORAL | 3 refills | Status: DC

## 2018-05-15 MED ORDER — LOSARTAN POTASSIUM 50 MG PO TABS: 50.0000 mg | ORAL_TABLET | Freq: Every day | ORAL | 3 refills | Status: DC

## 2018-05-15 NOTE — Progress Notes (Signed)
Follow up Diabetes/ Endocrine [facility]     Patient ID: Sarah [name], female   DOB: [birth year], 63 y.o.   MRN: [id number] Assessment:  Sarah [name] is a 63 y.o. female who is seen in follow up for Uncontrolled type 2 diabetes mellitus without complication, without long-term current use of insulin (Sarah [name]) [E11.65] at the request of Tawni Millers, MD.  Encounter Diagnoses 1. Uncontrolled type 2 diabetes mellitus without complication, without long-term current use of insulin (HCC)     Assessment    Plan:     1. Week 1: 500mg  in the morning, 500mg  at night Week 2: 1000mg  in the morning, 500mg  at night Week 3: 1000mg  in the morning, 1000mg  at night  2. Add losartan 50mg /day to help with blood pressure. Come back to clinic in 2 weeks for a lab check to make sure that losartan is working.  3. Decrease your methimazole dose from 10mg  to 5mg .    There are no Patient Instructions on file for this visit.   Orders Placed This Encounter  Procedures  . POCT Glucose (CBG)     Subjective:  HPI   Review of Systems  Sarah [name]  has a past medical history of Diabetes mellitus without complication (Sarah [name]).  Pt presents for f/u care of T2DM. No current complaints.  A1C: 8.7 (04/18/2018) Blood Sugar: 150-200 typically Meds: Currently takes 500mg  total of metform daily, 2mg  of glimeperide. Diet: trying to cut back on soda and just drink water. Only drinks 3 sodas/week now. Doesn't regularly eat breakfast, typically has sandwich for lunch. Dinner is varied. Exercise: not currently working, spends days resting at home. Hip pain is limiting, although meloxicam helps. Will have hip replacement after pre-op dental work is completed.  Current Outpatient Medications on File Prior to Visit  Medication Sig Dispense Refill  . amLODipine (NORVASC) 5 MG tablet Take 1 tablet (5 mg total) by mouth daily. 30 tablet 11  . atorvastatin (LIPITOR) 10 MG tablet Take 1 tablet (10 mg  total) by mouth daily. 90 tablet 3  . glimepiride (AMARYL) 2 MG tablet TAKE ONE TABLET BY MOUTH ONCE DAILY BEFORE  BREAKFAST 90 tablet 3  . ibuprofen (ADVIL,MOTRIN) 200 MG tablet Take 600 mg by mouth daily. Takes when she does not have the meloxicam    . meloxicam (MOBIC) 15 MG tablet Take 1 tablet (15 mg total) by mouth daily. 30 tablet 3  . metFORMIN (GLUCOPHAGE) 500 MG tablet TAKE 1/2 TABLET BY MOUTH 2 TIMES A DAY 90 tablet 3  . methimazole (TAPAZOLE) 10 MG tablet Take 1 tablet (10 mg total) by mouth daily. 90 tablet 3   No current facility-administered medications on file prior to visit.    Family History, Social History, current Medications and allergies reviewed and updated in Epic.  Objective:    Blood pressure (!) 160/91, pulse 84, temperature 98.2 F (36.8 C), temperature source Oral, height 5\' 2"  (1.575 m), weight 191 lb 11.2 oz (87 kg). Physical Exam  Eyes - moderate right-sided proptosis Cardio - normal pulm - normal     Data : I have personally reviewed pertinent labs and imaging studies, if indicated,  with the patient in clinic today.    Lab Orders     POCT Glucose (CBG)  HC Readings from Last 3 Encounters:  No data found for [facility]    Wt Readings from Last 3 Encounters:  05/15/18 191 lb 11.2 oz (87 kg)  04/25/18 191 lb 1.6 oz (86.7  kg)  02/13/18 190 lb 8 oz (86.4 kg)

## 2018-05-21 ENCOUNTER — Encounter: Payer: Self-pay | Admitting: Pharmacist

## 2018-05-21 ENCOUNTER — Other Ambulatory Visit: Payer: Self-pay

## 2018-05-21 ENCOUNTER — Ambulatory Visit: Payer: Medicaid Other | Admitting: Pharmacist

## 2018-05-21 VITALS — BP 150/70 | Wt 190.0 lb

## 2018-05-21 DIAGNOSIS — Z79899 Other long term (current) drug therapy: Secondary | ICD-10-CM

## 2018-05-21 NOTE — Progress Notes (Signed)
[facility] Visit Note  Patient: Sarah [name] MRN: [id number] Date of Birth: Jun 14, 1955 PCP: Tawni Millers, MD   Alean Rinne Simmers 63 y.o. female presents for a medication management visit today.  BP (!) 150/70 (BP Location: Right Arm, Patient Position: Sitting, Cuff Size: Large)   Wt 190 lb (86.2 kg)   BMI 34.75 kg/m   Patient Information   Past Medical History:  Diagnosis Date  . Diabetes mellitus without complication ([place])   . Hyperlipidemia   . Hypertension   . Thyroid disease       Past Surgical History:  Procedure Laterality Date  . ABDOMINAL HYSTERECTOMY    . AMPUTATION Left 12/15/2017   Procedure: AMPUTATION DIGIT left long finger;  Surgeon: Dereck Leep, MD;  Location: [facility] ORS;  Service: Orthopedics;  Laterality: Left;  . PARTIAL HYSTERECTOMY       Family History  Problem Relation Age of Onset  . Diabetes Mother   . Diabetes Sister   . Breast cancer Neg Hx     Family Support: most of family works and is not around often.  Lifestyle Diet: Breakfast: cereal (Cheerios) normally around 12 Lunch/dinner: soup around 5-6 pm Snacks: popcorn, peanut butter crackers (around lunchtime) Drinks: water, cutting back on soda    Current Exercise Habits: The patient does not participate in regular exercise at present       Social History   Substance and Sexual Activity  Alcohol Use No  . Alcohol/week: 0.0 standard drinks      Social History   Tobacco Use  Smoking Status Current Some Day Smoker  . Types: Cigarettes  Smokeless Tobacco Never Used      Health Maintenance  Topic Date Due  . COLONOSCOPY  03/02/2007  . FOOT EXAM  03/23/2018  . MAMMOGRAM  08/01/2018  . HEMOGLOBIN A1C  10/17/2018  . OPHTHALMOLOGY EXAM  12/29/2018  . TETANUS/TDAP  06/26/2021  . INFLUENZA VACCINE  Completed  . PNEUMOCOCCAL POLYSACCHARIDE VACCINE AGE 71-64 HIGH RISK  Completed  . Hepatitis C Screening  Completed  . HIV Screening  Completed    Outpatient Encounter Medications as of 05/21/2018  Medication Sig  . amLODipine (NORVASC) 5 MG tablet Take 1 tablet (5 mg total) by mouth daily.  Marland Kitchen atorvastatin (LIPITOR) 10 MG tablet Take 1 tablet (10 mg total) by mouth daily.  Marland Kitchen glimepiride (AMARYL) 2 MG tablet TAKE ONE TABLET BY MOUTH ONCE DAILY BEFORE  BREAKFAST  . ibuprofen (ADVIL,MOTRIN) 200 MG tablet Take 600 mg by mouth daily. Takes when she does not have the meloxicam  . meloxicam (MOBIC) 15 MG tablet Take 1 tablet (15 mg total) by mouth daily.  . metFORMIN (GLUCOPHAGE) 500 MG tablet Take 2 tablets (1,000 mg total) by mouth 2 (two) times daily with a meal. TAKE 1/2 TABLET BY MOUTH 2 TIMES A DAY (Patient taking differently: Take 500 mg by mouth 2 (two) times daily with a meal. )  . methimazole (TAPAZOLE) 10 MG tablet Take 0.5 tablets (5 mg total) by mouth daily.  Marland Kitchen losartan (COZAAR) 50 MG tablet Take 1 tablet (50 mg total) by mouth daily. (Patient not taking: Reported on 05/21/2018)   No facility-administered encounter medications on file as of 05/21/2018.    Health Maintenance/Date Completed  Last ED visit: September 2019 Last Visit to PCP: end of January Next Visit to PCP: not sure Specialist Visit: endocrinologist Dental Exam: last week Eye Exam: last year  Pelvic/PAP Exam: hysterectomy Mammogram: last March Colonoscopy: last year Flu  Vaccine: yes Pneumonia Vaccine: no   Assessment and Plan: HTN: amlodipine. BP has been consistently elevated so losartan added last week at [facility]. Pharmacy did not receive prescription in order to fill. We called and were able to get prescription sent over from [facility] and filled for her today. BP today still elevated in the setting of not having losartan. Counseled patient on starting losartan and to call to have labs drawn as PCP sees fit.  DM: metformin, glimepiride. A1C 8.7 in January. Checks sugar once a day in the morning before she eats. Typically runs in the 200s. [facility], increased metformin from 250 mg BID to 500 mg BID. Does not report any GI issues with metformin. Reports trying to eat better and avoid sweets, sodas, etc.  H/o Grave's disease: methimazole. TSH 9.27 in January. Methimazole decreased last week from 10 mg to 5 mg. Patient does not report any symptoms.  HLD: atorvastatin. No issues  Hip pain: meloxicam, ibuprofen. Does not use together. Uses ibuprofen only when she does not have her meloxicam. Otherwise takes meloxicam once a day.  Compliance: reports taking medications every day. Brought medications with her today. Does not need refills. We were able to fill the losartan for her today.  Tawnya Crook, PharmD Pharmacy Resident

## 2018-06-27 ENCOUNTER — Telehealth: Payer: Self-pay | Admitting: Pharmacy Technician

## 2018-06-27 NOTE — Telephone Encounter (Signed)
Received 2020 proof of income.  Patient eligible to receive medication assistance at [facility] as long as eligibility requirements continue to be met.  [facility]

## 2018-07-18 ENCOUNTER — Other Ambulatory Visit: Payer: Medicaid Other

## 2018-07-25 ENCOUNTER — Ambulatory Visit: Payer: Medicaid Other | Admitting: Internal Medicine

## 2018-07-25 DIAGNOSIS — E0500 Thyrotoxicosis with diffuse goiter without thyrotoxic crisis or storm: Secondary | ICD-10-CM | POA: Insufficient documentation

## 2018-07-25 DIAGNOSIS — IMO0001 Reserved for inherently not codable concepts without codable children: Secondary | ICD-10-CM

## 2018-07-25 DIAGNOSIS — E1165 Type 2 diabetes mellitus with hyperglycemia: Principal | ICD-10-CM

## 2018-07-25 NOTE — Progress Notes (Signed)
   Subjective:    Patient ID: Sarah [name], female    DOB: [birth year], 63 y.o.   MRN: [id number]  HPI  Patient presents for telephonic follow up with [facility]. She states that she does not need any medication refills at this time.  Review of Systems  Patient Active Problem List   Diagnosis Date Noted  . Ophthalmic manifestation of Graves disease 07/25/2018  . Finger wound, simple, open, subsequent encounter 12/14/2017  . Felon of finger of left hand 12/14/2017  . Osteomyelitis of finger of left hand ([place]) 12/14/2017  . Primary osteoarthritis of left hip 02/28/2017  . Primary osteoarthritis of left knee 02/28/2017  . Hyperlipidemia associated with type 2 diabetes mellitus ([place]) 03/05/2015  . Biceps tendinitis 08/10/2014  . Essential (primary) hypertension 08/10/2014  . Hyperthyroidism 08/10/2014  . Uncontrolled type 2 diabetes mellitus without complication, without long-term current use of insulin ([place]) 08/10/2014   Allergies as of 07/25/2018      Reactions   Ace Inhibitors Nausea Only   Farxiga [dapagliflozin]    Vaginitis      Medication List       Accurate as of July 25, 2018 10:34 AM. Always use your most recent med list.        amLODipine 5 MG tablet Commonly known as:  NORVASC Take 1 tablet (5 mg total) by mouth daily.   atorvastatin 10 MG tablet Commonly known as:  LIPITOR Take 1 tablet (10 mg total) by mouth daily.   glimepiride 2 MG tablet Commonly known as:  AMARYL TAKE ONE TABLET BY MOUTH ONCE DAILY BEFORE  BREAKFAST   ibuprofen 200 MG tablet Commonly known as:  ADVIL Take 600 mg by mouth daily. Takes when she does not have the meloxicam   losartan 50 MG tablet Commonly known as:  COZAAR Take 1 tablet (50 mg total) by mouth daily.   meloxicam 15 MG tablet Commonly known as:  MOBIC Take 1 tablet (15 mg total) by mouth daily.   metFORMIN 500 MG tablet Commonly known as:  GLUCOPHAGE Take 2 tablets (1,000 mg total) by mouth 2 (two)  times daily with a meal. TAKE 1/2 TABLET BY MOUTH 2 TIMES A DAY   methimazole 10 MG tablet Commonly known as:  TAPAZOLE Take 0.5 tablets (5 mg total) by mouth daily.          Objective:   Physical Exam  Telephonic Visit       Assessment & Plan:   1. Uncontrolled type 2 diabetes mellitus without complication, without long-term current use of insulin ([place])  Patient reported her sugars were remaining under 200. She did report any new symptoms or concerns related to her diabetes.   Follow up with [facility] scheduled in May 2020.   2. Ophthalmic manifestation of Graves disease  Refer patient to [facility] as soon in-person visits resume. This was recommended by [facility] and patient also stated today that she had some concerns about her R eye.    Follow up in 3 MO with labs a week prior (Met C, CBC, A1c, T4, TSH)

## 2018-08-14 ENCOUNTER — Ambulatory Visit: Payer: Medicaid Other | Admitting: Endocrinology

## 2018-08-14 ENCOUNTER — Ambulatory Visit: Payer: Medicaid Other

## 2018-08-14 ENCOUNTER — Other Ambulatory Visit: Payer: Self-pay

## 2018-08-14 DIAGNOSIS — I10 Essential (primary) hypertension: Secondary | ICD-10-CM

## 2018-08-14 DIAGNOSIS — IMO0001 Reserved for inherently not codable concepts without codable children: Secondary | ICD-10-CM

## 2018-08-14 DIAGNOSIS — E1169 Type 2 diabetes mellitus with other specified complication: Secondary | ICD-10-CM

## 2018-08-14 DIAGNOSIS — E0590 Thyrotoxicosis, unspecified without thyrotoxic crisis or storm: Secondary | ICD-10-CM

## 2018-08-14 MED ORDER — GLIMEPIRIDE 2 MG PO TABS: ORAL_TABLET | ORAL | 3 refills | Status: DC

## 2018-08-14 MED ORDER — AMLODIPINE BESYLATE 5 MG PO TABS: 5.0000 mg | ORAL_TABLET | Freq: Every day | ORAL | 11 refills | Status: DC

## 2018-08-14 MED ORDER — PIOGLITAZONE HCL 15 MG PO TABS: 15.0000 mg | ORAL_TABLET | Freq: Every day | ORAL | 4 refills | Status: DC

## 2018-08-14 MED ORDER — ATORVASTATIN CALCIUM 10 MG PO TABS: 10.0000 mg | ORAL_TABLET | Freq: Every day | ORAL | 3 refills | Status: DC

## 2018-08-14 MED ORDER — METFORMIN HCL 500 MG PO TABS: 500.0000 mg | ORAL_TABLET | Freq: Two times a day (BID) | ORAL | 4 refills | Status: DC

## 2018-08-14 NOTE — Progress Notes (Signed)
ASSESSMENT and PLAN:   Sarah [name] is a 63 y.o. female returning for follow-up of T2DM for 4-5 years and hyperthyroidism complicated by hypertension, tobacco use, and physical inactivity last seen by the [facility] on May 15, 2018. Follow up appointment with Dr. Mable Fill in July. Follow up with [facility] as well in July.  T2DM  Her most recent Hb1Ac is 8.7% (04/18/2018) and is currently managing T2DM with 500mg  metformin BID and 2mg  of glimeperide QD. She reports checking blood sugars BID -- once in the morning before eating -- with reported blood sugars in the range of 100-200. Hemoglobin A1c and CMP are pending. Order urinary Microalb:Cr. Encouraged cessation of soda-use and exercise as tolerated. Denies missing doses. Suggested started Victoza but patient was resistant to shots. Patient reported an "infection" from a previous try with jardiance. Started pioglitazone 15mg  but should consider following up on Victoza if patient is willing.    Hyperthroidism On her previous visit with the [facility], she was instructed to decrease her methimazole dose from 10mg  to 5mg  after TSH of 9.270 (04/18/2018). Future TSH and T4 orders are pending.  We asked her to get her blood drawn soon as her TSH may continue to be elevated.   Hypertension Blood pressures need be checked. Medication management to confirm antihypertensive medications. Patient reports starting metropolol 25mg  BID as well as losartan 50mg  QD.   SUBJECTIVE HPI T2DM Denies symptoms of hypoglycemia, gastroparesis, polydipsia, polyuria, urinary incontinence and neuropathy. Currently does not endorse much exercise due to hip pain managed by meloxicam. Was scheduled for a hip replacement after dental work was completed. Endorses some soda use (1-can/day) and typically does not eat breakfast but will have a sandwich/carbs for lunch while dinner is varied.   During her previous visit with the [facility], she was instructed to increase her metformin on the  following schedule: Week 1: 500mg  in the morning, 500mg  at night; Week 2: 1000mg  in the morning, 500mg  at night; Week 3: 1000mg  in the morning, 1000mg  at night.   Hyperthyroidism During her previous visit with the [facility], she was instructed to decrease methimazole dose from 10mg  to 5mg .  Hypertension During her previous visit with the [facility], she was instructed to start losartan 50mg /day to help with blood pressure. Patient was not taking on 05/21/2018 and blood pressures were 150/70. Patient started medication "a couple of weeks ago" and also reports taking metropolol 25mg  BID. Denies syncopal episodes.     Other problems include:  Patient Active Problem List   Diagnosis Date Noted   Ophthalmic manifestation of Graves disease 07/25/2018   Finger wound, simple, open, subsequent encounter 12/14/2017   Felon of finger of left hand 12/14/2017   Osteomyelitis of finger of left hand ([place]) 12/14/2017   Primary osteoarthritis of left hip 02/28/2017   Primary osteoarthritis of left knee 02/28/2017   Hyperlipidemia associated with type 2 diabetes mellitus ([place]) 03/05/2015   Biceps tendinitis 08/10/2014   Essential (primary) hypertension 08/10/2014   Hyperthyroidism 08/10/2014   Uncontrolled type 2 diabetes mellitus without complication, without long-term current use of insulin ([place]) 08/10/2014    Her current medications include: Current Outpatient Medications  Medication Sig Dispense Refill   amLODipine (NORVASC) 5 MG tablet Take 1 tablet (5 mg total) by mouth daily. 30 tablet 11   atorvastatin (LIPITOR) 10 MG tablet Take 1 tablet (10 mg total) by mouth daily. 90 tablet 3   glimepiride (AMARYL) 2 MG tablet TAKE ONE TABLET BY MOUTH ONCE DAILY BEFORE  BREAKFAST 90 tablet  3   ibuprofen (ADVIL,MOTRIN) 200 MG tablet Take 600 mg by mouth daily. Takes when she does not have the meloxicam     losartan (COZAAR) 50 MG tablet Take 1 tablet (50 mg total) by mouth daily. (Patient not taking:  Reported on 05/21/2018) 90 tablet 3   meloxicam (MOBIC) 15 MG tablet Take 1 tablet (15 mg total) by mouth daily. 30 tablet 3   metFORMIN (GLUCOPHAGE) 500 MG tablet Take 2 tablets (1,000 mg total) by mouth 2 (two) times daily with a meal. TAKE 1/2 TABLET BY MOUTH 2 TIMES A DAY (Patient taking differently: Take 500 mg by mouth 2 (two) times daily with a meal. ) 360 tablet 3   methimazole (TAPAZOLE) 10 MG tablet Take 0.5 tablets (5 mg total) by mouth daily. 45 tablet 3   No current facility-administered medications for this visit.      Recent labs:  Results for orders placed or performed in visit on 05/15/18  POCT Glucose (CBG)  Result Value Ref Range   POC Glucose 142 (A) 70 - 99 mg/dl

## 2018-08-14 NOTE — Patient Instructions (Signed)
Start pioglitazone once a day Watch for leg swelling Pioglitazone tablets What is this medicine? PIOGLITAZONE (pye oh GLI ta zone) helps to treat type 2 diabetes. It helps to control blood sugar. Treatment is combined with diet and exercise. This medicine may be used for other purposes; ask your health care provider or pharmacist if you have questions. COMMON BRAND NAME(S): Actos What should I tell my health care provider before I take this medicine? They need to know if you have any of these conditions: -bladder cancer -diabetic ketoacidosis -eye disease called macular edema -heart disease -heart failure -kidney disease -liver disease -polycystic ovary syndrome -premenopausal -swelling of the arms, legs, or feet -type 1 diabetes -an unusual or allergic reaction to pioglitazone, other medicines, foods, dyes, or preservatives -pregnant or trying to get pregnant -breast-feeding How should I use this medicine? Take this medicine by mouth with a glass of water. Follow the directions on the prescription label. Take your medicine at the same time each day. Do not take more often than directed. A special MedGuide will be given to you by the pharmacist with each prescription and refill. Be sure to read this information carefully each time. Talk to your pediatrician regarding the use of this medicine in children. Special care may be needed. Overdosage: If you think you have taken too much of this medicine contact a poison control center or emergency room at once. NOTE: This medicine is only for you. Do not share this medicine with others. What if I miss a dose? If you miss a dose, take it as soon as you can. If it is almost time for your next dose, take only that dose. Do not take double or extra doses. What may interact with this medicine? -atorvastatin -birth control pills or other hormonal methods of birth control -bosentan -itraconazole -ketoconazole -midazolam -nifedipine -other  medicines for diabetes, including insulin -topiramate Many medications may cause an increase or decrease in blood sugar, these include: -alcohol containing beverages -aspirin and aspirin-like drugs -chloramphenicol -chromium -diuretics -female hormones, like estrogens or progestins and birth control pills -heart medicines -isoniazid -female hormones or anabolic steroids -medicines for weight loss -medicines for allergies, asthma, cold, or cough -medicines for mental problems -medicines called MAO Inhibitors like Nardil, Parnate, Marplan, Eldepryl -niacin -NSAIDs, medicines for pain and inflammation, like ibuprofen or naproxen -pentamidine -phenytoin -probenecid -quinolone antibiotics like ciprofloxacin, levofloxacin, ofloxacin -some herbal dietary supplements -steroid medicines like prednisone or cortisone -thyroid medicine This list may not describe all possible interactions. Give your health care provider a list of all the medicines, herbs, non-prescription drugs, or dietary supplements you use. Also tell them if you smoke, drink alcohol, or use illegal drugs. Some items may interact with your medicine. What should I watch for while using this medicine? Visit your doctor or health care professional for regular checks on your progress. You may need regular tests to make sure your liver is working properly. A test called the HbA1C (A1C) will be monitored. This is a simple blood test. It measures your blood sugar control over the last 2 to 3 months. You will receive this test every 3 to 6 months. Learn how to check your blood sugar. Learn the symptoms of low and high blood sugar and how to manage them. Always carry a quick-source of sugar with you in case you have symptoms of low blood sugar. Examples include hard sugar candy or glucose tablets. Make sure others know that you can choke if you eat or drink when you  develop serious symptoms of low blood sugar, such as seizures or  unconsciousness. They must get medical help at once. Tell your doctor or health care professional if you have high blood sugar. You might need to change the dose of your medicine. If you are sick or exercising more than usual, you might need to change the dose of your medicine. Do not skip meals. Ask your doctor or health care professional if you should avoid alcohol. Many nonprescription cough and cold products contain sugar or alcohol. These can affect blood sugar. This medicine may increase your risk of having certain heart problems. Get medical help right away if you have any chest pain or tightness, or pain that radiates to the jaw or down the arm, and shortness of breath. These may be signs of a serious medical condition. This medicine may cause ovulation in premenopausal women who do not have regular monthly periods. This may increase your chances of becoming pregnant. You should not take this medicine if you become pregnant or think you may be pregnant. Talk with your doctor or health care professional about your birth control options while taking this medicine. Contact your doctor or health care professional right away if think you are pregnant. Wear a medical ID bracelet or chain, and carry a card that describes your disease and details of your medicine and dosage times. What side effects may I notice from receiving this medicine? Side effects that you should report to your doctor or health care professional as soon as possible: -allergic reactions like skin rash, itching or hives, swelling of the face, lips, or tongue -blood in the urine -bone fractures -breathing problems -changes in vision -dark urine -fever, chills -increased urination -pain when urinating -signs and symptoms of low blood sugar such as feeling anxious, confusion, dizziness, increased hunger, unusually weak or tired, sweating, shakiness, cold, irritable, headache, blurred vision, fast heartbeat, loss of  consciousness -sudden weight gain -swelling of the ankles, feet, hands -yellowing of the eyes or skin Side effects that usually do not require medical attention (report to your doctor or health care professional if they continue or are bothersome): -headache -mild joint or muscle pain -stuffy or runny nose -sore throat This list may not describe all possible side effects. Call your doctor for medical advice about side effects. You may report side effects to FDA at 1-800-FDA-1088. Where should I keep my medicine? Keep out of the reach of children. Store at room temperature between 15 and 30 degrees C (59 and 86 degrees F). Keep container tightly closed and protect from moisture and humidity. Throw away any unused medicine after the expiration date. NOTE: This sheet is a summary. It may not cover all possible information. If you have questions about this medicine, talk to your doctor, pharmacist, or health care provider.  2019 Elsevier/Gold Standard (2012-06-26 14:51:48)

## 2018-09-05 ENCOUNTER — Other Ambulatory Visit: Payer: Medicaid Other

## 2018-09-05 ENCOUNTER — Other Ambulatory Visit: Payer: Self-pay

## 2018-09-06 ENCOUNTER — Other Ambulatory Visit: Payer: Medicaid Other

## 2018-09-06 DIAGNOSIS — IMO0001 Reserved for inherently not codable concepts without codable children: Secondary | ICD-10-CM

## 2018-09-06 DIAGNOSIS — E0590 Thyrotoxicosis, unspecified without thyrotoxic crisis or storm: Secondary | ICD-10-CM

## 2018-09-07 LAB — COMPREHENSIVE METABOLIC PANEL
ALT: 17 IU/L (ref 0–32)
AST: 19 IU/L (ref 0–40)
Albumin/Globulin Ratio: 1.6 (ref 1.2–2.2)
Albumin: 4.6 g/dL (ref 3.8–4.8)
Alkaline Phosphatase: 90 IU/L (ref 39–117)
BUN/Creatinine Ratio: 23 (ref 12–28)
BUN: 19 mg/dL (ref 8–27)
Bilirubin Total: 0.3 mg/dL (ref 0.0–1.2)
CO2: 21 mmol/L (ref 20–29)
Calcium: 9.6 mg/dL (ref 8.7–10.3)
Chloride: 105 mmol/L (ref 96–106)
Creatinine, Ser: 0.84 mg/dL (ref 0.57–1.00)
GFR calc Af Amer: 87 mL/min/{1.73_m2} (ref >59)
GFR calc non Af Amer: 75 mL/min/{1.73_m2} (ref >59)
Globulin, Total: 2.8 g/dL (ref 1.5–4.5)
Glucose: 67 mg/dL (ref 65–99)
Potassium: 3.9 mmol/L (ref 3.5–5.2)
Sodium: 140 mmol/L (ref 134–144)
Total Protein: 7.4 g/dL (ref 6.0–8.5)

## 2018-09-07 LAB — CBC
Hematocrit: 37 % (ref 34.0–46.6)
Hemoglobin: 12.4 g/dL (ref 11.1–15.9)
MCH: 28.8 pg (ref 26.6–33.0)
MCHC: 33.5 g/dL (ref 31.5–35.7)
MCV: 86 fL (ref 79–97)
Platelets: 182 10*3/uL (ref 150–450)
RBC: 4.3 x10E6/uL (ref 3.77–5.28)
RDW: 13.5 % (ref 11.7–15.4)
WBC: 11.1 10*3/uL — ABNORMAL HIGH (ref 3.4–10.8)

## 2018-09-07 LAB — HEMOGLOBIN A1C
Est. average glucose Bld gHb Est-mCnc: 166 mg/dL
Hgb A1c MFr Bld: 7.4 % — ABNORMAL HIGH (ref 4.8–5.6)

## 2018-09-07 LAB — T4: T4, Total: 7.2 ug/dL (ref 4.5–12.0)

## 2018-09-07 LAB — TSH: TSH: 1.27 u[IU]/mL (ref 0.450–4.500)

## 2018-09-18 ENCOUNTER — Other Ambulatory Visit: Payer: Self-pay | Admitting: Internal Medicine

## 2018-10-17 ENCOUNTER — Other Ambulatory Visit: Payer: Medicaid Other

## 2018-10-24 ENCOUNTER — Other Ambulatory Visit: Payer: Self-pay

## 2018-10-24 ENCOUNTER — Ambulatory Visit: Payer: Medicaid Other | Admitting: Internal Medicine

## 2018-10-24 DIAGNOSIS — E119 Type 2 diabetes mellitus without complications: Secondary | ICD-10-CM

## 2018-10-24 DIAGNOSIS — E0590 Thyrotoxicosis, unspecified without thyrotoxic crisis or storm: Secondary | ICD-10-CM

## 2018-10-24 NOTE — Progress Notes (Signed)
   Subjective:    Patient ID: Sarah [name], female    DOB: [birth year], 63 y.o.   MRN: [id number]  HPI  Patient is a 63 year old female presenting for a telephonic visit.  Review of Systems Patient Active Problem List   Diagnosis Date Noted  . Ophthalmic manifestation of Graves disease 07/25/2018  . Finger wound, simple, open, subsequent encounter 12/14/2017  . Felon of finger of left hand 12/14/2017  . Osteomyelitis of finger of left hand ([place]) 12/14/2017  . Primary osteoarthritis of left hip 02/28/2017  . Primary osteoarthritis of left knee 02/28/2017  . Hyperlipidemia associated with type 2 diabetes mellitus ([place]) 03/05/2015  . Biceps tendinitis 08/10/2014  . Essential (primary) hypertension 08/10/2014  . Hyperthyroidism 08/10/2014  . Uncontrolled type 2 diabetes mellitus without complication, without long-term current use of insulin ([place]) 08/10/2014   Allergies as of 10/24/2018      Reactions   Ace Inhibitors Nausea Only   Farxiga [dapagliflozin]    Vaginitis      Medication List       Accurate as of October 24, 2018 10:03 AM. If you have any questions, ask your nurse or doctor.        amLODipine 5 MG tablet Commonly known as: NORVASC Take 1 tablet (5 mg total) by mouth daily.   atorvastatin 10 MG tablet Commonly known as: LIPITOR Take 1 tablet (10 mg total) by mouth daily.   glimepiride 2 MG tablet Commonly known as: AMARYL TAKE ONE TABLET BY MOUTH ONCE DAILY BEFORE  BREAKFAST   ibuprofen 200 MG tablet Commonly known as: ADVIL Take 600 mg by mouth daily. Takes when she does not have the meloxicam   losartan 50 MG tablet Commonly known as: COZAAR Take 1 tablet (50 mg total) by mouth daily.   meloxicam 15 MG tablet Commonly known as: MOBIC Take 1 tablet (15 mg total) by mouth daily.   metFORMIN 500 MG tablet Commonly known as: GLUCOPHAGE Take 1 tablet (500 mg total) by mouth 2 (two) times daily with a meal. TAKE 1/2 TABLET BY MOUTH 2 TIMES A DAY  methimazole 10 MG tablet Commonly known as: TAPAZOLE Take 0.5 tablets (5 mg total) by mouth daily.   pioglitazone 15 MG tablet Commonly known as: Actos Take 1 tablet (15 mg total) by mouth daily.          Objective:   Physical Exam   No PE - telephonic visit     Assessment & Plan:  1. Diabetes  Pt reports she is doing well. Last a1c has shown improvement.  2. Hyperthyroidism Pt continues to be asymptomatic. Last tsh shows to be therapeutic.   Labs ordered and 69mo fu has been scheduled: - a1c - metc - cbc - ua+reflex - tsh - t4 - lipid panel

## 2018-11-22 ENCOUNTER — Other Ambulatory Visit: Payer: Self-pay | Admitting: Internal Medicine

## 2018-11-22 DIAGNOSIS — I10 Essential (primary) hypertension: Secondary | ICD-10-CM

## 2018-11-22 DIAGNOSIS — E0500 Thyrotoxicosis with diffuse goiter without thyrotoxic crisis or storm: Secondary | ICD-10-CM

## 2018-11-22 DIAGNOSIS — M1612 Unilateral primary osteoarthritis, left hip: Secondary | ICD-10-CM

## 2018-11-28 ENCOUNTER — Other Ambulatory Visit: Payer: Self-pay

## 2018-11-28 DIAGNOSIS — E0500 Thyrotoxicosis with diffuse goiter without thyrotoxic crisis or storm: Secondary | ICD-10-CM

## 2018-11-28 DIAGNOSIS — I10 Essential (primary) hypertension: Secondary | ICD-10-CM

## 2018-11-28 MED ORDER — METOPROLOL TARTRATE 25 MG PO TABS: ORAL_TABLET | ORAL | 1 refills | Status: DC

## 2018-11-28 MED ORDER — METHIMAZOLE 10 MG PO TABS: 5.0000 mg | ORAL_TABLET | Freq: Every day | ORAL | 0 refills | Status: DC

## 2018-11-29 ENCOUNTER — Other Ambulatory Visit: Payer: Medicaid Other | Admitting: Pharmacist

## 2018-12-25 ENCOUNTER — Telehealth: Payer: Self-pay | Admitting: Gerontology

## 2018-12-25 NOTE — Telephone Encounter (Signed)
Called pt on 9/29 @3 :15 pm to schedule endocrinology appointment. Appointment scheduled for 11/17.

## 2018-12-31 ENCOUNTER — Other Ambulatory Visit: Payer: Self-pay

## 2018-12-31 ENCOUNTER — Encounter (INDEPENDENT_AMBULATORY_CARE_PROVIDER_SITE_OTHER): Payer: Self-pay

## 2018-12-31 ENCOUNTER — Ambulatory Visit: Payer: Medicaid Other

## 2019-01-01 NOTE — Progress Notes (Cosign Needed)
[facility]ication Management Clinic Visit Note  Patient: Sarah [name] MRN: [id number] Date of Birth: 01-May-1955 PCP: Tawni Millers, MD   [name] 63 y.o. female presents for a medication therapy management visit today.  There were no vitals taken for this visit.  Patient Information   Past Medical History:  Diagnosis Date  . Diabetes mellitus without complication ([place])   . Hyperlipidemia   . Hypertension   . Thyroid disease       Past Surgical History:  Procedure Laterality Date  . ABDOMINAL HYSTERECTOMY    . AMPUTATION Left 12/15/2017   Procedure: AMPUTATION DIGIT left long finger;  Surgeon: Dereck Leep, MD;  Location: [facility] ORS;  Service: Orthopedics;  Laterality: Left;  . PARTIAL HYSTERECTOMY       Family History  Problem Relation Age of Onset  . Diabetes Mother   . Diabetes Sister   . Stroke Sister   . Breast cancer Neg Hx    New Diagnoses (since last visit): none   Family Support: Good  Lifestyle Diet: Breakfast:cereal with 2%milk Lunch: crackers (peanut butter) or tuna Dinner: baked chicken, cabbage, green beans, pinto beans Drinks:coffee (low fat milk and sugar), regular soda, and water Snacks: fruit cups, peaches, ice cream       Social History   Substance and Sexual Activity  Alcohol Use No  . Alcohol/week: 0.0 standard drinks     Social History   Tobacco Use  Smoking Status Current Some Day Smoker  . Types: Cigarettes  Smokeless Tobacco Never Used     Health Maintenance  Topic Date Due  . COLONOSCOPY  03/02/2007  . FOOT EXAM  03/23/2018  . MAMMOGRAM  08/01/2018  . INFLUENZA VACCINE  10/27/2018  . OPHTHALMOLOGY EXAM  12/29/2018  . HEMOGLOBIN A1C  03/08/2019  . TETANUS/TDAP  06/26/2021  . PNEUMOCOCCAL POLYSACCHARIDE VACCINE AGE 73-64 HIGH RISK  Completed  . Hepatitis C Screening  Completed  . HIV Screening  Completed    Outpatient Encounter Medications as of 12/31/2018  Medication Sig  . amLODipine (NORVASC) 5 MG  tablet Take 1 tablet (5 mg total) by mouth daily.  Marland Kitchen atorvastatin (LIPITOR) 10 MG tablet Take 1 tablet (10 mg total) by mouth daily.  Marland Kitchen glimepiride (AMARYL) 2 MG tablet TAKE ONE TABLET BY MOUTH ONCE DAILY BEFORE  BREAKFAST  . ibuprofen (ADVIL,MOTRIN) 200 MG tablet Take 600 mg by mouth daily. Takes when she does not have the meloxicam  . losartan (COZAAR) 50 MG tablet Take 1 tablet (50 mg total) by mouth daily.  . meloxicam (MOBIC) 15 MG tablet Take 15 mg by mouth daily.  . metFORMIN (GLUCOPHAGE) 500 MG tablet Take 1 tablet (500 mg total) by mouth 2 (two) times daily with a meal. TAKE 1/2 TABLET BY MOUTH 2 TIMES A DAY  . methimazole (TAPAZOLE) 10 MG tablet Take 0.5 tablets (5 mg total) by mouth daily.  . metoprolol tartrate (LOPRESSOR) 25 MG tablet TAKE ONE TABLET BY MOUTH 2 TIMES A DAY  . pioglitazone (ACTOS) 15 MG tablet Take 1 tablet (15 mg total) by mouth daily.   No facility-administered encounter medications on file as of 12/31/2018.     Health Maintenance/Date Completed  Last ED visit: unknown Last Visit to PCP: 05/2018 Next Visit to PCP: no scheduled appointment Dental Exam: 01/14/2019 Eye Exam: 03/2018 Pelvic/PAP Exam: unknown Mammogram: planning to call to make an appointment DEXA: no Colonoscopy: few years ago (year unknown) Flu Vaccine: planning to get 2020 Pneumonia Vaccine: no  Assessment  Diabetes: glimepiride, metformin, pioglitazone Patient checks blood sugar at home with readings of 110 -120mg /dL. Patient has a high fasting reading of 140 mg/dL due to eating sweets the night before. Patient denies frequent readings that high. Patient stated she was taking the metformin 1 tablet once a day. A1c in June was 7.4% a decrease from 8.7% in January. Patient stated she has experienced low blood sugars.   Hypertension: amlodipine, losartan, metoprolol Patient is not sure if she is still taking the losartan. Patient is taking the metoprolol 1 tablet once a day. Blood pressure  in February was elevated at 160/51mmHg.   Hyperthyroidism: methimazole Patient denies issues with medication and hyperthyroidism.   Hyperlipidemia: atorvastatin Patient is scheduled to have blood work completed. A up to date lab work is needed to access the medication efficacy.   Hip pain: meloxicam Patient stated her hip continues to bother her. She is trying to lose weight to help with the amount of weight on her hip.   Adherence: Patient denies recently missing doses. She sated in the past she has missed one dose in a 2-week period.    Plan: Diabetes: Continue metformin, glimepiride, and pioglitazone. Continue to monitor blood sugar at home. Patient was educated on how to correct a low blood sugar.   Hypertension: Patient encouraged to contact doctor about directions of metoprolol. Patient doesn't believe she should be taking the metoprolol twice a day. Patient was contacted on 01/01/2019 to clarify with patient that she is taking losartan. Encouraged patient to check blood pressure at home. Continue all medications.   Hyperthyroidism: Continue methimazole 10 mg taking  tablet by mouth daily.   Hyperlipidemia: Continue atorvastatin.   Hip pain: Continue medication. Follow up with doctor is pain worsens.   Adherence: Continue to take medications according to direction.   Chaney Born, [facility]

## 2019-01-23 ENCOUNTER — Other Ambulatory Visit: Payer: Medicaid Other

## 2019-01-23 ENCOUNTER — Other Ambulatory Visit: Payer: Self-pay

## 2019-01-23 DIAGNOSIS — E119 Type 2 diabetes mellitus without complications: Secondary | ICD-10-CM

## 2019-01-23 DIAGNOSIS — IMO0001 Reserved for inherently not codable concepts without codable children: Secondary | ICD-10-CM

## 2019-01-26 LAB — COMPREHENSIVE METABOLIC PANEL
ALT: 11 IU/L (ref 0–32)
AST: 23 IU/L (ref 0–40)
Albumin/Globulin Ratio: 1.5 (ref 1.2–2.2)
Albumin: 4.4 g/dL (ref 3.8–4.8)
Alkaline Phosphatase: 97 IU/L (ref 39–117)
BUN/Creatinine Ratio: 19 (ref 12–28)
BUN: 15 mg/dL (ref 8–27)
Bilirubin Total: 0.4 mg/dL (ref 0.0–1.2)
CO2: 22 mmol/L (ref 20–29)
Calcium: 9.6 mg/dL (ref 8.7–10.3)
Chloride: 107 mmol/L — ABNORMAL HIGH (ref 96–106)
Creatinine, Ser: 0.81 mg/dL (ref 0.57–1.00)
GFR calc Af Amer: 91 mL/min/{1.73_m2} (ref >59)
GFR calc non Af Amer: 79 mL/min/{1.73_m2} (ref >59)
Globulin, Total: 2.9 g/dL (ref 1.5–4.5)
Glucose: 101 mg/dL — ABNORMAL HIGH (ref 65–99)
Potassium: 4 mmol/L (ref 3.5–5.2)
Sodium: 144 mmol/L (ref 134–144)
Total Protein: 7.3 g/dL (ref 6.0–8.5)

## 2019-01-26 LAB — UA/M W/RFLX CULTURE, ROUTINE
Bilirubin, UA: NEGATIVE
Glucose, UA: NEGATIVE
Ketones, UA: NEGATIVE
Nitrite, UA: NEGATIVE
Protein,UA: NEGATIVE
RBC, UA: NEGATIVE
Specific Gravity, UA: 1.016 (ref 1.005–1.030)
Urobilinogen, Ur: 0.2 mg/dL (ref 0.2–1.0)
pH, UA: 5.5 (ref 5.0–7.5)

## 2019-01-26 LAB — CBC
Hematocrit: 38.5 % (ref 34.0–46.6)
Hemoglobin: 12.3 g/dL (ref 11.1–15.9)
MCH: 28 pg (ref 26.6–33.0)
MCHC: 31.9 g/dL (ref 31.5–35.7)
MCV: 88 fL (ref 79–97)
Platelets: 173 10*3/uL (ref 150–450)
RBC: 4.39 x10E6/uL (ref 3.77–5.28)
RDW: 13.4 % (ref 11.7–15.4)
WBC: 8 10*3/uL (ref 3.4–10.8)

## 2019-01-26 LAB — MICROSCOPIC EXAMINATION: Casts: NONE SEEN /lpf

## 2019-01-26 LAB — URINE CULTURE, REFLEX

## 2019-01-26 LAB — HEMOGLOBIN A1C
Est. average glucose Bld gHb Est-mCnc: 120 mg/dL
Hgb A1c MFr Bld: 5.8 % — ABNORMAL HIGH (ref 4.8–5.6)

## 2019-01-26 LAB — TSH: TSH: 1.01 u[IU]/mL (ref 0.450–4.500)

## 2019-01-26 LAB — LIPID PANEL
Chol/HDL Ratio: 4.5 ratio — ABNORMAL HIGH (ref 0.0–4.4)
Cholesterol, Total: 171 mg/dL (ref 100–199)
HDL: 38 mg/dL — ABNORMAL LOW (ref >39)
LDL Chol Calc (NIH): 101 mg/dL — ABNORMAL HIGH (ref 0–99)
Triglycerides: 184 mg/dL — ABNORMAL HIGH (ref 0–149)
VLDL Cholesterol Cal: 32 mg/dL (ref 5–40)

## 2019-01-26 LAB — T4: T4, Total: 7.5 ug/dL (ref 4.5–12.0)

## 2019-02-06 ENCOUNTER — Ambulatory Visit: Payer: Self-pay | Admitting: Internal Medicine

## 2019-02-06 ENCOUNTER — Other Ambulatory Visit: Payer: Self-pay

## 2019-02-06 DIAGNOSIS — I10 Essential (primary) hypertension: Secondary | ICD-10-CM

## 2019-02-06 DIAGNOSIS — E0590 Thyrotoxicosis, unspecified without thyrotoxic crisis or storm: Secondary | ICD-10-CM

## 2019-02-06 DIAGNOSIS — M1612 Unilateral primary osteoarthritis, left hip: Secondary | ICD-10-CM

## 2019-02-06 DIAGNOSIS — Z0000 Encounter for general adult medical examination without abnormal findings: Secondary | ICD-10-CM

## 2019-02-06 DIAGNOSIS — E119 Type 2 diabetes mellitus without complications: Secondary | ICD-10-CM

## 2019-02-06 NOTE — Progress Notes (Signed)
 Subjective:    Patient ID: Sarah [name], female    DOB: 06/10/1955, 62 y.o.   MRN: [id number]  HPI  Patient is a 62 year old female presenting for a telephonic follow up appointment. Pt reports she is running low on metformin. Pt reports blood sugars in the 110s-120s, checking in the morning before breakfast.   Review of Systems Patient Active Problem List   Diagnosis Date Noted  . Ophthalmic manifestation of Graves disease 07/25/2018  . Finger wound, simple, open, subsequent encounter 12/14/2017  . Felon of finger of left hand 12/14/2017  . 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 Primary osteoarthritis of left hip 02/28/2017  . Primary osteoarthritis of left knee 02/28/2017  . Hyperlipidemia associated with type 2 diabetes mellitus (HCC) 03/05/2015  . Biceps tendinitis 08/10/2014  . Essential (primary) hypertension 08/10/2014  . Hyperthyroidism 08/10/2014  . Uncontrolled type 2 diabetes mellitus without complication, without long-term current use of insulin 08/10/2014    Allergies as of 02/06/2019      Reactions   Ace Inhibitors Nausea Only   Farxiga [dapagliflozin]    Vaginitis      Medication List       Accurate as of February 06, 2019  9:09 AM. If you have any questions, ask your nurse or doctor.        amLODipine 5 MG tablet Commonly known as: NORVASC Take 1 tablet (5 mg total) by mouth daily.   atorvastatin 10 MG tablet Commonly known as: LIPITOR Take 1 tablet (10 mg total) by mouth daily.   glimepiride 2 MG tablet Commonly known as: AMARYL TAKE ONE TABLET BY MOUTH ONCE DAILY BEFORE  BREAKFAST   ibuprofen 200 MG tablet Commonly known as: ADVIL Take 600 mg by mouth daily. Takes when she does not have the meloxicam   losartan 50 MG tablet Commonly known as: COZAAR Take 1 tablet (50 mg total) by mouth daily.   meloxicam 15 MG tablet Commonly known as: MOBIC Take 15 mg by mouth daily.   metFORMIN 500 MG tablet Commonly known  as: GLUCOPHAGE Take 1 tablet (500 mg total) by mouth 2 (two) times daily with a meal. TAKE 1/2 TABLET BY MOUTH 2 TIMES A DAY   methimazole 10 MG tablet Commonly known as: TAPAZOLE Take 0.5 tablets (5 mg total) by mouth daily.   metoprolol tartrate 25 MG tablet Commonly known as: LOPRESSOR TAKE ONE TABLET BY MOUTH 2 TIMES A DAY   pioglitazone 15 MG tablet Commonly known as: Actos Take 1 tablet (15 mg total) by mouth daily.          Objective:   Physical Exam  No PE - telephonic visit      Assessment & Plan:   1. Diabetes mellitus without complication (HCC) Pt seems to be doing good with her diet. A1c is 5.8. Pt does not report any low blood sugar symptoms. Currently checks sugars once a day before breakfast. Asked pt to alternate morning blood sugar checks with checks before supper. - CBC; Future - Urinalysis; Future - Urine Culture; Future - HgB A1c; Future - Comp Met (CMET); Future  2. Essential (primary) hypertension Pt was told by [facility] that blood pressure readings were good at lab appointment. Numbers not available for review at this time.  3. Hyperthyroidism Currently on medication for suppresion. Las t4 and tsh are at therapeutic range - TSH; Future  4. Primary osteoarthritis of left hip Conitues to be symptomatic. Last orthopedic eval showed significant   deterioration on MRI. Medication seems to be helping. She uses a cane for ambulation.  5. Preventative health care Facilitating mammogram eval and pelvic pap examination hopefully in the next few months.  - Ambulatory referral to Hematology   Next appointments for office and labs are in February.  No changes in medication suggested.  

## 2019-02-12 ENCOUNTER — Ambulatory Visit: Payer: Medicaid Other

## 2019-02-12 ENCOUNTER — Other Ambulatory Visit: Payer: Self-pay

## 2019-02-12 DIAGNOSIS — E119 Type 2 diabetes mellitus without complications: Secondary | ICD-10-CM

## 2019-02-12 NOTE — Progress Notes (Unsigned)
ASSESSMENT and PLAN:   Sarah [name] is a 63 y.o. female returning for follow-up of T2DM for 4-5 years and hyperthyroidism complicated by hypertension, tobacco use, and physical inactivity last seen by the [facility] on Aug 14, 2018. Recently seen by PCP, who reported patient felt well and was told by [facility] that BP were in therapeutic range. Most recent labs indicate an improvement in T2DM control and TSH are within therapeutic range.   Continue with medications as prescribed with the exception of glimepiride to avoid any hypoglycemic episodes. Refill metformin.  Follow-up with [facility] about close monitoring of BP during COVID times of low-patient contact.  T2DM Her most recent Hb1Ac is 5.8% (01/23/2019) and is currently managing T2DM with 541m metformin BID, 245mof glimeperide QD, pioglitazone 15 mg QD. She reports checking blood sugars BID -- once in the morning before eating -- with reported blood sugars in the range of 100-115. Sometimes checks in evening. Right after eating banana this morning was 200.  Started pioglitazone 1541mD at May appointment as patient was resistant to shots. Denies weight gain. Patient reported an "infection" from a previous try with jardiance and was resistant to that option as well.  Followed up on counseling for starting Victoza but patient resistance to shots. Reported diet changes and exercises as the reasons for A1c decrease. Reports that meloxicam is "helping" with the hip pain as well as the ibuprofen if run out of meloxicam.  Encouraged cessation of soda-use and exercise as tolerated. Denies missing doses.   Hyperthroidism  In May, we asked her to get her blood drawn soon as her TSH may continue to be elevated as in her previous visit with the [facility], she was instructed to decrease her methimazole dose from 37m55m 5mg 45mer TSH of 9.270 (04/18/2018).   Hy[facility]ension [facility] BP needs to be monitored closely.    SUBJECTIVE HPI T2DM Denies symptoms of hypoglycemia, gastroparesis, polydipsia, polyuria, urinary incontinence and neuropathy. Currently does not endorse much exercise due to hip pain managed by meloxicam. Was scheduled for a hip replacement after dental work was completed. Endorses some soda use (1-can/day) and typically does not eat breakfast but will have a sandwich/carbs for lunch while dinner is varied.   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 30 tablet 11   atorvastatin (LIPITOR) 10 MG tablet Take 1 tablet (10 mg total) by mouth daily. 90 tablet 3   glimepiride (AMARYL) 2 MG tablet TAKE ONE TABLET BY MOUTH ONCE DAILY BEFORE  BREAKFAST 90 tablet 3   ibuprofen (ADVIL,MOTRIN) 200 MG tablet Take 600 mg by mouth daily. Takes when she does not have the meloxicam  losartan (COZAAR) 50 MG tablet Take 1 tablet (50 mg total) by mouth daily. 90 tablet 3   meloxicam (MOBIC) 15 MG tablet Take 15 mg by mouth daily.     metFORMIN (GLUCOPHAGE) 500 MG tablet Take 1 tablet (500 mg total) by mouth 2 (two) times daily with a meal.  TAKE 1/2 TABLET BY MOUTH 2 TIMES A DAY 180 tablet 4   methimazole (TAPAZOLE) 10 MG tablet Take 0.5 tablets (5 mg total) by mouth daily. 90 tablet 0   metoprolol tartrate (LOPRESSOR) 25 MG tablet TAKE ONE TABLET BY MOUTH 2 TIMES A DAY 180 tablet 1   pioglitazone (ACTOS) 15 MG tablet Take 1 tablet (15 mg total) by mouth daily. 90 tablet 4   No current facility-administered medications for this visit.      Recent labs:  Results for orders placed or performed in visit on 01/23/19  Microscopic Examination   BLD  Result Value Ref Range   WBC, UA 11-30 (A) 0 - 5 /hpf   RBC 0-2 0 - 2 /hpf   Epithelial Cells (non renal) 0-10 0 - 10 /hpf   Casts None seen None seen /lpf   Mucus, UA Present Not Estab.   Bacteria, UA Few None seen/Few  Urine Culture, Reflex   BLD  Result Value Ref Range   Urine Culture, Routine Final report    Organism ID, Bacteria Comment   Lipid Profile  Result Value Ref Range   Cholesterol, Total 171 100 - 199 mg/dL   Triglycerides 184 (H) 0 - 149 mg/dL   HDL 38 (L) >39 mg/dL   VLDL Cholesterol Cal 32 5 - 40 mg/dL   LDL Chol Calc (NIH) 101 (H) 0 - 99 mg/dL   Chol/HDL Ratio 4.5 (H) 0.0 - 4.4 ratio  T4  Result Value Ref Range   T4, Total 7.5 4.5 - 12.0 ug/dL  TSH  Result Value Ref Range   TSH 1.010 0.450 - 4.500 uIU/mL  UA/M w/rflx Culture, Routine   Specimen: Blood   BLD  Result Value Ref Range   Specific Gravity, UA 1.016 1.005 - 1.030   pH, UA 5.5 5.0 - 7.5   Color, UA Yellow Yellow   Appearance Ur Clear Clear   Leukocytes,UA 2+ (A) Negative   Protein,UA Negative Negative/Trace   Glucose, UA Negative Negative   Ketones, UA Negative Negative   RBC, UA Negative Negative   Bilirubin, UA Negative Negative   Urobilinogen, Ur 0.2 0.2 - 1.0 mg/dL   Nitrite, UA Negative Negative   Microscopic Examination See below:    Urinalysis Reflex Comment   CBC  Result Value Ref Range   WBC 8.0 3.4 - 10.8 x10E3/uL   RBC 4.39 3.77 - 5.28 x10E6/uL   Hemoglobin 12.3  11.1 - 15.9 g/dL   Hematocrit 38.5 34.0 - 46.6 %   MCV 88 79 - 97 fL   MCH 28.0 26.6 - 33.0 pg   MCHC 31.9 31.5 - 35.7 g/dL   RDW 13.4 11.7 - 15.4 %   Platelets 173 150 - 450 x10E3/uL  Comp Met (CMET)  Result Value Ref Range   Glucose 101 (H) 65 - 99 mg/dL   BUN 15 8 - 27 mg/dL   Creatinine, Ser 0.81 0.57 - 1.00 mg/dL   GFR calc non Af Amer 79 >59 mL/min/1.73   GFR calc Af Amer 91 >59 mL/min/1.73   BUN/Creatinine Ratio 19 12 - 28   Sodium 144 134 - 144 mmol/L   Potassium 4.0  3.5 - 5.2 mmol/L   Chloride 107 (H) 96 - 106 mmol/L   CO2 22 20 - 29 mmol/L   Calcium 9.6 8.7 - 10.3 mg/dL   Total Protein 7.3 6.0 - 8.5 g/dL   Albumin 4.4 3.8 - 4.8 g/dL   Globulin, Total 2.9 1.5 - 4.5 g/dL   Albumin/Globulin Ratio 1.5 1.2 - 2.2   Bilirubin Total 0.4 0.0 - 1.2 mg/dL   Alkaline Phosphatase 97 39 - 117 IU/L   AST 23 0 - 40 IU/L   ALT 11 0 - 32 IU/L  HgB A1c  Result Value Ref Range   Hgb A1c MFr Bld 5.8 (H) 4.8 - 5.6 %   Est. average glucose Bld gHb Est-mCnc 120 mg/dL

## 2019-03-05 ENCOUNTER — Other Ambulatory Visit: Payer: Self-pay | Admitting: Internal Medicine

## 2019-03-08 ENCOUNTER — Encounter: Payer: Self-pay | Admitting: *Deleted

## 2019-03-08 NOTE — Progress Notes (Signed)
Tried to call patient to prescreen for her B[facility]CP appointment next week.  No answer and no voicemail.

## 2019-03-11 NOTE — Progress Notes (Signed)
Patient prescreened for BCCCP due to COVID 19 using two patient identifiers.  Verbal Consent given.  She is to arive at [facility] at 10:00. 03/12/2019.  Orders in.  Risk Assessment    Risk Scores      03/12/2019   Last edited by: Theodore Demark, RN   5-year risk: 1.5 %   Lifetime risk: 6.5 %

## 2019-03-12 ENCOUNTER — Other Ambulatory Visit: Payer: Self-pay

## 2019-03-12 ENCOUNTER — Ambulatory Visit
Admission: RE | Admit: 2019-03-12 | Discharge: 2019-03-12 | Disposition: A | Discharge status: Home / Self Care | Payer: Medicaid Other | Source: Ambulatory Visit | Attending: Oncology | Admitting: Oncology

## 2019-03-12 ENCOUNTER — Ambulatory Visit: Payer: Medicaid Other | Attending: Oncology

## 2019-03-12 DIAGNOSIS — Z0000 Encounter for general adult medical examination without abnormal findings: Secondary | ICD-10-CM | POA: Insufficient documentation

## 2019-03-12 NOTE — Progress Notes (Signed)
Letter mailed from [facility] to notify of normal mammogram results.  Patient to return in one year for annual screening.  Copy to [facility]. 

## 2019-03-25 ENCOUNTER — Telehealth: Payer: Self-pay | Admitting: Pharmacy Technician

## 2019-03-25 NOTE — Telephone Encounter (Signed)
Patient eligible for Medicare 08/27/19.  Patient verbally acknowledged that she understood that [facility] would not be able to provide medication assistance after 08/26/19.    [facility]

## 2019-04-03 ENCOUNTER — Ambulatory Visit: Payer: Medicaid Other

## 2019-04-03 DIAGNOSIS — Z79899 Other long term (current) drug therapy: Secondary | ICD-10-CM

## 2019-04-04 ENCOUNTER — Other Ambulatory Visit: Payer: Self-pay

## 2019-04-04 NOTE — Progress Notes (Signed)
Medication Management Clinic Visit Note  Patient: Sarah [name] MRN: [id number] Date of Birth: Jan 04, 1956 PCP: Sarah Millers, MD   Sarah [name] 64 y.o. female presents for a medication therapy management visit with the pharmacist today. Un-able to reach patient on 04/03/2019 scheduled appointment so visit date is 04/04/2019. Patient identified using two patient identifiers.   There were no vitals taken for this visit.  Patient Information   Past Medical History:  Diagnosis Date  . Diabetes mellitus without complication ([place])   . Hyperlipidemia   . Hypertension   . Thyroid disease       Past Surgical History:  Procedure Laterality Date  . ABDOMINAL HYSTERECTOMY    . AMPUTATION Left 12/15/2017   Procedure: AMPUTATION DIGIT left long finger;  Surgeon: Dereck Leep, MD;  Location: [facility] ORS;  Service: Orthopedics;  Laterality: Left;  . PARTIAL HYSTERECTOMY       Family History  Problem Relation Age of Onset  . Diabetes Mother   . Diabetes Sister   . Stroke Sister   . Breast cancer Neg Hx     New Diagnoses (since last visit): No  Family Support: Good  Lifestyle Diet: Breakfast: Toast, eggs Lunch: Sandwich Dinner: Cabbage, steak Drinks: Water, orange juice, soda, low fat milk Desserts: Sweet potato pie Fast food: every other day  Social History   Substance and Sexual Activity  Alcohol Use No  . Alcohol/week: 0.0 standard drinks   Denies alcohol consumption   Social History   Tobacco Use  Smoking Status Current Some Day Smoker  . Types: Cigarettes  Smokeless Tobacco Never Used   Denies cigarette smoking. Quit a couple of months ago.   Health Maintenance  Topic Date Due  . COLONOSCOPY  03/02/2007  . FOOT EXAM  03/23/2018  . INFLUENZA VACCINE  10/27/2018  . OPHTHALMOLOGY EXAM  12/29/2018  . HEMOGLOBIN A1C  07/24/2019  . MAMMOGRAM  03/11/2020  . TETANUS/TDAP  06/26/2021  . PNEUMOCOCCAL POLYSACCHARIDE VACCINE AGE 33-64 HIGH RISK   Completed  . Hepatitis C Screening  Completed  . HIV Screening  Completed   Outpatient Encounter Medications as of 04/03/2019  Medication Sig  . amLODipine (NORVASC) 5 MG tablet Take 1 tablet (5 mg total) by mouth daily.  Marland Kitchen atorvastatin (LIPITOR) 10 MG tablet Take 1 tablet (10 mg total) by mouth daily.  Marland Kitchen ibuprofen (ADVIL,MOTRIN) 200 MG tablet Take 600 mg by mouth daily. Takes when she does not have the meloxicam  . losartan (COZAAR) 50 MG tablet Take 1 tablet (50 mg total) by mouth daily.  . meloxicam (MOBIC) 15 MG tablet TAKE ONE TABLET BY MOUTH EVERY DAY  . metFORMIN (GLUCOPHAGE) 500 MG tablet Take 1 tablet (500 mg total) by mouth 2 (two) times daily with a meal. TAKE 1/2 TABLET BY MOUTH 2 TIMES A DAY  . methimazole (TAPAZOLE) 10 MG tablet Take 0.5 tablets (5 mg total) by mouth daily.  . metoprolol tartrate (LOPRESSOR) 25 MG tablet TAKE ONE TABLET BY MOUTH 2 TIMES A DAY  . pioglitazone (ACTOS) 15 MG tablet Take 1 tablet (15 mg total) by mouth daily.  Marland Kitchen glimepiride (AMARYL) 2 MG tablet TAKE ONE TABLET BY MOUTH ONCE DAILY BEFORE  BREAKFAST (Patient not taking: Reported on 04/04/2019)   No facility-administered encounter medications on file as of 04/03/2019.    Health Maintenance/Date Completed  Last ED visit: No recent visits to the ED Last Visit to PCP: Couple of months ago (Dr. Andrey Farmer) Next Visit to PCP: Reports  upcoming appointment in 04/2019 Specialist Visit: Denies Dental Exam: Endorses upcoming dentist appointment 1/13.  Eye Exam: Wears glasses. Endorses last eye exam was in 05/2018 Pelvic/PAP Exam: History of hysterectomy Mammogram: Endorses last mammogram was in 02/2019 DEXA: Never.  Colonoscopy: Reports last was a few years ago Flu Vaccine: Endorses receipt Pneumonia Vaccine: 01/26/2013  Assessment and Plan:  1. Type-2 Diabetes Mellitus, controlled -Last Hgb A1C in chart was 5.8 on 01/23/2019 which had decreased from 7.4 on 09/06/2018 -Antihyperglycemic regimen includes  metformin 500 mg twice daily with food and pioglitazone 15 mg daily -Endorses checking blood sugar every day. Un-clear what it is currently running. -Patient is on a moderate intensity statin, atorvastatin 10 mg for ASCVD prevention -Un-able to find albumin creatinine ratio in chart. Patient is on losartan 50 mg daily -Encouraged patient to be mindful of sugar content of foods and to avoid refined-carbohydrates.   2. Hypertension -Antihypertensive regimen includes losartan 50 mg daily, amlodipine 5 mg daily, and metoprolol tartrate 25 mg daily. Rx for metoprolol is for 25 mg BID but patient says she is un-able to tolerate it more than once per day -She does not check her blood pressure at home. Does not have a blood pressure monitoring device. Advised her to check it when she is at [facility] or other store with blood pressure monitoring machine. -Endorses cutting salt out of her diet. Advised her to be mindful of sodium content of processed foods and fast food.  3. Hyperlipidemia -Atorvastatin 10 mg daily -Last lipid panel 01/23/2019 with LDL 101, HDL 38  4. Graves disease -Methimazole 5 mg daily -Last TSH 1.010 and T4 7.5 on 01/23/2019 -Methimazole was decreased from 10 mg daily to 5 mg daily after TSH of 9.270 on 04/18/2018  5. Hip pain -Meloxicam 15 mg daily and ibuprofen 600 mg PRN breakthrough pain -Scr stable -Tylenol may be a better choice considering co-morbid hypertension  6. Adherence -Patient endorses taking her medications every day -She takes medications from the bottle and manages medications herself -Not requesting refills on any medications at this time  [facility] Resident 04 April 2019

## 2019-04-22 ENCOUNTER — Telehealth: Payer: Self-pay | Admitting: Pharmacy Technician

## 2019-04-22 NOTE — Telephone Encounter (Signed)
Patient approved till Medicare becomes effective on 08/27/19.  [name]

## 2019-05-08 ENCOUNTER — Other Ambulatory Visit: Payer: Medicaid Other

## 2019-05-08 ENCOUNTER — Other Ambulatory Visit: Payer: Self-pay

## 2019-05-08 DIAGNOSIS — E0590 Thyrotoxicosis, unspecified without thyrotoxic crisis or storm: Secondary | ICD-10-CM

## 2019-05-08 DIAGNOSIS — E119 Type 2 diabetes mellitus without complications: Secondary | ICD-10-CM

## 2019-05-10 ENCOUNTER — Other Ambulatory Visit: Payer: Self-pay | Admitting: Internal Medicine

## 2019-05-10 LAB — URINALYSIS
Bilirubin, UA: NEGATIVE
Glucose, UA: NEGATIVE
Ketones, UA: NEGATIVE
Nitrite, UA: NEGATIVE
Protein,UA: NEGATIVE
RBC, UA: NEGATIVE
Specific Gravity, UA: 1.018 (ref 1.005–1.030)
Urobilinogen, Ur: 0.2 mg/dL (ref 0.2–1.0)
pH, UA: 5 (ref 5.0–7.5)

## 2019-05-10 LAB — CBC
Hematocrit: 36.8 % (ref 34.0–46.6)
Hemoglobin: 12.5 g/dL (ref 11.1–15.9)
MCH: 29.1 pg (ref 26.6–33.0)
MCHC: 34 g/dL (ref 31.5–35.7)
MCV: 86 fL (ref 79–97)
Platelets: 167 10*3/uL (ref 150–450)
RBC: 4.3 x10E6/uL (ref 3.77–5.28)
RDW: 12.9 % (ref 11.7–15.4)
WBC: 8.4 10*3/uL (ref 3.4–10.8)

## 2019-05-10 LAB — COMPREHENSIVE METABOLIC PANEL
ALT: 13 IU/L (ref 0–32)
AST: 14 IU/L (ref 0–40)
Albumin/Globulin Ratio: 1.3 (ref 1.2–2.2)
Albumin: 4.3 g/dL (ref 3.8–4.8)
Alkaline Phosphatase: 124 IU/L — ABNORMAL HIGH (ref 39–117)
BUN/Creatinine Ratio: 17 (ref 12–28)
BUN: 13 mg/dL (ref 8–27)
Bilirubin Total: 0.4 mg/dL (ref 0.0–1.2)
CO2: 24 mmol/L (ref 20–29)
Calcium: 9.5 mg/dL (ref 8.7–10.3)
Chloride: 107 mmol/L — ABNORMAL HIGH (ref 96–106)
Creatinine, Ser: 0.77 mg/dL (ref 0.57–1.00)
GFR calc Af Amer: 96 mL/min/{1.73_m2} (ref >59)
GFR calc non Af Amer: 83 mL/min/{1.73_m2} (ref >59)
Globulin, Total: 3.2 g/dL (ref 1.5–4.5)
Glucose: 199 mg/dL — ABNORMAL HIGH (ref 65–99)
Potassium: 3.9 mmol/L (ref 3.5–5.2)
Sodium: 145 mmol/L — ABNORMAL HIGH (ref 134–144)
Total Protein: 7.5 g/dL (ref 6.0–8.5)

## 2019-05-10 LAB — TSH: TSH: 0.013 u[IU]/mL — ABNORMAL LOW (ref 0.450–4.500)

## 2019-05-10 LAB — HEMOGLOBIN A1C
Est. average glucose Bld gHb Est-mCnc: 186 mg/dL
Hgb A1c MFr Bld: 8.1 % — ABNORMAL HIGH (ref 4.8–5.6)

## 2019-05-10 LAB — URINE CULTURE: Organism ID, Bacteria: NO GROWTH

## 2019-05-15 ENCOUNTER — Ambulatory Visit: Payer: Self-pay | Admitting: Internal Medicine

## 2019-05-21 ENCOUNTER — Other Ambulatory Visit: Payer: Self-pay

## 2019-05-21 MED ORDER — LOSARTAN POTASSIUM 50 MG PO TABS: 50.0000 mg | ORAL_TABLET | Freq: Every day | ORAL | 0 refills | Status: DC

## 2019-05-22 ENCOUNTER — Other Ambulatory Visit: Payer: Self-pay

## 2019-05-22 ENCOUNTER — Ambulatory Visit: Payer: Self-pay | Admitting: Internal Medicine

## 2019-05-22 VITALS — BP 150/75 | HR 74 | Temp 96.8°F | Ht 63.0 in | Wt 201.7 lb

## 2019-05-22 DIAGNOSIS — M1612 Unilateral primary osteoarthritis, left hip: Secondary | ICD-10-CM

## 2019-05-22 DIAGNOSIS — E0590 Thyrotoxicosis, unspecified without thyrotoxic crisis or storm: Secondary | ICD-10-CM

## 2019-05-22 DIAGNOSIS — E119 Type 2 diabetes mellitus without complications: Secondary | ICD-10-CM

## 2019-05-22 DIAGNOSIS — I10 Essential (primary) hypertension: Secondary | ICD-10-CM

## 2019-05-22 MED ORDER — METFORMIN HCL 500 MG PO TABS: 750.0000 mg | ORAL_TABLET | Freq: Two times a day (BID) | ORAL | 3 refills | Status: DC

## 2019-05-22 NOTE — Progress Notes (Signed)
Subjective:    Patient ID: Sarah [name], female    DOB: [birth year], 64 y.o.   MRN: [id number]  HPI  Patient is a 64 year old female presenting for a telephonic follow-up of diabetes. Patient reports she is feeling well.   Review of Systems Patient Active Problem List   Diagnosis Date Noted  . Ophthalmic manifestation of Graves disease 07/25/2018  . Finger wound, simple, open, subsequent encounter 12/14/2017  . Felon of finger of left hand 12/14/2017  . Osteomyelitis of finger of left hand ([place]) 12/14/2017  . Primary osteoarthritis of left hip 02/28/2017  . Primary osteoarthritis of left knee 02/28/2017  . Hyperlipidemia associated with type 2 diabetes mellitus ([place]) 03/05/2015  . Biceps tendinitis 08/10/2014  . Essential (primary) hypertension 08/10/2014  . Hyperthyroidism 08/10/2014  . Uncontrolled type 2 diabetes mellitus without complication, without long-term current use of insulin 08/10/2014   Allergies as of 05/22/2019      Reactions   Ace Inhibitors Nausea Only   Farxiga [dapagliflozin]    Vaginitis      Medication List       Accurate as of May 22, 2019  9:30 AM. If you have any questions, ask your nurse or doctor.        STOP taking these medications   glimepiride 2 MG tablet Commonly known as: AMARYL Stopped by: Tawni Millers, MD     TAKE these medications   amLODipine 5 MG tablet Commonly known as: NORVASC Take 1 tablet (5 mg total) by mouth daily.   atorvastatin 10 MG tablet Commonly known as: LIPITOR Take 1 tablet (10 mg total) by mouth daily.   ibuprofen 200 MG tablet Commonly known as: ADVIL Take 600 mg by mouth daily. Takes when she does not have the meloxicam   losartan 50 MG tablet Commonly known as: COZAAR Take 1 tablet (50 mg total) by mouth daily.   meloxicam 15 MG tablet Commonly known as: MOBIC TAKE ONE TABLET BY MOUTH EVERY DAY   metFORMIN 500 MG tablet Commonly known as: GLUCOPHAGE Take 1.5 tablets (750 mg total)  by mouth 2 (two) times daily with a meal. What changed:   how much to take  additional instructions Changed by: Tawni Millers, MD   methimazole 10 MG tablet Commonly known as: TAPAZOLE Take 0.5 tablets (5 mg total) by mouth daily.   metoprolol tartrate 25 MG tablet Commonly known as: LOPRESSOR TAKE ONE TABLET BY MOUTH 2 TIMES A DAY   pioglitazone 15 MG tablet Commonly known as: Actos Take 1 tablet (15 mg total) by mouth daily.          Objective:   Physical Exam  No PE - telephonic visit     Assessment & Plan:  1. Essential (primary) hypertension Self monitored BP daily. Systolic still running mildly elevated in the 150s, similar to our recent office recording. No medication changes at this time. Stressed the importance of weight reduction.  - Basic Metabolic Panel (BMET); Future - Lipid Profile; Future  2. Diabetes mellitus without complication (HCC) 123456 up to 8.1 from previous reading. Endocrinologist discontinues glimepiride and increased the metformin to 750mg  twice a day. In reviewing medication, pt has only been taking 500 mg twice a day. Plan is to get her on the 750 mg twice a day.  - HgB A1c; Future - metFORMIN (GLUCOPHAGE) 500 MG tablet; Take 1.5 tablets (750 mg total) by mouth 2 (two) times daily with a meal.  Dispense: 270 tablet; Refill: 3  3. Hyperthyroidism Pt appears to be euthyroid but her TSH is dropping. No change in her medication at this time. Plan to recheck labs in 90 days.  - T4 AND TSH; Future  4. Primary osteoarthritis of left hip Arthritis seems to be compensated with her current medication  Plan to recheck pt in 90 days with scheduled labs one week prior.

## 2019-07-15 ENCOUNTER — Other Ambulatory Visit: Payer: Self-pay | Admitting: Internal Medicine

## 2019-07-16 ENCOUNTER — Other Ambulatory Visit: Payer: Self-pay

## 2019-07-16 DIAGNOSIS — E119 Type 2 diabetes mellitus without complications: Secondary | ICD-10-CM

## 2019-07-16 MED ORDER — METFORMIN HCL 500 MG PO TABS: 750.0000 mg | ORAL_TABLET | Freq: Two times a day (BID) | ORAL | 2 refills | Status: DC

## 2019-08-14 ENCOUNTER — Other Ambulatory Visit: Payer: Self-pay

## 2019-08-14 ENCOUNTER — Other Ambulatory Visit: Payer: Medicaid Other

## 2019-08-14 ENCOUNTER — Other Ambulatory Visit: Payer: Self-pay | Admitting: Internal Medicine

## 2019-08-14 DIAGNOSIS — I10 Essential (primary) hypertension: Secondary | ICD-10-CM

## 2019-08-14 DIAGNOSIS — E0500 Thyrotoxicosis with diffuse goiter without thyrotoxic crisis or storm: Secondary | ICD-10-CM

## 2019-08-14 DIAGNOSIS — E0590 Thyrotoxicosis, unspecified without thyrotoxic crisis or storm: Secondary | ICD-10-CM

## 2019-08-14 DIAGNOSIS — E119 Type 2 diabetes mellitus without complications: Secondary | ICD-10-CM

## 2019-08-15 ENCOUNTER — Other Ambulatory Visit: Payer: Self-pay | Admitting: Internal Medicine

## 2019-08-15 ENCOUNTER — Other Ambulatory Visit: Payer: Self-pay | Admitting: Gerontology

## 2019-08-15 LAB — HEMOGLOBIN A1C
Est. average glucose Bld gHb Est-mCnc: 169 mg/dL
Hgb A1c MFr Bld: 7.5 % — ABNORMAL HIGH (ref 4.8–5.6)

## 2019-08-15 LAB — BASIC METABOLIC PANEL
BUN/Creatinine Ratio: 25 (ref 12–28)
BUN: 18 mg/dL (ref 8–27)
CO2: 22 mmol/L (ref 20–29)
Calcium: 9.9 mg/dL (ref 8.7–10.3)
Chloride: 106 mmol/L (ref 96–106)
Creatinine, Ser: 0.72 mg/dL (ref 0.57–1.00)
GFR calc Af Amer: 104 mL/min/{1.73_m2} (ref >59)
GFR calc non Af Amer: 90 mL/min/{1.73_m2} (ref >59)
Glucose: 174 mg/dL — ABNORMAL HIGH (ref 65–99)
Potassium: 4.2 mmol/L (ref 3.5–5.2)
Sodium: 144 mmol/L (ref 134–144)

## 2019-08-15 LAB — T4 AND TSH
T4, Total: 10.9 ug/dL (ref 4.5–12.0)
TSH: <0.005 u[IU]/mL — ABNORMAL LOW (ref 0.450–4.500)

## 2019-08-15 LAB — LIPID PANEL
Chol/HDL Ratio: 4.3 ratio (ref 0.0–4.4)
Cholesterol, Total: 142 mg/dL (ref 100–199)
HDL: 33 mg/dL — ABNORMAL LOW (ref >39)
LDL Chol Calc (NIH): 80 mg/dL (ref 0–99)
Triglycerides: 165 mg/dL — ABNORMAL HIGH (ref 0–149)
VLDL Cholesterol Cal: 29 mg/dL (ref 5–40)

## 2019-08-19 ENCOUNTER — Other Ambulatory Visit: Payer: Self-pay | Admitting: Gerontology

## 2019-08-20 ENCOUNTER — Other Ambulatory Visit: Payer: Self-pay | Admitting: Internal Medicine

## 2019-08-21 ENCOUNTER — Ambulatory Visit: Payer: Medicaid Other | Admitting: Internal Medicine

## 2019-08-21 ENCOUNTER — Encounter: Payer: Self-pay | Admitting: Internal Medicine

## 2019-08-21 ENCOUNTER — Other Ambulatory Visit: Payer: Self-pay

## 2019-08-21 DIAGNOSIS — E0500 Thyrotoxicosis with diffuse goiter without thyrotoxic crisis or storm: Secondary | ICD-10-CM

## 2019-08-21 DIAGNOSIS — E119 Type 2 diabetes mellitus without complications: Secondary | ICD-10-CM

## 2019-08-21 DIAGNOSIS — E1169 Type 2 diabetes mellitus with other specified complication: Secondary | ICD-10-CM

## 2019-08-21 DIAGNOSIS — I10 Essential (primary) hypertension: Secondary | ICD-10-CM

## 2019-08-21 MED ORDER — METHIMAZOLE 10 MG PO TABS: 5.0000 mg | ORAL_TABLET | Freq: Every day | ORAL | 0 refills | Status: DC

## 2019-08-21 MED ORDER — METOPROLOL TARTRATE 25 MG PO TABS: ORAL_TABLET | ORAL | 0 refills | Status: DC

## 2019-08-21 MED ORDER — AMLODIPINE BESYLATE 5 MG PO TABS: 5.0000 mg | ORAL_TABLET | Freq: Every day | ORAL | 0 refills | Status: DC

## 2019-08-21 MED ORDER — METFORMIN HCL 500 MG PO TABS: 750.0000 mg | ORAL_TABLET | Freq: Two times a day (BID) | ORAL | 2 refills | Status: DC

## 2019-08-21 MED ORDER — MELOXICAM 15 MG PO TABS: 15.0000 mg | ORAL_TABLET | Freq: Every day | ORAL | 0 refills | Status: DC

## 2019-08-21 NOTE — Progress Notes (Signed)
Subjective:    Patient ID: Sarah [name], female    DOB: 22-Apr-1955, 64 y.o.   MRN: [id number]  HPI  Patient is a 64 year old female presenting for a telephonic follow-up of diabetes.   Patient reports she is doing well and will be securing insurance in June and is looking for a new provider.  Pt reports the goiter on her neck has gone down.  Pt reports she need refills on losartan, metformin, and amlodipine.   Review of Systems Patient Active Problem List   Diagnosis Date Noted  . Ophthalmic manifestation of Graves disease 07/25/2018  . Finger wound, simple, open, subsequent encounter 12/14/2017  . Felon of finger of left hand 12/14/2017  . Osteomyelitis of finger of left hand ([place]) 12/14/2017  . Primary osteoarthritis of left hip 02/28/2017  . Primary osteoarthritis of left knee 02/28/2017  . Hyperlipidemia associated with type 2 diabetes mellitus ([place]) 03/05/2015  . Biceps tendinitis 08/10/2014  . Essential (primary) hypertension 08/10/2014  . Hyperthyroidism 08/10/2014  . Uncontrolled type 2 diabetes mellitus without complication, without long-term current use of insulin 08/10/2014   Allergies as of 08/21/2019      Reactions   Ace Inhibitors Nausea Only   Farxiga [dapagliflozin]    Vaginitis      Medication List       Accurate as of Aug 21, 2019  9:15 AM. If you have any questions, ask your nurse or doctor.        amLODipine 5 MG tablet Commonly known as: NORVASC Take 1 tablet (5 mg total) by mouth daily.   atorvastatin 10 MG tablet Commonly known as: LIPITOR TAKE ONE TABLET BY MOUTH EVERY DAY   ibuprofen 200 MG tablet Commonly known as: ADVIL Take 600 mg by mouth daily. Takes when she does not have the meloxicam   losartan 50 MG tablet Commonly known as: COZAAR Take 1 tablet (50 mg total) by mouth daily.   meloxicam 15 MG tablet Commonly known as: MOBIC Take 1 tablet (15 mg total) by mouth daily.   metFORMIN 500 MG tablet Commonly known  as: GLUCOPHAGE Take 1.5 tablets (750 mg total) by mouth 2 (two) times daily with a meal.   methimazole 10 MG tablet Commonly known as: TAPAZOLE Take 0.5 tablets (5 mg total) by mouth daily.   metoprolol tartrate 25 MG tablet Commonly known as: LOPRESSOR TAKE ONE TABLET BY MOUTH 2 TIMES A DAY   pioglitazone 15 MG tablet Commonly known as: ACTOS TAKE ONE TABLET BY MOUTH EVERY DAY          Objective:   Physical Exam  No PE - telephonic visit.      Assessment & Plan:  1. Essential (primary) hypertension Seems to be doing well with her readings at 145/79 at her last visit at the clinic. Systolic BP not at target yet. No changed in medications at this time. - metoprolol tartrate (LOPRESSOR) 25 MG tablet; TAKE ONE TABLET BY MOUTH 2 TIMES A DAY  Dispense: 180 tablet; Refill: 0  2. Diabetes mellitus without complication ([place]) Seems to be doing well. A1c is 7.5 at this visit.  - metFORMIN (GLUCOPHAGE) 500 MG tablet; Take 1.5 tablets (750 mg total) by mouth 2 (two) times daily with a meal.  Dispense: 90 tablet; Refill: 2  3. Goiter with hyperthyroidism Pt has been on Tapazole for some period of time. TSH is exceptionally low and has been dropping over the last few months.T4 is still therapuetic. Pt would probably benefit  form new thyroid uptake and scan, and adjustment to her Tapazole.  - methimazole (TAPAZOLE) 10 MG tablet; Take 0.5 tablets (5 mg total) by mouth daily.  Dispense: 45 tablet; Refill: 0  4. Hyperlipidemia associated with type 2 diabetes mellitus ([place]) Laps appear to be stable on current Lipitor.   Pt is transferring back to her previous PCP now that she qualifies for Medicare.   Medications have been renewed. Pt will work to be re-established with her previous PCP.

## 2019-09-02 ENCOUNTER — Other Ambulatory Visit: Payer: Self-pay | Admitting: Internal Medicine

## 2019-09-11 ENCOUNTER — Encounter: Payer: Self-pay | Admitting: Internal Medicine

## 2019-09-11 ENCOUNTER — Other Ambulatory Visit: Payer: Self-pay

## 2019-09-11 ENCOUNTER — Ambulatory Visit: Payer: Medicaid Other | Admitting: Internal Medicine

## 2019-09-11 DIAGNOSIS — E119 Type 2 diabetes mellitus without complications: Secondary | ICD-10-CM

## 2019-09-11 DIAGNOSIS — I10 Essential (primary) hypertension: Secondary | ICD-10-CM

## 2019-09-11 DIAGNOSIS — E0500 Thyrotoxicosis with diffuse goiter without thyrotoxic crisis or storm: Secondary | ICD-10-CM

## 2019-09-11 MED ORDER — METFORMIN HCL 500 MG PO TABS: 750.0000 mg | ORAL_TABLET | Freq: Two times a day (BID) | ORAL | 2 refills | Status: DC

## 2019-09-11 MED ORDER — LOSARTAN POTASSIUM 50 MG PO TABS: 50.0000 mg | ORAL_TABLET | Freq: Every day | ORAL | 0 refills | Status: DC

## 2019-09-11 MED ORDER — METHIMAZOLE 10 MG PO TABS: 5.0000 mg | ORAL_TABLET | Freq: Every day | ORAL | 0 refills | Status: DC

## 2019-09-11 MED ORDER — METOPROLOL TARTRATE 25 MG PO TABS: ORAL_TABLET | ORAL | 0 refills | Status: DC

## 2019-09-11 MED ORDER — AMLODIPINE BESYLATE 5 MG PO TABS: 5.0000 mg | ORAL_TABLET | Freq: Every day | ORAL | 0 refills | Status: DC

## 2019-09-11 MED ORDER — PIOGLITAZONE HCL 15 MG PO TABS: 15.0000 mg | ORAL_TABLET | Freq: Every day | ORAL | 0 refills | Status: DC

## 2019-09-11 MED ORDER — ATORVASTATIN CALCIUM 10 MG PO TABS: 10.0000 mg | ORAL_TABLET | Freq: Every day | ORAL | 0 refills | Status: AC

## 2019-09-11 NOTE — Progress Notes (Signed)
Patient is transferring to [facility] because she has insurance. They will not be able to see her until mid July. She is out of medications and needs refills until she sees her new physician group. She now uses [facility] for her prescription pick up. I want to renew her medications, 30 tablets for 30 days each.

## 2019-09-17 ENCOUNTER — Other Ambulatory Visit: Payer: Self-pay

## 2019-09-17 DIAGNOSIS — E0500 Thyrotoxicosis with diffuse goiter without thyrotoxic crisis or storm: Secondary | ICD-10-CM

## 2019-09-17 MED ORDER — PIOGLITAZONE HCL 15 MG PO TABS: 15.0000 mg | ORAL_TABLET | Freq: Every day | ORAL | 0 refills | Status: DC

## 2019-09-17 MED ORDER — LOSARTAN POTASSIUM 50 MG PO TABS: 50.0000 mg | ORAL_TABLET | Freq: Every day | ORAL | 0 refills | Status: DC

## 2019-09-17 MED ORDER — AMLODIPINE BESYLATE 5 MG PO TABS: 5.0000 mg | ORAL_TABLET | Freq: Every day | ORAL | 0 refills | Status: AC

## 2019-09-17 MED ORDER — METHIMAZOLE 10 MG PO TABS: 5.0000 mg | ORAL_TABLET | Freq: Every day | ORAL | 0 refills | Status: DC

## 2019-09-18 ENCOUNTER — Ambulatory Visit: Payer: Medicaid Other | Admitting: Internal Medicine

## 2019-09-18 ENCOUNTER — Other Ambulatory Visit: Payer: Self-pay

## 2019-09-18 ENCOUNTER — Encounter: Payer: Self-pay | Admitting: Internal Medicine

## 2019-09-18 DIAGNOSIS — I10 Essential (primary) hypertension: Secondary | ICD-10-CM

## 2019-09-18 NOTE — Progress Notes (Signed)
Established Patient Office Visit  Subjective:  Patient ID: Sarah [name], female    DOB: 07/03/1955  Age: 64 y.o. MRN: [id number]  CC: No chief complaint on file.   HPI Guynell Kleiber Neace presents for FU and check in for transition to new doctor.  All medication has been now sent to the correct pharmacy at [facility]. Patient will be transitioning to new physician system in the next few weeks. Everything seems to be going well, patient reports doing well.  Being transferred to new physician group first of July.  Patient will follow up if she needs help with anything.  Past Medical History:  Diagnosis Date   Diabetes mellitus without complication ([place])    Hyperlipidemia    Hypertension    Thyroid disease     Past Surgical History:  Procedure Laterality Date   ABDOMINAL HYSTERECTOMY     AMPUTATION Left 12/15/2017   Procedure: AMPUTATION DIGIT left long finger;  Surgeon: Dereck Leep, MD;  Location: [facility] ORS;  Service: Orthopedics;  Laterality: Left;   PARTIAL HYSTERECTOMY      Family History  Problem Relation Age of Onset   Diabetes Mother    Diabetes Sister    Stroke Sister    Breast cancer Neg Hx     Social History   Socioeconomic History   Marital status: Single    Spouse name: Not on file   Number of children: Not on file   Years of education: Not on file   Highest education level: 12th grade  Occupational History   Occupation: unemployed  Tobacco Use   Smoking status: Current Some Day Smoker    Types: Cigarettes   Smokeless tobacco: Never Used  Substance and Sexual Activity   Alcohol use: No    Alcohol/week: 0.0 standard drinks   Drug use: No   Sexual activity: Not on file  Other Topics Concern   Not on file  Social History Narrative   - pt not interested in participating    No consent form signed      Pt is on food stamps, her boyfriend is too who helps her out with finances   Social Determinants of Health    Financial Resource Strain:    Difficulty of Paying Living Expenses:   Food Insecurity:    Worried About Charity fundraiser in the Last Year:    Arboriculturist in the Last Year:   Transportation Needs:    Film/video editor (Medical):    Lack of Transportation (Non-Medical):   Physical Activity: Insufficiently Active   Days of Exercise per Week: 2 days   Minutes of Exercise per Session: 20 min  Stress:    Feeling of Stress :   Social Connections:    Frequency of Communication with Friends and Family:    Frequency of Social Gatherings with Friends and Family:    Attends Religious Services:    Active Member of Clubs or Organizations:    Attends Music therapist:    Marital Status:   Intimate Partner Violence:    Fear of Current or Ex-Partner:    Emotionally Abused:    Physically Abused:    Sexually Abused:     Outpatient Medications Prior to Visit  Medication Sig Dispense Refill   amLODipine (NORVASC) 5 MG tablet Take 1 tablet (5 mg total) by mouth daily. 90 tablet 0   atorvastatin (LIPITOR) 10 MG tablet Take 1 tablet (10 mg total) by mouth daily. 30 tablet  0   ibuprofen (ADVIL,MOTRIN) 200 MG tablet Take 600 mg by mouth daily. Takes when she does not have the meloxicam     losartan (COZAAR) 50 MG tablet Take 1 tablet (50 mg total) by mouth daily. 90 tablet 0   meloxicam (MOBIC) 15 MG tablet Take 1 tablet (15 mg total) by mouth daily. 90 tablet 0   metFORMIN (GLUCOPHAGE) 500 MG tablet Take 1.5 tablets (750 mg total) by mouth 2 (two) times daily with a meal. 90 tablet 2   methimazole (TAPAZOLE) 10 MG tablet Take 0.5 tablets (5 mg total) by mouth daily. 45 tablet 0   metoprolol tartrate (LOPRESSOR) 25 MG tablet TAKE ONE TABLET BY MOUTH 2 TIMES A DAY 180 tablet 0   pioglitazone (ACTOS) 15 MG tablet Take 1 tablet (15 mg total) by mouth daily. 90 tablet 0   No facility-administered medications prior to visit.    Allergies  Allergen  Reactions   Ace Inhibitors Nausea Only   Farxiga [Dapagliflozin]     Vaginitis     ROS Review of Systems    Objective:    Physical Exam  There were no vitals taken for this visit. Wt Readings from Last 3 Encounters:  08/14/19 197 lb 1.6 oz (89.4 kg)  05/22/19 201 lb 11.2 oz (91.5 kg)  05/21/18 190 lb (86.2 kg)     Health Maintenance Due  Topic Date Due   COVID-19 Vaccine (1) Never done   COLONOSCOPY  Never done   FOOT EXAM  03/23/2018   OPHTHALMOLOGY EXAM  12/29/2018    There are no preventive care reminders to display for this patient.  Lab Results  Component Value Date   TSH <0.005 (L) 08/14/2019   Lab Results  Component Value Date   WBC 8.4 05/08/2019   HGB 12.5 05/08/2019   HCT 36.8 05/08/2019   MCV 86 05/08/2019   PLT 167 05/08/2019   Lab Results  Component Value Date   NA 144 08/14/2019   K 4.2 08/14/2019   CO2 22 08/14/2019   GLUCOSE 174 (H) 08/14/2019   BUN 18 08/14/2019   CREATININE 0.72 08/14/2019   BILITOT 0.4 05/08/2019   ALKPHOS 124 (H) 05/08/2019   AST 14 05/08/2019   ALT 13 05/08/2019   PROT 7.5 05/08/2019   ALBUMIN 4.3 05/08/2019   CALCIUM 9.9 08/14/2019   ANIONGAP 9 12/17/2017   Lab Results  Component Value Date   CHOL 142 08/14/2019   Lab Results  Component Value Date   HDL 33 (L) 08/14/2019   Lab Results  Component Value Date   LDLCALC 80 08/14/2019   Lab Results  Component Value Date   TRIG 165 (H) 08/14/2019   Lab Results  Component Value Date   CHOLHDL 4.3 08/14/2019   Lab Results  Component Value Date   HGBA1C 7.5 (H) 08/14/2019      Assessment & Plan:   Problem List Items Addressed This Visit      Cardiovascular and Mediastinum   Essential (primary) hypertension - Primary      No orders of the defined types were placed in this encounter.   Follow-up: No follow-ups on file.    Nefertari Rebman Charyl Bigger

## 2019-09-27 DIAGNOSIS — E119 Type 2 diabetes mellitus without complications: Secondary | ICD-10-CM | POA: Diagnosis not present

## 2019-09-27 DIAGNOSIS — Z139 Encounter for screening, unspecified: Secondary | ICD-10-CM | POA: Diagnosis not present

## 2019-09-27 DIAGNOSIS — I10 Essential (primary) hypertension: Secondary | ICD-10-CM | POA: Diagnosis not present

## 2019-09-27 DIAGNOSIS — Z833 Family history of diabetes mellitus: Secondary | ICD-10-CM | POA: Diagnosis not present

## 2019-09-27 DIAGNOSIS — D649 Anemia, unspecified: Secondary | ICD-10-CM | POA: Diagnosis not present

## 2019-09-27 DIAGNOSIS — E0590 Thyrotoxicosis, unspecified without thyrotoxic crisis or storm: Secondary | ICD-10-CM | POA: Diagnosis not present

## 2019-09-27 DIAGNOSIS — Z1389 Encounter for screening for other disorder: Secondary | ICD-10-CM | POA: Diagnosis not present

## 2019-10-15 DIAGNOSIS — Z1211 Encounter for screening for malignant neoplasm of colon: Secondary | ICD-10-CM | POA: Diagnosis not present

## 2019-10-15 DIAGNOSIS — Z0000 Encounter for general adult medical examination without abnormal findings: Secondary | ICD-10-CM | POA: Diagnosis not present

## 2019-10-21 DIAGNOSIS — M160 Bilateral primary osteoarthritis of hip: Secondary | ICD-10-CM | POA: Diagnosis not present

## 2019-10-21 DIAGNOSIS — Z1211 Encounter for screening for malignant neoplasm of colon: Secondary | ICD-10-CM | POA: Diagnosis not present

## 2019-10-21 DIAGNOSIS — M545 Low back pain: Secondary | ICD-10-CM | POA: Diagnosis not present

## 2019-10-28 DIAGNOSIS — M545 Low back pain: Secondary | ICD-10-CM | POA: Diagnosis not present

## 2019-10-28 DIAGNOSIS — M1612 Unilateral primary osteoarthritis, left hip: Secondary | ICD-10-CM | POA: Diagnosis not present

## 2019-10-30 ENCOUNTER — Other Ambulatory Visit: Payer: Self-pay | Admitting: Orthopedic Surgery

## 2019-11-05 ENCOUNTER — Encounter: Admission: RE | Admit: 2019-11-05 | Payer: Medicare HMO | Source: Ambulatory Visit

## 2019-11-07 DIAGNOSIS — M1612 Unilateral primary osteoarthritis, left hip: Secondary | ICD-10-CM | POA: Diagnosis not present

## 2019-11-08 ENCOUNTER — Other Ambulatory Visit: Payer: Self-pay

## 2019-11-08 ENCOUNTER — Encounter
Admission: RE | Admit: 2019-11-08 | Discharge: 2019-11-08 | Disposition: A | Discharge status: Home / Self Care | Payer: Medicare HMO | Source: Ambulatory Visit | Attending: Orthopedic Surgery | Admitting: Orthopedic Surgery

## 2019-11-08 DIAGNOSIS — Z01818 Encounter for other preprocedural examination: Secondary | ICD-10-CM | POA: Insufficient documentation

## 2019-11-08 DIAGNOSIS — I10 Essential (primary) hypertension: Secondary | ICD-10-CM | POA: Diagnosis not present

## 2019-11-08 HISTORY — DX: Thyrotoxicosis, unspecified without thyrotoxic crisis or storm: E05.90

## 2019-11-08 HISTORY — DX: Unspecified osteoarthritis, unspecified site: M19.90

## 2019-11-08 LAB — BASIC METABOLIC PANEL
Anion gap: 12 (ref 5–15)
BUN: 10 mg/dL (ref 8–23)
CO2: 24 mmol/L (ref 22–32)
Calcium: 9.6 mg/dL (ref 8.9–10.3)
Chloride: 106 mmol/L (ref 98–111)
Creatinine, Ser: 0.5 mg/dL (ref 0.44–1.00)
GFR calc Af Amer: >60 mL/min (ref >60)
GFR calc non Af Amer: >60 mL/min (ref >60)
Glucose, Bld: 173 mg/dL — ABNORMAL HIGH (ref 70–99)
Potassium: 3.1 mmol/L — ABNORMAL LOW (ref 3.5–5.1)
Sodium: 142 mmol/L (ref 135–145)

## 2019-11-08 LAB — TYPE AND SCREEN
ABO/RH(D): O POS
Antibody Screen: NEGATIVE

## 2019-11-08 LAB — CBC
HCT: 34.5 % — ABNORMAL LOW (ref 36.0–46.0)
Hemoglobin: 11 g/dL — ABNORMAL LOW (ref 12.0–15.0)
MCH: 26.2 pg (ref 26.0–34.0)
MCHC: 31.9 g/dL (ref 30.0–36.0)
MCV: 82.1 fL (ref 80.0–100.0)
Platelets: 212 10*3/uL (ref 150–400)
RBC: 4.2 MIL/uL (ref 3.87–5.11)
RDW: 13.3 % (ref 11.5–15.5)
WBC: 8.9 10*3/uL (ref 4.0–10.5)
nRBC: 0 % (ref 0.0–0.2)

## 2019-11-08 LAB — APTT: aPTT: 31 seconds (ref 24–36)

## 2019-11-08 LAB — URINALYSIS, ROUTINE W REFLEX MICROSCOPIC
Bacteria, UA: NONE SEEN
Glucose, UA: 50 mg/dL — AB
Ketones, ur: 5 mg/dL — AB
Nitrite: NEGATIVE
Protein, ur: 100 mg/dL — AB
RBC / HPF: >50 RBC/hpf — ABNORMAL HIGH (ref 0–5)
Specific Gravity, Urine: 1.034 — ABNORMAL HIGH (ref 1.005–1.030)
pH: 5 (ref 5.0–8.0)

## 2019-11-08 LAB — SURGICAL PCR SCREEN
MRSA, PCR: NEGATIVE
Staphylococcus aureus: NEGATIVE

## 2019-11-08 LAB — PROTIME-INR
INR: 1.1 (ref 0.8–1.2)
Prothrombin Time: 13.5 seconds (ref 11.4–15.2)

## 2019-11-08 NOTE — Progress Notes (Addendum)
  [facility] Perioperative Services: Pre-Admission/Anesthesia Testing  Abnormal Lab Notification    Date: 11/08/19  Name: Ambria Mayfield MRN:   [id number]  Re: Abnormal labs noted during PAT appointment   Provider(s) Notified: Lovell Sheehan, MD and [name], PA-C Notification mode: Routed and/or faxed via [place] LAB VALUE(S): Lab Results  Component Value Date   K 3.1 (L) 11/08/2019   Lab Results  Component Value Date   COLORURINE AMBER (A) 11/08/2019   APPEARANCEUR CLOUDY (A) 11/08/2019   LABSPEC 1.034 (H) 11/08/2019   PHURINE 5.0 11/08/2019   GLUCOSEU 50 (A) 11/08/2019   HGBUR LARGE (A) 11/08/2019   BILIRUBINUR MODERATE (A) 11/08/2019   KETONESUR 5 (A) 11/08/2019   PROTEINUR 100 (A) 11/08/2019   UROBILINOGEN 0.2 03/23/2017   NITRITE NEGATIVE 11/08/2019   LEUKOCYTESUR MODERATE (A) 11/08/2019   EPIU 21-50 11/08/2019   WBCU 21-50 11/08/2019   RBCU >50 (H) 11/08/2019   BACTERIA NONE SEEN 11/08/2019    Notes: Patient is not a daily diuretic. UA concerning for infection. C&S added to assess for pathogenically significant infection. Patient scheduled for a LEFT total hip arthroplasty on 11/13/2019. Will send to surgeon and his APP for review and treatment. Will have SDS staff recheck K+ on the day of surgery. This is a Community education officer; no formal response is required.  Honor Loh, MSN, APRN, FNP-C, CEN [facility]  Peri-operative Services Nurse Practitioner Phone: [phone] 11/08/19 4:56 PM

## 2019-11-08 NOTE — Patient Instructions (Addendum)
Your procedure is scheduled on: Wed. 8/18 Report to Day Surgery. To find out your arrival time please call [phone] between 1PM - 3PM on Tues 8/17.  Remember: Instructions that are not followed completely may result in serious medical risk,  up to and including death, or upon the discretion of your surgeon and anesthesiologist your  surgery may need to be rescheduled.     _X__ 1. Do not eat food after midnight the night before your procedure.                 No chewing gum or hard candies. You may drink clear liquids up to 2 hours                 before you are scheduled to arrive for your surgery- DO not drink clear                 liquids within 2 hours of the start of your surgery.                 Clear Liquids include:  water, G2 or                  Gatorade Zero (avoid Red/Purple/Blue), Black Coffee or Tea (Do not add                 anything to coffee or tea). _____2.   Complete the "Ensure Clear Pre-surgery Clear Carbohydrate Drink" provided to you, 2 hours before arrival. **If you       are diabetic you will be provided with an alternative drink, Gatorade Zero or G2.  __X__2.  On the morning of surgery brush your teeth with toothpaste and water, you                may rinse your mouth with mouthwash if you wish.  Do not swallow any toothpaste of mouthwash.     ___ 3.  No Alcohol for 24 hours before or after surgery.  ___ 4.  Do Not Smoke or use e-cigarettes For 24 Hours Prior to Your Surgery.                 Do not use any chewable tobacco products for at least 6 hours prior to                 Surgery.  ___  5.  Do not use any recreational drugs (marijuana, cocaine, heroin, ecstasy, MDMA or other)                For at least one week prior to your surgery.  Combination of these drugs with anesthesia                May have life threatening results.  ____  6.  Bring all medications with you on the day of surgery if instructed.   _x___  7.  Notify  your doctor if there is any change in your medical condition      (cold, fever, infections).     Do not wear jewelry, make-up, hairpins, clips or nail polish. Do not wear lotions, powders, or perfumes. You may wear deodorant. Do not shave 48 hours prior to surgery. Men may shave face and neck. Do not bring valuables to the hospital.    [facility] is not responsible for any belongings or valuables.  Contacts, dentures or bridgework may not be worn into surgery. Leave your suitcase in the car. After surgery it  may be brought to your room. For patients admitted to the hospital, discharge time is determined by your treatment team.   Patients discharged the day of surgery will not be allowed to drive home.   Make arrangements for someone to be with you for the first 24 hours of your Same Day Discharge.    Please read over the following fact sheets that you were given:   Incentive spirometer  __x__ Take these medicines the morning of surgery with A SIP OF WATER:    1. amLODipine (NORVASC) 5 MG tablet  2. metoprolol tartrate (LOPRESSOR) 25 MG tablet  3. atorvastatin (LIPITOR) 10 MG tablet  4.methimazole (TAPAZOLE) 10 MG tablet  5.methocarbamol (ROBAXIN) 500 MG tablet  6.  ____ Fleet Enema (as directed)   __x__ Use CHG Soap (or wipes) as directed  ____ Use Benzoyl Peroxide Gel as instructed  ____ Use inhalers on the day of surgery  __x__ Stop metformin 2 days prior to surgery  Last dose on Sunday  8/15    ____ Take 1/2 of usual insulin dose the night before surgery. No insulin the morning          of surgery.   ____ Stop Coumadin/Plavix/aspirin on   __x__ Stop Anti-inflammatories ibuprofen (ADVIL,MOTRIN) 200 MG tablet, Aleve , Aspirin products or meloxicam today  May take Tylenol    ____ Stop supplements until after surgery.    ____ Bring C-Pap to the hospital.    If you have any questions regarding your pre-procedure instructions,  Please call Pre-admit Testing at  [phone]

## 2019-11-10 LAB — URINE CULTURE

## 2019-11-11 ENCOUNTER — Other Ambulatory Visit: Payer: Self-pay

## 2019-11-11 ENCOUNTER — Other Ambulatory Visit
Admission: RE | Admit: 2019-11-11 | Discharge: 2019-11-11 | Disposition: A | Discharge status: Home / Self Care | Payer: Medicare HMO | Source: Ambulatory Visit | Attending: Orthopedic Surgery | Admitting: Orthopedic Surgery

## 2019-11-11 DIAGNOSIS — Z01812 Encounter for preprocedural laboratory examination: Secondary | ICD-10-CM | POA: Insufficient documentation

## 2019-11-11 DIAGNOSIS — Z20822 Contact with and (suspected) exposure to covid-19: Secondary | ICD-10-CM | POA: Diagnosis not present

## 2019-11-11 LAB — SARS CORONAVIRUS 2 (TAT 6-24 HRS): SARS Coronavirus 2: NEGATIVE

## 2019-11-12 DIAGNOSIS — E119 Type 2 diabetes mellitus without complications: Secondary | ICD-10-CM | POA: Diagnosis not present

## 2019-11-12 DIAGNOSIS — I10 Essential (primary) hypertension: Secondary | ICD-10-CM | POA: Diagnosis not present

## 2019-11-13 ENCOUNTER — Ambulatory Visit: Payer: Medicare HMO | Admitting: Certified Registered"

## 2019-11-13 ENCOUNTER — Other Ambulatory Visit: Payer: Self-pay

## 2019-11-13 ENCOUNTER — Observation Stay
Admission: RE | Admit: 2019-11-13 | Discharge: 2019-11-14 | Disposition: A | Discharge status: Home / Self Care | Payer: Medicare HMO | Attending: Orthopedic Surgery | Admitting: Orthopedic Surgery

## 2019-11-13 ENCOUNTER — Encounter: Payer: Self-pay | Admitting: Orthopedic Surgery

## 2019-11-13 ENCOUNTER — Ambulatory Visit: Payer: Medicare HMO | Admitting: Urgent Care

## 2019-11-13 ENCOUNTER — Encounter
Admission: RE | Disposition: A | Discharge status: Home / Self Care | Payer: Self-pay | Source: Home / Self Care | Attending: Orthopedic Surgery

## 2019-11-13 ENCOUNTER — Ambulatory Visit: Payer: Medicare HMO

## 2019-11-13 DIAGNOSIS — Z96642 Presence of left artificial hip joint: Secondary | ICD-10-CM | POA: Insufficient documentation

## 2019-11-13 DIAGNOSIS — E785 Hyperlipidemia, unspecified: Secondary | ICD-10-CM | POA: Diagnosis not present

## 2019-11-13 DIAGNOSIS — E039 Hypothyroidism, unspecified: Secondary | ICD-10-CM | POA: Insufficient documentation

## 2019-11-13 DIAGNOSIS — E119 Type 2 diabetes mellitus without complications: Secondary | ICD-10-CM | POA: Insufficient documentation

## 2019-11-13 DIAGNOSIS — Z87891 Personal history of nicotine dependence: Secondary | ICD-10-CM | POA: Insufficient documentation

## 2019-11-13 DIAGNOSIS — M25552 Pain in left hip: Secondary | ICD-10-CM | POA: Diagnosis present

## 2019-11-13 DIAGNOSIS — I10 Essential (primary) hypertension: Secondary | ICD-10-CM | POA: Diagnosis not present

## 2019-11-13 DIAGNOSIS — Z419 Encounter for procedure for purposes other than remedying health state, unspecified: Secondary | ICD-10-CM

## 2019-11-13 DIAGNOSIS — Z471 Aftercare following joint replacement surgery: Secondary | ICD-10-CM | POA: Diagnosis not present

## 2019-11-13 DIAGNOSIS — M1612 Unilateral primary osteoarthritis, left hip: Secondary | ICD-10-CM | POA: Diagnosis not present

## 2019-11-13 HISTORY — PX: TOTAL HIP ARTHROPLASTY: SHX124

## 2019-11-13 LAB — POCT I-STAT, CHEM 8
BUN: 9 mg/dL (ref 8–23)
Calcium, Ion: 1.25 mmol/L (ref 1.15–1.40)
Chloride: 107 mmol/L (ref 98–111)
Creatinine, Ser: 0.4 mg/dL — ABNORMAL LOW (ref 0.44–1.00)
Glucose, Bld: 222 mg/dL — ABNORMAL HIGH (ref 70–99)
HCT: 35 % — ABNORMAL LOW (ref 36.0–46.0)
Hemoglobin: 11.9 g/dL — ABNORMAL LOW (ref 12.0–15.0)
Potassium: 3.3 mmol/L — ABNORMAL LOW (ref 3.5–5.1)
Sodium: 144 mmol/L (ref 135–145)
TCO2: 23 mmol/L (ref 22–32)

## 2019-11-13 LAB — ABO/RH: ABO/RH(D): O POS

## 2019-11-13 LAB — GLUCOSE, CAPILLARY
Glucose-Capillary: 198 mg/dL — ABNORMAL HIGH (ref 70–99)
Glucose-Capillary: 191 mg/dL — ABNORMAL HIGH (ref 70–99)

## 2019-11-13 SURGERY — ARTHROPLASTY, HIP, TOTAL, ANTERIOR APPROACH
Anesthesia: Spinal | Site: Hip | Laterality: Left

## 2019-11-13 MED ORDER — ACETAMINOPHEN 325 MG PO TABS: 325.0000 mg | ORAL_TABLET | Freq: Four times a day (QID) | ORAL | Status: DC | PRN

## 2019-11-13 MED ORDER — FENTANYL CITRATE (PF) 100 MCG/2ML IJ SOLN: 25.0000 ug | INTRAMUSCULAR | Status: DC | PRN

## 2019-11-13 MED ORDER — MIDAZOLAM HCL 5 MG/5ML IJ SOLN
INTRAMUSCULAR | Status: DC | PRN
  Administered 2019-11-13 (×2): 1 mg via INTRAVENOUS

## 2019-11-13 MED ORDER — POVIDONE-IODINE 10 % EX SWAB: 2.0000 "application " | Freq: Once | CUTANEOUS | Status: DC

## 2019-11-13 MED ORDER — SODIUM CHLORIDE 0.9 % IV SOLN: INTRAVENOUS | Status: DC

## 2019-11-13 MED ORDER — ACETAMINOPHEN 500 MG PO TABS
500.0000 mg | ORAL_TABLET | Freq: Four times a day (QID) | ORAL | Status: DC
  Administered 2019-11-14: 500 mg via ORAL

## 2019-11-13 MED ORDER — CEFAZOLIN SODIUM-DEXTROSE 2-4 GM/100ML-% IV SOLN
INTRAVENOUS | Status: AC
  Administered 2019-11-13: 2 g via INTRAVENOUS
  Filled 2019-11-13: qty 100

## 2019-11-13 MED ORDER — ATORVASTATIN CALCIUM 10 MG PO TABS
10.0000 mg | ORAL_TABLET | Freq: Every day | ORAL | Status: DC
  Administered 2019-11-14: 10 mg via ORAL
  Filled 2019-11-13: qty 1

## 2019-11-13 MED ORDER — AMLODIPINE BESYLATE 5 MG PO TABS
5.0000 mg | ORAL_TABLET | Freq: Every day | ORAL | Status: DC
  Administered 2019-11-13 – 2019-11-14 (×2): 5 mg via ORAL
  Filled 2019-11-13 (×2): qty 1

## 2019-11-13 MED ORDER — SODIUM CHLORIDE 0.9 % IV SOLN
INTRAVENOUS | Status: DC | PRN
  Administered 2019-11-13: 25 ug/min via INTRAVENOUS

## 2019-11-13 MED ORDER — METOPROLOL TARTRATE 25 MG PO TABS: 25.0000 mg | ORAL_TABLET | Freq: Every day | ORAL | Status: DC

## 2019-11-13 MED ORDER — DEXAMETHASONE SODIUM PHOSPHATE 10 MG/ML IJ SOLN
INTRAMUSCULAR | Status: AC
  Filled 2019-11-13: qty 1

## 2019-11-13 MED ORDER — METOCLOPRAMIDE HCL 5 MG/ML IJ SOLN: 5.0000 mg | Freq: Three times a day (TID) | INTRAMUSCULAR | Status: DC | PRN

## 2019-11-13 MED ORDER — MIDAZOLAM HCL 2 MG/2ML IJ SOLN
INTRAMUSCULAR | Status: AC
  Filled 2019-11-13: qty 2

## 2019-11-13 MED ORDER — METFORMIN HCL 500 MG PO TABS
750.0000 mg | ORAL_TABLET | Freq: Two times a day (BID) | ORAL | Status: DC
  Administered 2019-11-14: 750 mg via ORAL
  Filled 2019-11-13 (×2): qty 2

## 2019-11-13 MED ORDER — MAGNESIUM HYDROXIDE 400 MG/5ML PO SUSP
30.0000 mL | Freq: Every day | ORAL | Status: DC | PRN
  Filled 2019-11-13: qty 30

## 2019-11-13 MED ORDER — ACETAMINOPHEN 10 MG/ML IV SOLN
INTRAVENOUS | Status: DC | PRN
  Administered 2019-11-13: 1000 mg via INTRAVENOUS

## 2019-11-13 MED ORDER — FENTANYL CITRATE (PF) 100 MCG/2ML IJ SOLN
INTRAMUSCULAR | Status: AC
  Filled 2019-11-13: qty 2

## 2019-11-13 MED ORDER — LACTATED RINGERS IV SOLN: INTRAVENOUS | Status: DC

## 2019-11-13 MED ORDER — BUPIVACAINE-EPINEPHRINE (PF) 0.25% -1:200000 IJ SOLN
INTRAMUSCULAR | Status: DC | PRN
  Administered 2019-11-13: 30 mL via PERINEURAL

## 2019-11-13 MED ORDER — CHLORHEXIDINE GLUCONATE 0.12 % MT SOLN
OROMUCOSAL | Status: AC
  Filled 2019-11-13: qty 15

## 2019-11-13 MED ORDER — CEFAZOLIN SODIUM-DEXTROSE 2-4 GM/100ML-% IV SOLN: 2.0000 g | Freq: Four times a day (QID) | INTRAVENOUS | Status: AC

## 2019-11-13 MED ORDER — DOCUSATE SODIUM 100 MG PO CAPS
100.0000 mg | ORAL_CAPSULE | Freq: Two times a day (BID) | ORAL | Status: DC
  Administered 2019-11-13 – 2019-11-14 (×2): 100 mg via ORAL
  Filled 2019-11-13 (×3): qty 1

## 2019-11-13 MED ORDER — ONDANSETRON HCL 4 MG PO TABS: 4.0000 mg | ORAL_TABLET | Freq: Four times a day (QID) | ORAL | Status: DC | PRN

## 2019-11-13 MED ORDER — MAGNESIUM CITRATE PO SOLN
1.0000 | Freq: Once | ORAL | Status: DC | PRN
  Filled 2019-11-13: qty 296

## 2019-11-13 MED ORDER — PHENOL 1.4 % MT LIQD
1.0000 | OROMUCOSAL | Status: DC | PRN
  Filled 2019-11-13: qty 177

## 2019-11-13 MED ORDER — TRANEXAMIC ACID-NACL 1000-0.7 MG/100ML-% IV SOLN
INTRAVENOUS | Status: AC
  Filled 2019-11-13: qty 100

## 2019-11-13 MED ORDER — ONDANSETRON HCL 4 MG/2ML IJ SOLN: 4.0000 mg | Freq: Four times a day (QID) | INTRAMUSCULAR | Status: DC | PRN

## 2019-11-13 MED ORDER — ESMOLOL HCL 100 MG/10ML IV SOLN
INTRAVENOUS | Status: DC | PRN
  Administered 2019-11-13: 10 mg via INTRAVENOUS

## 2019-11-13 MED ORDER — KETOROLAC TROMETHAMINE 15 MG/ML IJ SOLN
INTRAMUSCULAR | Status: AC
  Administered 2019-11-13: 15 mg via INTRAVENOUS
  Filled 2019-11-13: qty 1

## 2019-11-13 MED ORDER — SODIUM CHLORIDE 0.9 % IR SOLN
Status: DC | PRN
  Administered 2019-11-13: 1000 mL

## 2019-11-13 MED ORDER — ASPIRIN 81 MG PO CHEW
81.0000 mg | CHEWABLE_TABLET | Freq: Two times a day (BID) | ORAL | Status: DC
  Administered 2019-11-13: 81 mg via ORAL

## 2019-11-13 MED ORDER — GLYCOPYRROLATE 0.2 MG/ML IJ SOLN
INTRAMUSCULAR | Status: AC
  Filled 2019-11-13: qty 1

## 2019-11-13 MED ORDER — HYDROCODONE-ACETAMINOPHEN 7.5-325 MG PO TABS
ORAL_TABLET | ORAL | Status: AC
  Administered 2019-11-13: 1 via ORAL
  Filled 2019-11-13: qty 1

## 2019-11-13 MED ORDER — FENTANYL CITRATE (PF) 100 MCG/2ML IJ SOLN
INTRAMUSCULAR | Status: DC | PRN
  Administered 2019-11-13 (×2): 50 ug via INTRAVENOUS

## 2019-11-13 MED ORDER — HYDROCODONE-ACETAMINOPHEN 7.5-325 MG PO TABS: 1.0000 | ORAL_TABLET | ORAL | Status: DC | PRN

## 2019-11-13 MED ORDER — BISACODYL 10 MG RE SUPP
10.0000 mg | Freq: Every day | RECTAL | Status: DC | PRN
  Filled 2019-11-13: qty 1

## 2019-11-13 MED ORDER — TRANEXAMIC ACID-NACL 1000-0.7 MG/100ML-% IV SOLN
1000.0000 mg | INTRAVENOUS | Status: AC
  Administered 2019-11-13: 1000 mg via INTRAVENOUS

## 2019-11-13 MED ORDER — ALUM & MAG HYDROXIDE-SIMETH 200-200-20 MG/5ML PO SUSP: 30.0000 mL | ORAL | Status: DC | PRN

## 2019-11-13 MED ORDER — CEFAZOLIN SODIUM-DEXTROSE 2-4 GM/100ML-% IV SOLN
INTRAVENOUS | Status: AC
  Administered 2019-11-14: 2 g via INTRAVENOUS
  Filled 2019-11-13: qty 100

## 2019-11-13 MED ORDER — ONDANSETRON HCL 4 MG/2ML IJ SOLN
INTRAMUSCULAR | Status: AC
  Filled 2019-11-13: qty 2

## 2019-11-13 MED ORDER — LOSARTAN POTASSIUM 50 MG PO TABS
50.0000 mg | ORAL_TABLET | Freq: Every day | ORAL | Status: DC
  Administered 2019-11-13 – 2019-11-14 (×2): 50 mg via ORAL
  Filled 2019-11-13 (×2): qty 1

## 2019-11-13 MED ORDER — EPHEDRINE SULFATE 50 MG/ML IJ SOLN
INTRAMUSCULAR | Status: DC | PRN
  Administered 2019-11-13: 5 mg via INTRAVENOUS

## 2019-11-13 MED ORDER — PROMETHAZINE HCL 25 MG/ML IJ SOLN: 6.2500 mg | INTRAMUSCULAR | Status: DC | PRN

## 2019-11-13 MED ORDER — MENTHOL 3 MG MT LOZG
1.0000 | LOZENGE | OROMUCOSAL | Status: DC | PRN
  Filled 2019-11-13: qty 9

## 2019-11-13 MED ORDER — CEFAZOLIN SODIUM-DEXTROSE 2-4 GM/100ML-% IV SOLN
2.0000 g | INTRAVENOUS | Status: AC
  Administered 2019-11-13: 2 g via INTRAVENOUS

## 2019-11-13 MED ORDER — METHIMAZOLE 5 MG PO TABS
5.0000 mg | ORAL_TABLET | Freq: Every day | ORAL | Status: DC
  Administered 2019-11-14: 5 mg via ORAL
  Filled 2019-11-13: qty 1

## 2019-11-13 MED ORDER — ESMOLOL HCL 100 MG/10ML IV SOLN
INTRAVENOUS | Status: AC
  Filled 2019-11-13: qty 10

## 2019-11-13 MED ORDER — HYDROCODONE-ACETAMINOPHEN 5-325 MG PO TABS: 1.0000 | ORAL_TABLET | ORAL | Status: DC | PRN

## 2019-11-13 MED ORDER — ONDANSETRON HCL 4 MG/2ML IJ SOLN
INTRAMUSCULAR | Status: DC | PRN
  Administered 2019-11-13: 4 mg via INTRAVENOUS

## 2019-11-13 MED ORDER — MORPHINE SULFATE (PF) 2 MG/ML IV SOLN: 0.5000 mg | INTRAVENOUS | Status: DC | PRN

## 2019-11-13 MED ORDER — BUPIVACAINE HCL (PF) 0.5 % IJ SOLN
INTRAMUSCULAR | Status: DC | PRN
  Administered 2019-11-13: 3 mL

## 2019-11-13 MED ORDER — ORAL CARE MOUTH RINSE: 15.0000 mL | Freq: Once | OROMUCOSAL | Status: AC

## 2019-11-13 MED ORDER — PROPOFOL 500 MG/50ML IV EMUL
INTRAVENOUS | Status: DC | PRN
  Administered 2019-11-13: 75 ug/kg/min via INTRAVENOUS

## 2019-11-13 MED ORDER — CHLORHEXIDINE GLUCONATE 0.12 % MT SOLN
15.0000 mL | Freq: Once | OROMUCOSAL | Status: AC
  Administered 2019-11-13: 15 mL via OROMUCOSAL

## 2019-11-13 MED ORDER — METOCLOPRAMIDE HCL 10 MG PO TABS: 5.0000 mg | ORAL_TABLET | Freq: Three times a day (TID) | ORAL | Status: DC | PRN

## 2019-11-13 MED ORDER — KETOROLAC TROMETHAMINE 15 MG/ML IJ SOLN: 15.0000 mg | Freq: Four times a day (QID) | INTRAMUSCULAR | Status: DC

## 2019-11-13 MED ORDER — ACETAMINOPHEN 10 MG/ML IV SOLN
INTRAVENOUS | Status: AC
  Filled 2019-11-13: qty 100

## 2019-11-13 SURGICAL SUPPLY — 50 items
BIT DRILL STRL 25MM (BIT) ×1 IMPLANT
BLADE SAGITTAL WIDE XTHICK NO (BLADE) ×3 IMPLANT
BRUSH SCRUB EZ  4% CHG (MISCELLANEOUS) ×2
BRUSH SCRUB EZ 4% CHG (MISCELLANEOUS) ×1 IMPLANT
CHLORAPREP W/TINT 26 (MISCELLANEOUS) ×3 IMPLANT
COVER HOLE (Hips) ×3 IMPLANT
COVER WAND RF STERILE (DRAPES) ×3 IMPLANT
DRAPE 3/4 80X56 (DRAPES) ×3 IMPLANT
DRAPE C-ARM 42X72 X-RAY (DRAPES) ×3 IMPLANT
DRAPE STERI IOBAN 125X83 (DRAPES) IMPLANT
DRILL BIT STERILE 25MM (BIT) ×3
DRSG AQUACEL AG ADV 3.5X10 (GAUZE/BANDAGES/DRESSINGS) ×3 IMPLANT
DRSG AQUACEL AG ADV 3.5X14 (GAUZE/BANDAGES/DRESSINGS) IMPLANT
ELECT BLADE 6.5 EXT (BLADE) ×3 IMPLANT
ELECT REM PT RETURN 9FT ADLT (ELECTROSURGICAL) ×3
ELECTRODE REM PT RTRN 9FT ADLT (ELECTROSURGICAL) ×1 IMPLANT
GAUZE XEROFORM 1X8 LF (GAUZE/BANDAGES/DRESSINGS) ×3 IMPLANT
GLOVE INDICATOR 8.0 STRL GRN (GLOVE) ×3 IMPLANT
GLOVE SURG ORTHO 8.0 STRL STRW (GLOVE) ×6 IMPLANT
GOWN STRL REUS W/ TWL LRG LVL3 (GOWN DISPOSABLE) ×1 IMPLANT
GOWN STRL REUS W/ TWL XL LVL3 (GOWN DISPOSABLE) ×1 IMPLANT
GOWN STRL REUS W/TWL LRG LVL3 (GOWN DISPOSABLE) ×2
GOWN STRL REUS W/TWL XL LVL3 (GOWN DISPOSABLE) ×2
HEAD OXINIUM PLUS 0 32MM (Hips) ×3 IMPLANT
HOOD PEEL AWAY FLYTE STAYCOOL (MISCELLANEOUS) ×9 IMPLANT
IV NS 1000ML (IV SOLUTION) ×2
IV NS 1000ML BAXH (IV SOLUTION) ×1 IMPLANT
KIT PATIENT CARE HANA TABLE (KITS) ×3 IMPLANT
KIT TURNOVER CYSTO (KITS) ×3 IMPLANT
LINER 3H HEMI SHELL 48MM (Liner) ×3 IMPLANT
LINER ACETABULAR 32X48 (Liner) ×3 IMPLANT
MAT ABSORB  FLUID 56X50 GRAY (MISCELLANEOUS) ×2
MAT ABSORB FLUID 56X50 GRAY (MISCELLANEOUS) ×1 IMPLANT
NDL SAFETY ECLIPSE 18X1.5 (NEEDLE) ×2 IMPLANT
NEEDLE HYPO 18GX1.5 SHARP (NEEDLE) ×4
NEEDLE HYPO 22GX1.5 SAFETY (NEEDLE) ×3 IMPLANT
NEEDLE SPNL 20GX3.5 QUINCKE YW (NEEDLE) ×3 IMPLANT
PACK HIP PROSTHESIS (MISCELLANEOUS) ×3 IMPLANT
PADDING CAST BLEND 4X4 NS (MISCELLANEOUS) ×6 IMPLANT
PILLOW ABDUCTION MEDIUM (MISCELLANEOUS) ×3 IMPLANT
PULSAVAC PLUS IRRIG FAN TIP (DISPOSABLE) ×3
SCREW 6.5X25MM (Screw) ×3 IMPLANT
STAPLER SKIN PROX 35W (STAPLE) ×3 IMPLANT
STEM STD COLLAR SZ2 POLARSTEM (Stem) ×3 IMPLANT
SUT BONE WAX W31G (SUTURE) ×3 IMPLANT
SUT DVC 2 QUILL PDO  T11 36X36 (SUTURE) ×2
SUT DVC 2 QUILL PDO T11 36X36 (SUTURE) ×1 IMPLANT
SUT VIC AB 2-0 CT1 18 (SUTURE) ×3 IMPLANT
SYR 20ML LL LF (SYRINGE) ×3 IMPLANT
TIP FAN IRRIG PULSAVAC PLUS (DISPOSABLE) ×1 IMPLANT

## 2019-11-13 NOTE — Anesthesia Procedure Notes (Signed)
Procedure Name: MAC Performed by: Kelton Pillar, CRNA Pre-anesthesia Checklist: Patient identified, Emergency Drugs available, Suction available and Patient being monitored Patient Re-evaluated:Patient Re-evaluated prior to induction Oxygen Delivery Method: Simple face mask Induction Type: IV induction Placement Confirmation: positive ETCO2 and CO2 detector Dental Injury: Teeth and Oropharynx as per pre-operative assessment

## 2019-11-13 NOTE — Anesthesia Preprocedure Evaluation (Signed)
Anesthesia Evaluation  Patient identified by MRN, date of birth, ID band Patient awake    Reviewed: Allergy & Precautions, NPO status , Patient's Chart, lab work & pertinent test results  History of Anesthesia Complications Negative for: history of anesthetic complications  Airway Mallampati: III       Dental  (+) Missing, Chipped, Dental Advidsory Given   Pulmonary neg shortness of breath, neg sleep apnea, neg COPD, neg recent URI, Current Smoker, former smoker,    Pulmonary exam normal        Cardiovascular hypertension, Pt. on medications (-) angina(-) Past MI and (-) CHF Normal cardiovascular exam(-) dysrhythmias (-) Valvular Problems/Murmurs     Neuro/Psych neg Seizures    GI/Hepatic negative GI ROS, Neg liver ROS, neg GERD  ,  Endo/Other  diabetes, Type 2, Oral Hypoglycemic AgentsHyperthyroidism   Renal/GU negative Renal ROS     Musculoskeletal   Abdominal   Peds  Hematology   Anesthesia Other Findings Past Medical History: No date: Arthritis No date: Diabetes mellitus without complication (HCC) No date: Hyperlipidemia No date: Hypertension No date: Hyperthyroidism No date: Thyroid disease   Reproductive/Obstetrics negative OB ROS                             Anesthesia Physical  Anesthesia Plan  ASA: III  Anesthesia Plan: Spinal   Post-op Pain Management:    Induction: Intravenous  PONV Risk Score and Plan: 2 and Propofol infusion and TIVA  Airway Management Planned: Natural Airway and Simple Face Mask  Additional Equipment:   Intra-op Plan:   Post-operative Plan:   Informed Consent: I have reviewed the patients History and Physical, chart, labs and discussed the procedure including the risks, benefits and alternatives for the proposed anesthesia with the patient or authorized representative who has indicated his/her understanding and acceptance.       Plan  Discussed with:   Anesthesia Plan Comments:         Anesthesia Quick Evaluation

## 2019-11-13 NOTE — H&P (Signed)
The patient has been re-examined, and the chart reviewed, and there have been no interval changes to the documented history and physical.  Plan a left total hip today.  Anesthesia is not consulted regarding a peripheral nerve block for post-operative pain.  The risks, benefits, and alternatives have been discussed at length, and the patient is willing to proceed.    

## 2019-11-13 NOTE — Transfer of Care (Signed)
Immediate Anesthesia Transfer of Care Note  Patient: Sarah [name]  Procedure(s) Performed: TOTAL HIP ARTHROPLASTY ANTERIOR APPROACH (Left Hip)  Patient Location: PACU  Anesthesia Type:General  Level of Consciousness: awake, drowsy and patient cooperative  Airway & Oxygen Therapy: Patient Spontanous Breathing and Patient connected to face mask oxygen  Post-op Assessment: Report given to RN and Post -op Vital signs reviewed and stable  Post vital signs: Reviewed and stable  Last Vitals:  Vitals Value Taken Time  BP 166/74 11/13/19 1703  Temp 36.8 C 11/13/19 1703  Pulse 80 11/13/19 1705  Resp 16 11/13/19 1705  SpO2 100 % 11/13/19 1705  Vitals shown include unvalidated device data.  Last Pain:  Vitals:   11/13/19 1429  TempSrc: Oral  PainSc: 0-No pain         Complications: No complications documented.

## 2019-11-13 NOTE — Op Note (Signed)
11/13/2019  4:51 PM  PATIENT:  Kerstin Crusoe   MRN: [id number]  PRE-OPERATIVE DIAGNOSIS:  Osteoarthritis left hip   POST-OPERATIVE DIAGNOSIS: Same  Procedure: Left Total Hip Replacement  Surgeon: Elyn Aquas. Harlow Mares, MD   Assist: [name], PA-C  Anesthesia: Spinal   EBL: 150 mL   Specimens: None   Drains: None   Components used: A size 2 Polarstem [employer]hew, R3 size 48 mm shell, and a 32 mm +0 mm head    Description of the procedure in detail: After informed consent was obtained and the appropriate extremity marked in the pre-operative holding area, the patient was taken to the operating room and placed in the supine position on the fracture table. All pressure points were well padded and bilateral lower extremities were place in traction spars. The hip was prepped and draped in standard sterile fashion. A spinal anesthetic had been delivered by the anesthesia team. The skin and subcutaneous tissues were injected with a mixture of Marcaine with epinephrine for post-operative pain. A longitudinal incision approximately 10 cm in length was carried out from the anterior superior iliac spine to the greater trochanter. The tensor fascia was divided and blunt dissection was taken down to the level of the joint capsule. The lateral circumflex vessels were cauterized. Deep retractors were placed and a portion of the anterior capsule was excised. Using fluoroscopy the neck cut was planned and carried out with a sagittal saw. The head was passed from the field with use of a corkscrew and hip skid. Deep retractors were placed along the acetabulum and the degenerative labrum and large osteophytes were removed with a Rongeur. The cup was sequentially reamed to a size 48 mm. The wound was irrigated and using fluoroscopy the size 48 mm cup was impacted in to anatomic position. A single screw was placed followed by a threaded hole cover. The final liner was impacted in to position.  Attention was then turned to the proximal femur. The leg was placed in extension and external rotation. The canal was opened and sequentially broached to a size 2. The trial components were placed and the hip relocated. The components were found to be in good position using fluoroscopy. The hip was dislocated and the trial components removed. The final components were impacted in to position and the hip relocated. The final components were again check with fluoroscopy and found to be in good position. Hemostasis was achieved with electrocautery. The deep capsule was injected with Marcaine and epinephrine. The wound was irrigated with bacitracin laced normal saline and the tensor fascia closed with #2 Quill suture. The subcutaneous tissues were closed with 2-0 vicryl and staples for the skin. A sterile dressing was applied and an abduction pillow. Patient tolerated the procedure well and there were no apparent complication. Patient was taken to the recovery room in good condition.   Kurtis Bushman, MD

## 2019-11-13 NOTE — Anesthesia Procedure Notes (Signed)
Spinal  Patient location during procedure: OR Start time: 11/13/2019 3:20 PM End time: 11/13/2019 3:22 PM Staffing Performed: anesthesiologist  Anesthesiologist: Martha Clan, MD Preanesthetic Checklist Completed: patient identified, IV checked, site marked, risks and benefits discussed, surgical consent, monitors and equipment checked, pre-op evaluation and timeout performed Spinal Block Patient position: sitting Prep: ChloraPrep Patient monitoring: heart rate, cardiac monitor, continuous pulse ox and blood pressure Approach: midline Location: L2-3 Injection technique: single-shot Needle Needle type: Sprotte  Needle gauge: 24 G Needle length: 9 cm Assessment Sensory level: T6 Additional Notes IV functioning, monitors applied to pt. Expiration date of kit checked and confirmed to be in date. Sterile prep and drape, hand hygiene and sterile gloved used. Pt was positioned and spine was prepped in sterile fashion. Skin was anesthetized with lidocaine. Free flow of clear CSF obtained prior to injecting local anesthetic into CSF x 1 attempt. Spinal needle aspirated freely following injection. Needle was carefully withdrawn, and pt tolerated procedure well. Loss of motor and sensory on exam post injection.

## 2019-11-14 ENCOUNTER — Encounter: Payer: Self-pay | Admitting: Orthopedic Surgery

## 2019-11-14 DIAGNOSIS — Z87891 Personal history of nicotine dependence: Secondary | ICD-10-CM | POA: Diagnosis not present

## 2019-11-14 DIAGNOSIS — E119 Type 2 diabetes mellitus without complications: Secondary | ICD-10-CM | POA: Diagnosis not present

## 2019-11-14 DIAGNOSIS — M1612 Unilateral primary osteoarthritis, left hip: Secondary | ICD-10-CM | POA: Diagnosis not present

## 2019-11-14 DIAGNOSIS — E039 Hypothyroidism, unspecified: Secondary | ICD-10-CM | POA: Diagnosis not present

## 2019-11-14 DIAGNOSIS — I10 Essential (primary) hypertension: Secondary | ICD-10-CM | POA: Diagnosis not present

## 2019-11-14 DIAGNOSIS — R531 Weakness: Secondary | ICD-10-CM | POA: Diagnosis not present

## 2019-11-14 DIAGNOSIS — Z96642 Presence of left artificial hip joint: Secondary | ICD-10-CM | POA: Diagnosis not present

## 2019-11-14 LAB — CBC
HCT: 29.5 % — ABNORMAL LOW (ref 36.0–46.0)
Hemoglobin: 9.6 g/dL — ABNORMAL LOW (ref 12.0–15.0)
MCH: 26.7 pg (ref 26.0–34.0)
MCHC: 32.5 g/dL (ref 30.0–36.0)
MCV: 81.9 fL (ref 80.0–100.0)
Platelets: 164 10*3/uL (ref 150–400)
RBC: 3.6 MIL/uL — ABNORMAL LOW (ref 3.87–5.11)
RDW: 13.3 % (ref 11.5–15.5)
WBC: 11.6 10*3/uL — ABNORMAL HIGH (ref 4.0–10.5)
nRBC: 0 % (ref 0.0–0.2)

## 2019-11-14 LAB — BASIC METABOLIC PANEL
Anion gap: 11 (ref 5–15)
BUN: 12 mg/dL (ref 8–23)
CO2: 21 mmol/L — ABNORMAL LOW (ref 22–32)
Calcium: 9.1 mg/dL (ref 8.9–10.3)
Chloride: 106 mmol/L (ref 98–111)
Creatinine, Ser: 0.52 mg/dL (ref 0.44–1.00)
GFR calc Af Amer: >60 mL/min (ref >60)
GFR calc non Af Amer: >60 mL/min (ref >60)
Glucose, Bld: 364 mg/dL — ABNORMAL HIGH (ref 70–99)
Potassium: 3.6 mmol/L (ref 3.5–5.1)
Sodium: 138 mmol/L (ref 135–145)

## 2019-11-14 LAB — HEMOGLOBIN A1C
Hgb A1c MFr Bld: 7.5 % — ABNORMAL HIGH (ref 4.8–5.6)
Mean Plasma Glucose: 168.55 mg/dL

## 2019-11-14 LAB — GLUCOSE, CAPILLARY
Glucose-Capillary: 321 mg/dL — ABNORMAL HIGH (ref 70–99)
Glucose-Capillary: 316 mg/dL — ABNORMAL HIGH (ref 70–99)

## 2019-11-14 MED ORDER — CEFAZOLIN SODIUM-DEXTROSE 2-4 GM/100ML-% IV SOLN
INTRAVENOUS | Status: AC
  Filled 2019-11-14: qty 100

## 2019-11-14 MED ORDER — ACETAMINOPHEN 500 MG PO TABS
ORAL_TABLET | ORAL | Status: AC
  Filled 2019-11-14: qty 1

## 2019-11-14 MED ORDER — INSULIN ASPART 100 UNIT/ML ~~LOC~~ SOLN
SUBCUTANEOUS | Status: AC
  Filled 2019-11-14: qty 1

## 2019-11-14 MED ORDER — DOCUSATE SODIUM 100 MG PO CAPS: 100.0000 mg | ORAL_CAPSULE | Freq: Two times a day (BID) | ORAL | 0 refills | Status: DC

## 2019-11-14 MED ORDER — HYDROCODONE-ACETAMINOPHEN 5-325 MG PO TABS
1.0000 | ORAL_TABLET | ORAL | 0 refills | Status: DC | PRN
Start: 2019-11-14 — End: 2020-08-05

## 2019-11-14 MED ORDER — ASPIRIN 81 MG PO CHEW
81.0000 mg | CHEWABLE_TABLET | Freq: Two times a day (BID) | ORAL | 0 refills | Status: DC
Start: 2019-11-14 — End: 2020-08-26

## 2019-11-14 MED ORDER — ONDANSETRON HCL 4 MG PO TABS: 4.0000 mg | ORAL_TABLET | Freq: Four times a day (QID) | ORAL | 0 refills | Status: DC | PRN

## 2019-11-14 MED ORDER — ASPIRIN 81 MG PO CHEW
CHEWABLE_TABLET | ORAL | Status: AC
  Administered 2019-11-14: 81 mg via ORAL
  Filled 2019-11-14: qty 1

## 2019-11-14 MED ORDER — METOPROLOL TARTRATE 25 MG PO TABS
ORAL_TABLET | ORAL | Status: AC
  Administered 2019-11-14: 25 mg via ORAL
  Filled 2019-11-14: qty 1

## 2019-11-14 MED ORDER — INSULIN ASPART 100 UNIT/ML ~~LOC~~ SOLN
0.0000 [IU] | Freq: Three times a day (TID) | SUBCUTANEOUS | Status: DC
  Administered 2019-11-14: 11 [IU] via SUBCUTANEOUS

## 2019-11-14 MED ORDER — KETOROLAC TROMETHAMINE 15 MG/ML IJ SOLN
INTRAMUSCULAR | Status: AC
  Administered 2019-11-14: 15 mg via INTRAVENOUS
  Filled 2019-11-14: qty 1

## 2019-11-14 MED ORDER — ACETAMINOPHEN 500 MG PO TABS
ORAL_TABLET | ORAL | Status: AC
  Administered 2019-11-14: 500 mg via ORAL
  Filled 2019-11-14: qty 1

## 2019-11-14 NOTE — Evaluation (Signed)
Physical Therapy Evaluation Patient Details Name: Sarah [name] MRN: [id number] DOB: [birth year] Today's Date: 11/14/2019   History of Present Illness  Pt admitted for L THR and is POD 1 at time of evaluation. Pt very eager to perform PT.  Clinical Impression  Pt is a pleasant 64 year old female who was admitted for L THR. Pt performs bed mobility with supervision, transfers with cga, and ambulation with cga using RW. Pt demonstrates deficits with strength/safety awareness. Would benefit from skilled PT to address above deficits and promote optimal return to PLOF. Recommend transition to [facility] upon discharge from acute hospitalization.    Follow Up Recommendations Home health PT    Equipment Recommendations  3in1 (PT) (may need RW if boyfriend can't find his)    Recommendations for Other Services       Precautions / Restrictions Precautions Precautions: Fall;Anterior Hip Precaution Booklet Issued: No Restrictions Weight Bearing Restrictions: Yes LLE Weight Bearing: Weight bearing as tolerated      Mobility  Bed Mobility Overal bed mobility: Needs Assistance Bed Mobility: Supine to Sit     Supine to sit: Supervision     General bed mobility comments: needs cues for sequencing. Once seated at EOB, able to sit with upright posture  Transfers Overall transfer level: Needs assistance Equipment used: Rolling walker (2 wheeled) Transfers: Sit to/from Stand Sit to Stand: Min guard         General transfer comment: safe technique with cues for pushing from seated surface. Once standing, upright posture noted  Ambulation/Gait Ambulation/Gait assistance: Min guard Gait Distance (Feet): 140 Feet Assistive device: Rolling walker (2 wheeled) Gait Pattern/deviations: Step-through pattern     General Gait Details: ambulated in hallway with reciprocal gait pattern and fluid sequencing. Slight fatigue noted.  Stairs            Wheelchair Mobility    Modified  Rankin (Stroke Patients Only)       Balance Overall balance assessment: Needs assistance Sitting-balance support: Feet supported Sitting balance-Leahy Scale: Good     Standing balance support: Bilateral upper extremity supported Standing balance-Leahy Scale: Good                               Pertinent Vitals/Pain Pain Assessment: 0-10 Pain Score: 0-No pain Pain Intervention(s): Premedicated before session;Ice applied    Home Living Family/patient expects to be discharged to:: Private residence Living Arrangements: Spouse/significant other (boyfriend) Available Help at Discharge: Family;Friend(s) (boyfriend & sister both avail. PRN) Type of Home: House Home Access: Stairs to enter Entrance Stairs-Rails:  (uses posts, however no formalized railing) Technical brewer of Steps: 2 Home Layout: One level Home Equipment: [place] - 2 wheels      Prior Function Level of Independence: Independent with assistive device(s)         Comments: using QC for mobility prior to Sx. Reports 1 recent fall     Hand Dominance        Extremity/Trunk Assessment   Upper Extremity Assessment Upper Extremity Assessment: Overall WFL for tasks assessed    Lower Extremity Assessment Lower Extremity Assessment: Generalized weakness (R LE grossly 5/5; L LE grossly 4/5)       Communication   Communication: No difficulties  Cognition Arousal/Alertness: Awake/alert Behavior During Therapy: WFL for tasks assessed/performed Overall Cognitive Status: Within Functional Limits for tasks assessed  General Comments      Exercises Other Exercises Other Exercises: supine ther-ex performed on L LE including AP, QS, GS, and hip abd/add. All ther-ex performed x 12 reps with cga. Cues for sequencing   Assessment/Plan    PT Assessment Patient needs continued PT services  PT Problem List Decreased  strength;Decreased balance;Decreased mobility;Pain       PT Treatment Interventions DME instruction;Gait training;Stair training;Therapeutic exercise    PT Goals (Current goals can be found in the Care Plan section)  Acute Rehab PT Goals Patient Stated Goal: to go home PT Goal Formulation: With patient Time For Goal Achievement: 11/28/19 Potential to Achieve Goals: Good    Frequency BID   Barriers to discharge        Co-evaluation               AM-PAC PT "6 Clicks" Mobility  Outcome Measure Help needed turning from your back to your side while in a flat bed without using bedrails?: A Little Help needed moving from lying on your back to sitting on the side of a flat bed without using bedrails?: A Little Help needed moving to and from a bed to a chair (including a wheelchair)?: A Little Help needed standing up from a chair using your arms (e.g., wheelchair or bedside chair)?: A Little Help needed to walk in hospital room?: A Little Help needed climbing 3-5 steps with a railing? : A Little 6 Click Score: 18    End of Session Equipment Utilized During Treatment: Gait belt Activity Tolerance: Patient tolerated treatment well Patient left: in chair Nurse Communication: Mobility status PT Visit Diagnosis: Muscle weakness (generalized) (M62.81);History of falling (Z91.81);Difficulty in walking, not elsewhere classified (R26.2);Pain Pain - Right/Left: Left Pain - part of body: Hip    Time: 2229-7989 PT Time Calculation (min) (ACUTE ONLY): 26 min   Charges:   PT Evaluation $PT Eval Low Complexity: 1 Low PT Treatments $Therapeutic Exercise: 8-22 mins        Greggory Stallion, PT, DPT [phone]   Rivaan Kendall 11/14/2019, 12:04 PM

## 2019-11-14 NOTE — Progress Notes (Signed)
Inpatient Diabetes Program Recommendations  AACE/ADA: New Consensus Statement on Inpatient Glycemic Control (2015)  Target Ranges:  Prepandial:   less than 140 mg/dL      Peak postprandial:   less than 180 mg/dL (1-2 hours)      Critically ill patients:  140 - 180 mg/dL   Results for TASNIM, BALENTINE (MRN [id number]) as of 11/14/2019 08:14  Ref. Range 11/13/2019 14:12 11/13/2019 17:12 11/14/2019 08:00  Glucose-Capillary Latest Ref Range: 70 - 99 mg/dL 198 (H) 191 (H) 321 (H)   Admit with: Total Hip  History: DM   Home DM Meds: Metformin 1000 mg BID  Current Orders: Metformin 750 mg BID     MD- Note patient with History of Type 2 Diabetes  CBG 321 mg/dl this AM  Please consider adding Novolog Moderate Correction Scale/ SSI (0-15 units) TID AC + HS while patient awaiting discharge home  (Can use Glycemic Control Order set)     --Will follow patient during hospitalization--  Wyn Quaker RN, MSN, CDE Diabetes Coordinator Inpatient Glycemic Control Team Team Pager: [phone] (8a-5p)

## 2019-11-14 NOTE — Progress Notes (Signed)
Subjective:  Patient reports pain as mild.  Doing well.  Objective:   VITALS:   Vitals:   11/13/19 1905 11/13/19 2145 11/14/19 0001 11/14/19 0352  BP: (!) 153/83 (!) 167/79 (!) 153/68 (!) 167/79  Pulse:  86 86 81  Resp:  16 16 16   Temp:  (!) 97.5 F (36.4 C) (!) 97.5 F (36.4 C) 98.7 F (37.1 C)  TempSrc:   Oral   SpO2:    100%  Weight:   84.8 kg   Height:   5' 3"  (1.6 m)     PHYSICAL EXAM:  Neurologically intact ABD soft Neurovascular intact Sensation intact distally Intact pulses distally Dorsiflexion/Plantar flexion intact Incision: dressing C/D/I No cellulitis present Compartment soft  LABS  Results for orders placed or performed during the hospital encounter of 11/13/19 (from the past 24 hour(s))  Glucose, capillary     Status: Abnormal   Collection Time: 11/13/19  2:12 PM  Result Value Ref Range   Glucose-Capillary 198 (H) 70 - 99 mg/dL  ABO/Rh     Status: None   Collection Time: 11/13/19  2:22 PM  Result Value Ref Range   ABO/RH(D)      O POS Performed at [facility], [address] [place], [place] [address]   I-STAT, [place] 8     Status: Abnormal   Collection Time: 11/13/19  2:33 PM  Result Value Ref Range   Sodium 144 135 - 145 mmol/L   Potassium 3.3 (L) 3.5 - 5.1 mmol/L   Chloride 107 98 - 111 mmol/L   BUN 9 8 - 23 mg/dL   Creatinine, Ser 0.40 (L) 0.44 - 1.00 mg/dL   Glucose, Bld 222 (H) 70 - 99 mg/dL   Calcium, Ion 1.25 1.15 - 1.40 mmol/L   TCO2 23 22 - 32 mmol/L   Hemoglobin 11.9 (L) 12.0 - 15.0 g/dL   HCT 35.0 (L) 36 - 46 %  Glucose, capillary     Status: Abnormal   Collection Time: 11/13/19  5:12 PM  Result Value Ref Range   Glucose-Capillary 191 (H) 70 - 99 mg/dL  CBC     Status: Abnormal   Collection Time: 11/14/19  5:54 AM  Result Value Ref Range   WBC 11.6 (H) 4.0 - 10.5 K/uL   RBC 3.60 (L) 3.87 - 5.11 MIL/uL   Hemoglobin 9.6 (L) 12.0 - 15.0 g/dL   HCT 29.5 (L) 36 - 46 %   MCV 81.9 80.0 - 100.0 fL   MCH 26.7 26.0  - 34.0 pg   MCHC 32.5 30.0 - 36.0 g/dL   RDW 13.3 11.5 - 15.5 %   Platelets 164 150 - 400 K/uL   nRBC 0.0 0.0 - 0.2 %  Basic metabolic panel     Status: Abnormal   Collection Time: 11/14/19  5:54 AM  Result Value Ref Range   Sodium 138 135 - 145 mmol/L   Potassium 3.6 3.5 - 5.1 mmol/L   Chloride 106 98 - 111 mmol/L   CO2 21 (L) 22 - 32 mmol/L   Glucose, Bld 364 (H) 70 - 99 mg/dL   BUN 12 8 - 23 mg/dL   Creatinine, Ser 0.52 0.44 - 1.00 mg/dL   Calcium 9.1 8.9 - 10.3 mg/dL   GFR calc non Af Amer >60 >60 mL/min   GFR calc Af Amer >60 >60 mL/min   Anion gap 11 5 - 15    DG HIP OPERATIVE UNILAT W OR W/O PELVIS LEFT  Result Date:  11/13/2019 CLINICAL DATA:  Left hip arthroplasty EXAM: OPERATIVE LEFT HIP (WITH PELVIS IF PERFORMED) TECHNIQUE: Fluoroscopic spot image(s) were submitted for interpretation post-operatively. COMPARISON:  None. FINDINGS: Seven fluoroscopic spot views obtained in the operating room during left hip arthroplasty. Total fluoroscopy time 11 seconds. Total dose 2.8436 mGy. IMPRESSION: Procedural fluoroscopy during left hip arthroplasty. Electronically Signed   By: Keith Rake M.D.   On: 11/13/2019 19:01    Assessment/Plan: 1 Day Post-Op   Active Problems:   History of total hip replacement, left   Advance diet Up with therapy Discharge home when PT goals met.  WBAT LLE Follow up in 2 weeks for staple removal   [name] , PA-C 11/14/2019, 7:12 AM

## 2019-11-14 NOTE — Progress Notes (Signed)
Pt given discharge instructions and verb understanding. Post op appts and PT appt made. IV d/c.  Home stable with sister

## 2019-11-14 NOTE — Anesthesia Postprocedure Evaluation (Signed)
Anesthesia Post Note  Patient:  Paone Neuhaus  Procedure(s) Performed: TOTAL HIP ARTHROPLASTY ANTERIOR APPROACH (Left Hip)  Patient location during evaluation: Nursing Unit Anesthesia Type: Spinal Level of consciousness: awake, awake and alert, oriented and patient cooperative Pain management: pain level controlled Vital Signs Assessment: post-procedure vital signs reviewed and stable Respiratory status: spontaneous breathing, nonlabored ventilation and respiratory function stable Cardiovascular status: stable Postop Assessment: no headache, no backache, able to ambulate, adequate PO intake, no apparent nausea or vomiting and patient able to bend at knees Anesthetic complications: no   No complications documented.   Last Vitals:  Vitals:   11/14/19 0352 11/14/19 0729  BP: (!) 167/79 (!) 149/80  Pulse: 81 80  Resp: 16 17  Temp: 37.1 C 36.5 C  SpO2: 100% 99%    Last Pain:  Vitals:   11/14/19 0729  TempSrc: Tympanic  PainSc:                  Ricki Miller

## 2019-11-14 NOTE — Progress Notes (Signed)
Physical Therapy Treatment Patient Details Name: Sarah [name] MRN: [id number] DOB: 1955/06/20 Today's Date: 11/14/2019    History of Present Illness Pt admitted for L THR and is POD 1 at time of evaluation. Pt very eager to perform PT.    PT Comments    Pt was sitting in recliner upon arriving. She is A and O x 4 and agreeable to PT session. Was able to stand with CGA for safety and ambulate without LOB with RW. She demonstrated safe ability to ascend/descend 3 steps with +1 RUE support only. Vcs for proper technique and sequencing. Pt has difficulty remembering proper technique so author elected to write sequencing down on HEP handout. Overall she is progressing as per POC. Recommend DC home with HHPT to follow. Will benefit from continued skilled PT to address deficits and assist pt to PLOF. She was sitting in recliner with call bell in reach at conclusion of session.    Follow Up Recommendations  Home health PT     Equipment Recommendations  Other (comment) (pt has personal equipment in room already)    Recommendations for Other Services       Precautions / Restrictions Precautions Precautions: Fall;Anterior Hip Precaution Booklet Issued: Yes (comment) (evaluating therapist issued handout post session) Restrictions Weight Bearing Restrictions: Yes LLE Weight Bearing: Weight bearing as tolerated    Mobility  Bed Mobility Overal bed mobility: Needs Assistance Bed Mobility: Supine to Sit     Supine to sit: Supervision     General bed mobility comments: pt was seated in recliner pre/post session  Transfers Overall transfer level: Needs assistance Equipment used: Rolling walker (2 wheeled) Transfers: Sit to/from Stand Sit to Stand: Supervision;Min guard         General transfer comment: min assist to stand and supervision for stand to sit. Vcs for hand placement  Ambulation/Gait Ambulation/Gait assistance: Min guard Gait Distance (Feet): 25  Feet Assistive device: Rolling walker (2 wheeled) Gait Pattern/deviations: Step-through pattern Gait velocity: minimally decreased   General Gait Details: no LOB or unsteadiness with ambulation with RW   Stairs Stairs: Yes Stairs assistance: Min guard Stair Management: One rail Right;Step to pattern Number of Stairs: 3 General stair comments: Pt was able to perform ascending/descending step 3 x with +1 UE support on R. she has R post to ascend into house. Was able to safely perform but struggles with remembering proper sequencing. Therapist wrote proper sequencing on HEP handout   Wheelchair Mobility    Modified Rankin (Stroke Patients Only)       Balance Overall balance assessment: Needs assistance Sitting-balance support: Feet supported Sitting balance-Leahy Scale: Good     Standing balance support: Bilateral upper extremity supported Standing balance-Leahy Scale: Good Standing balance comment: no LOB in standing with +1/+1 UE support                            Cognition Arousal/Alertness: Awake/alert Behavior During Therapy: WFL for tasks assessed/performed Overall Cognitive Status: Within Functional Limits for tasks assessed                                 General Comments: Pt is A and Oriented but unable to recall precautions      Exercises Other Exercises Other Exercises: supine ther-ex performed on L LE including AP, QS, GS, and hip abd/add. All ther-ex performed x 12 reps with cga. Cues for  sequencing    General Comments        Pertinent Vitals/Pain Pain Assessment: No/denies pain Pain Score: 0-No pain Pain Intervention(s): Limited activity within patient's tolerance;Monitored during session;Premedicated before session;Repositioned;Ice applied    Home Living Family/patient expects to be discharged to:: Private residence Living Arrangements: Spouse/significant other (boyfriend) Available Help at Discharge: Family;Friend(s)  (boyfriend & sister both avail. PRN) Type of Home: House Home Access: Stairs to enter Entrance Stairs-Rails:  (uses posts, however no formalized railing) Home Layout: One level Home Equipment: Mining engineer - 2 wheels      Prior Function Level of Independence: Independent with assistive device(s)      Comments: using QC for mobility prior to Sx. Reports 1 recent fall   PT Goals (current goals can now be found in the care plan section) Acute Rehab PT Goals Patient Stated Goal: to go home PT Goal Formulation: With patient Time For Goal Achievement: 11/28/19 Potential to Achieve Goals: Good Additional Goals Additional Goal #1: Pt will be able to perform bed mobility/transfers with supervision and safe technique in order to improve functional independence Progress towards PT goals: Progressing toward goals    Frequency    BID      PT Plan Current plan remains appropriate    Co-evaluation              AM-PAC PT "6 Clicks" Mobility   Outcome Measure  Help needed turning from your back to your side while in a flat bed without using bedrails?: A Little Help needed moving from lying on your back to sitting on the side of a flat bed without using bedrails?: A Little Help needed moving to and from a bed to a chair (including a wheelchair)?: A Little Help needed standing up from a chair using your arms (e.g., wheelchair or bedside chair)?: A Little Help needed to walk in hospital room?: A Little Help needed climbing 3-5 steps with a railing? : A Little 6 Click Score: 18    End of Session Equipment Utilized During Treatment: Gait belt Activity Tolerance: Patient tolerated treatment well Patient left: in chair;with call bell/phone within reach;with nursing/sitter in room Nurse Communication: Mobility status PT Visit Diagnosis: Muscle weakness (generalized) (M62.81);History of falling (Z91.81);Difficulty in walking, not elsewhere classified (R26.2);Pain Pain -  Right/Left: Left Pain - part of body: Hip     Time: 3151-7616 PT Time Calculation (min) (ACUTE ONLY): 17 min  Charges:  $Gait Training: 8-22 mins $Therapeutic Exercise: 8-22 mins                     Julaine Fusi PTA 11/14/19, 2:05 PM

## 2019-11-14 NOTE — Discharge Instructions (Signed)

## 2019-11-14 NOTE — Discharge Summary (Signed)
Physician Discharge Summary  Patient ID: Sarah [name] MRN: [id number] DOB/AGE: 08/27/1955 64 y.o.  Admit date: 11/13/2019 Discharge date: 11/14/2019  Admission Diagnoses:  M16.12 Unilateral primary osteoarthritis, left hip <principal problem not specified>  Discharge Diagnoses:  M16.12 Unilateral primary osteoarthritis, left hip Active Problems:   History of total hip replacement, left   Past Medical History:  Diagnosis Date  . Arthritis   . Diabetes mellitus without complication ([place])   . Hyperlipidemia   . Hypertension   . Hyperthyroidism   . Thyroid disease     Surgeries: Procedure(s): TOTAL HIP ARTHROPLASTY ANTERIOR APPROACH on 11/13/2019   Consultants (if any):   Discharged Condition: Improved  Hospital Course: Sarah [name] is an 64 y.o. female who was admitted 11/13/2019 with a diagnosis of  M16.12 Unilateral primary osteoarthritis, left hip <principal problem not specified> and went to the operating room on 11/13/2019 and underwent the above named procedures.    She was given perioperative antibiotics:  Anti-infectives (From admission, onward)   Start     Dose/Rate Route Frequency Ordered Stop   11/14/19 0600  ceFAZolin (ANCEF) IVPB 2g/100 mL premix        2 g 200 mL/hr over 30 Minutes Intravenous On call to O.R. 11/13/19 1419 11/13/19 1529   11/14/19 0331  ceFAZolin (ANCEF) 2-4 GM/100ML-% IVPB       Note to Pharmacy: Sylvester Harder   : cabinet override      11/14/19 0331 11/14/19 1544   11/13/19 2100  ceFAZolin (ANCEF) IVPB 2g/100 mL premix        2 g 200 mL/hr over 30 Minutes Intravenous Every 6 hours 11/13/19 1819 11/14/19 0404   11/13/19 1545  50,000 units bacitracin in 0.9% normal saline 250 mL irrigation  Status:  Discontinued          As needed 11/13/19 1545 11/13/19 1657   11/13/19 1429  ceFAZolin (ANCEF) 2-4 GM/100ML-% IVPB       Note to Pharmacy: Dewayne Hatch   : cabinet override      11/13/19 1429 11/14/19 0404    .  She was  given sequential compression devices, early ambulation, and aspirin for DVT prophylaxis.  She benefited maximally from the hospital stay and there were no complications.    Recent vital signs:  Vitals:   11/14/19 0352 11/14/19 0729  BP: (!) 167/79 (!) 149/80  Pulse: 81 80  Resp: 16 17  Temp: 98.7 F (37.1 C) 97.7 F (36.5 C)  SpO2: 100% 99%    Recent laboratory studies:  Lab Results  Component Value Date   HGB 9.6 (L) 11/14/2019   HGB 11.9 (L) 11/13/2019   HGB 11.0 (L) 11/08/2019   Lab Results  Component Value Date   WBC 11.6 (H) 11/14/2019   PLT 164 11/14/2019   Lab Results  Component Value Date   INR 1.1 11/08/2019   Lab Results  Component Value Date   NA 138 11/14/2019   K 3.6 11/14/2019   CL 106 11/14/2019   CO2 21 (L) 11/14/2019   BUN 12 11/14/2019   CREATININE 0.52 11/14/2019   GLUCOSE 364 (H) 11/14/2019    Discharge Medications:   Allergies as of 11/14/2019      Reactions   Ace Inhibitors Nausea Only   Farxiga [dapagliflozin] Other (See Comments)   Vaginitis      Medication List    STOP taking these medications   ibuprofen 200 MG tablet Commonly known as: ADVIL   meloxicam 15 MG tablet  Commonly known as: MOBIC     TAKE these medications   amLODipine 5 MG tablet Commonly known as: NORVASC Take 1 tablet (5 mg total) by mouth daily.   aspirin 81 MG chewable tablet Chew 1 tablet (81 mg total) by mouth 2 (two) times daily.   atorvastatin 10 MG tablet Commonly known as: LIPITOR Take 1 tablet (10 mg total) by mouth daily.   docusate sodium 100 MG capsule Commonly known as: COLACE Take 1 capsule (100 mg total) by mouth 2 (two) times daily.   HYDROcodone-acetaminophen 5-325 MG tablet Commonly known as: NORCO/VICODIN Take 1 tablet by mouth every 4 (four) hours as needed for moderate pain (pain).   losartan 50 MG tablet Commonly known as: COZAAR Take 1 tablet (50 mg total) by mouth daily. What changed: how much to take   metFORMIN 500  MG tablet Commonly known as: GLUCOPHAGE Take 1.5 tablets (750 mg total) by mouth 2 (two) times daily with a meal. What changed: how much to take   methimazole 10 MG tablet Commonly known as: TAPAZOLE Take 0.5 tablets (5 mg total) by mouth daily.   methocarbamol 500 MG tablet Commonly known as: ROBAXIN Take 500 mg by mouth 3 (three) times daily.   metoprolol tartrate 25 MG tablet Commonly known as: LOPRESSOR TAKE ONE TABLET BY MOUTH 2 TIMES A DAY What changed:   how much to take  how to take this  when to take this  additional instructions   ondansetron 4 MG tablet Commonly known as: ZOFRAN Take 1 tablet (4 mg total) by mouth every 6 (six) hours as needed for nausea.   traMADol 50 MG tablet Commonly known as: ULTRAM Take 50 mg by mouth every 6 (six) hours as needed for moderate pain.            Durable Medical Equipment  (From admission, onward)         Start     Ordered   11/14/19 0929  For home use only DME 3 n 1  Once        11/14/19 0929   11/14/19 0929  For home use only DME Walker rolling  Once       Question Answer Comment  Walker: With 5 Inch Wheels   Patient needs a walker to treat with the following condition Osteoarthritis of left hip      11/14/19 0929   11/13/19 1820  DME Walker rolling  Once       Question:  Patient needs a walker to treat with the following condition  Answer:  History of total hip replacement, left   11/13/19 1819   11/13/19 1820  DME 3 n 1  Once        11/13/19 1819   11/13/19 1820  DME Bedside commode  Once       Question:  Patient needs a bedside commode to treat with the following condition  Answer:  History of total hip replacement, left   11/13/19 1819          Diagnostic Studies: DG HIP OPERATIVE UNILAT W OR W/O PELVIS LEFT  Result Date: 11/13/2019 CLINICAL DATA:  Left hip arthroplasty EXAM: OPERATIVE LEFT HIP (WITH PELVIS IF PERFORMED) TECHNIQUE: Fluoroscopic spot image(s) were submitted for interpretation  post-operatively. COMPARISON:  None. FINDINGS: Seven fluoroscopic spot views obtained in the operating room during left hip arthroplasty. Total fluoroscopy time 11 seconds. Total dose 2.8436 mGy. IMPRESSION: Procedural fluoroscopy during left hip arthroplasty. Electronically Signed   By: Threasa Beards  Sanford M.D.   On: 11/13/2019 19:01    Disposition: Discharge disposition: 01-Home or Self Care      D/C home once PT goals met WBAT LLE Follow up in 2 weeks for staple removal  Call to confirm appointment [phone]      Signed: [name] ,PA-C 11/14/2019, 9:36 AM

## 2019-11-14 NOTE — TOC Transition Note (Signed)
Transition of Care [facility]) - CM/SW Discharge Note   Patient Details  Name: Sarah [name] MRN: [id number] Date of Birth: 06/28/1955  [facility] of Care [facility]) CM/SW Contact:  Ova Freshwater Phone Number:  [phone] 11/14/2019, 11:40 AM   Clinical Narrative:     CSW received call from Peri-Op floor for patient who is ready to d/c, DME, walker and bedside commode.  CSW contacted Zack at [facility] and he will get the equipment to the patient.  TOC consult complete.        Patient Goals and CMS Choice        Discharge Placement                       Discharge Plan and Services                                     Social Determinants of Health (SDOH) Interventions     Readmission Risk Interventions No flowsheet data found.

## 2019-11-15 LAB — GLUCOSE, CAPILLARY: Glucose-Capillary: 318 mg/dL — ABNORMAL HIGH (ref 70–99)

## 2019-11-20 DIAGNOSIS — M25552 Pain in left hip: Secondary | ICD-10-CM | POA: Diagnosis not present

## 2019-11-20 DIAGNOSIS — M25652 Stiffness of left hip, not elsewhere classified: Secondary | ICD-10-CM | POA: Diagnosis not present

## 2019-11-27 NOTE — H&P (Signed)
NAME: Sarah [name] MRN:   [id number] DOB:   12/30/1955     HISTORY AND PHYSICAL  CHIEF COMPLAINT:  Left hip pain  HISTORY:   Sarah Welling Snipesis a 64 y.o. female  with left  Hip Pain Patient complains of left hip pain. Onset of the symptoms was several years ago. Inciting event: none. The patient reports the hip pain is worse with weight bearing. Associated symptoms: none. Aggravating symptoms include: any weight bearing, going up and down stairs and inactivity. Patient has had no prior hip problems. Previous visits for this problem: yes, last seen several weeks ago by me. Evaluation to date: plain films, which were abnormal  osteoarthritis. Treatment to date: OTC analgesics, which have been somewhat effective, prescription analgesics, which have been somewhat effective, home exercise program, which has been somewhat effective and physical therapy, which has been somewhat effective.  Plan for left total hip replacement  PAST MEDICAL HISTORY:   Past Medical History:  Diagnosis Date  . Arthritis   . Diabetes mellitus without complication ([place])   . Hyperlipidemia   . Hypertension   . Hyperthyroidism   . Thyroid disease     PAST SURGICAL HISTORY:   Past Surgical History:  Procedure Laterality Date  . ABDOMINAL HYSTERECTOMY    . AMPUTATION Left 12/15/2017   Procedure: AMPUTATION DIGIT left long finger;  Surgeon: Dereck Leep, MD;  Location: [facility] ORS;  Service: Orthopedics;  Laterality: Left;  . PARTIAL HYSTERECTOMY    . TOTAL HIP ARTHROPLASTY Left 11/13/2019   Procedure: TOTAL HIP ARTHROPLASTY ANTERIOR APPROACH;  Surgeon: Lovell Sheehan, MD;  Location: [facility] ORS;  Service: Orthopedics;  Laterality: Left;    MEDICATIONS:   No medications prior to admission.    ALLERGIES:   Allergies  Allergen Reactions  . Ace Inhibitors Nausea Only  . Wilder Glade [Dapagliflozin] Other (See Comments)    Vaginitis     REVIEW OF SYSTEMS:   Negative except HPI  FAMILY HISTORY:   Family  History  Problem Relation Age of Onset  . Diabetes Mother   . Diabetes Sister   . Stroke Sister   . Breast cancer Neg Hx     SOCIAL HISTORY:   reports that she quit smoking about 12 months ago. Her smoking use included cigarettes. She has never used smokeless tobacco. She reports that she does not drink alcohol and does not use drugs.  PHYSICAL EXAM:  General appearance: alert, cooperative and no distress Neck: no JVD and supple, symmetrical, trachea midline Resp: clear to auscultation bilaterally Cardio: regular rate and rhythm, S1, S2 normal, no murmur, click, rub or gallop Extremities: extremities normal, atraumatic, no cyanosis or edema and Homans sign is negative, no sign of DVT Pulses: 2+ and symmetric Skin: Skin color, texture, turgor normal. No rashes or lesions Neurologic: Alert and oriented X 3, normal strength and tone. Normal symmetric reflexes. Normal coordination and gait    LABORATORY STUDIES: No results for input(s): WBC, HGB, HCT, PLT in the last 72 hours.  No results for input(s): NA, K, CL, CO2, GLUCOSE, BUN, CREATININE, CALCIUM in the last 72 hours.  STUDIES/RESULTS:  DG HIP OPERATIVE UNILAT W OR W/O PELVIS LEFT  Result Date: 11/13/2019 CLINICAL DATA:  Left hip arthroplasty EXAM: OPERATIVE LEFT HIP (WITH PELVIS IF PERFORMED) TECHNIQUE: Fluoroscopic spot image(s) were submitted for interpretation post-operatively. COMPARISON:  None. FINDINGS: Seven fluoroscopic spot views obtained in the operating room during left hip arthroplasty. Total fluoroscopy time 11 seconds. Total dose 2.8436 mGy. IMPRESSION: Procedural  fluoroscopy during left hip arthroplasty. Electronically Signed   By: Keith Rake M.D.   On: 11/13/2019 19:01    ASSESSMENT:  End stage osteoarthritis left hip        Active Problems:   History of total hip replacement, left    PLAN:  Left Primary Total Hip   [name] 11/27/2019. 3:06 PM

## 2019-12-04 DIAGNOSIS — M25552 Pain in left hip: Secondary | ICD-10-CM | POA: Diagnosis not present

## 2019-12-04 DIAGNOSIS — M25652 Stiffness of left hip, not elsewhere classified: Secondary | ICD-10-CM | POA: Diagnosis not present

## 2019-12-04 DIAGNOSIS — R600 Localized edema: Secondary | ICD-10-CM | POA: Diagnosis not present

## 2019-12-06 DIAGNOSIS — H40009 Preglaucoma, unspecified, unspecified eye: Secondary | ICD-10-CM | POA: Diagnosis not present

## 2019-12-06 DIAGNOSIS — E113291 Type 2 diabetes mellitus with mild nonproliferative diabetic retinopathy without macular edema, right eye: Secondary | ICD-10-CM | POA: Diagnosis not present

## 2019-12-10 DIAGNOSIS — M25552 Pain in left hip: Secondary | ICD-10-CM | POA: Diagnosis not present

## 2019-12-10 DIAGNOSIS — M25652 Stiffness of left hip, not elsewhere classified: Secondary | ICD-10-CM | POA: Diagnosis not present

## 2019-12-12 DIAGNOSIS — M25552 Pain in left hip: Secondary | ICD-10-CM | POA: Diagnosis not present

## 2019-12-12 DIAGNOSIS — M25652 Stiffness of left hip, not elsewhere classified: Secondary | ICD-10-CM | POA: Diagnosis not present

## 2019-12-17 DIAGNOSIS — M25552 Pain in left hip: Secondary | ICD-10-CM | POA: Diagnosis not present

## 2019-12-17 DIAGNOSIS — Z96642 Presence of left artificial hip joint: Secondary | ICD-10-CM | POA: Diagnosis not present

## 2019-12-17 DIAGNOSIS — M25652 Stiffness of left hip, not elsewhere classified: Secondary | ICD-10-CM | POA: Diagnosis not present

## 2019-12-19 DIAGNOSIS — I10 Essential (primary) hypertension: Secondary | ICD-10-CM | POA: Diagnosis not present

## 2019-12-19 DIAGNOSIS — E119 Type 2 diabetes mellitus without complications: Secondary | ICD-10-CM | POA: Diagnosis not present

## 2020-01-02 DIAGNOSIS — E119 Type 2 diabetes mellitus without complications: Secondary | ICD-10-CM | POA: Diagnosis not present

## 2020-01-02 DIAGNOSIS — Z23 Encounter for immunization: Secondary | ICD-10-CM | POA: Diagnosis not present

## 2020-01-02 DIAGNOSIS — I10 Essential (primary) hypertension: Secondary | ICD-10-CM | POA: Diagnosis not present

## 2020-01-28 DIAGNOSIS — E119 Type 2 diabetes mellitus without complications: Secondary | ICD-10-CM | POA: Diagnosis not present

## 2020-01-28 DIAGNOSIS — I10 Essential (primary) hypertension: Secondary | ICD-10-CM | POA: Diagnosis not present

## 2020-02-11 DIAGNOSIS — E119 Type 2 diabetes mellitus without complications: Secondary | ICD-10-CM | POA: Diagnosis not present

## 2020-02-11 DIAGNOSIS — R7309 Other abnormal glucose: Secondary | ICD-10-CM | POA: Diagnosis not present

## 2020-02-19 DIAGNOSIS — I10 Essential (primary) hypertension: Secondary | ICD-10-CM | POA: Diagnosis not present

## 2020-02-19 DIAGNOSIS — E119 Type 2 diabetes mellitus without complications: Secondary | ICD-10-CM | POA: Diagnosis not present

## 2020-03-03 DIAGNOSIS — I10 Essential (primary) hypertension: Secondary | ICD-10-CM | POA: Diagnosis not present

## 2020-03-03 DIAGNOSIS — E0590 Thyrotoxicosis, unspecified without thyrotoxic crisis or storm: Secondary | ICD-10-CM | POA: Diagnosis not present

## 2020-03-09 ENCOUNTER — Other Ambulatory Visit: Payer: Self-pay | Admitting: Physician Assistant

## 2020-03-09 DIAGNOSIS — Z1231 Encounter for screening mammogram for malignant neoplasm of breast: Secondary | ICD-10-CM

## 2020-03-23 ENCOUNTER — Ambulatory Visit
Admission: RE | Admit: 2020-03-23 | Discharge: 2020-03-23 | Disposition: A | Discharge status: Home / Self Care | Payer: Medicare HMO | Source: Ambulatory Visit | Attending: Physician Assistant | Admitting: Physician Assistant

## 2020-03-23 ENCOUNTER — Other Ambulatory Visit: Payer: Self-pay

## 2020-03-23 DIAGNOSIS — Z1231 Encounter for screening mammogram for malignant neoplasm of breast: Secondary | ICD-10-CM | POA: Insufficient documentation

## 2020-04-06 ENCOUNTER — Other Ambulatory Visit: Payer: Medicaid Other | Admitting: Pharmacist

## 2020-04-06 DIAGNOSIS — H40003 Preglaucoma, unspecified, bilateral: Secondary | ICD-10-CM | POA: Diagnosis not present

## 2020-04-07 DIAGNOSIS — I10 Essential (primary) hypertension: Secondary | ICD-10-CM | POA: Diagnosis not present

## 2020-04-07 DIAGNOSIS — E0590 Thyrotoxicosis, unspecified without thyrotoxic crisis or storm: Secondary | ICD-10-CM | POA: Diagnosis not present

## 2020-04-07 DIAGNOSIS — Z23 Encounter for immunization: Secondary | ICD-10-CM | POA: Diagnosis not present

## 2020-06-02 DIAGNOSIS — E119 Type 2 diabetes mellitus without complications: Secondary | ICD-10-CM | POA: Diagnosis not present

## 2020-06-02 DIAGNOSIS — I10 Essential (primary) hypertension: Secondary | ICD-10-CM | POA: Diagnosis not present

## 2020-06-03 DIAGNOSIS — E119 Type 2 diabetes mellitus without complications: Secondary | ICD-10-CM | POA: Diagnosis not present

## 2020-06-03 DIAGNOSIS — I10 Essential (primary) hypertension: Secondary | ICD-10-CM | POA: Diagnosis not present

## 2020-06-12 DIAGNOSIS — E119 Type 2 diabetes mellitus without complications: Secondary | ICD-10-CM | POA: Diagnosis not present

## 2020-06-12 DIAGNOSIS — I10 Essential (primary) hypertension: Secondary | ICD-10-CM | POA: Diagnosis not present

## 2020-07-14 DIAGNOSIS — I10 Essential (primary) hypertension: Secondary | ICD-10-CM | POA: Diagnosis not present

## 2020-07-14 DIAGNOSIS — E119 Type 2 diabetes mellitus without complications: Secondary | ICD-10-CM | POA: Diagnosis not present

## 2020-07-16 DIAGNOSIS — I10 Essential (primary) hypertension: Secondary | ICD-10-CM | POA: Diagnosis not present

## 2020-07-16 DIAGNOSIS — E119 Type 2 diabetes mellitus without complications: Secondary | ICD-10-CM | POA: Diagnosis not present

## 2020-08-05 ENCOUNTER — Encounter: Payer: Self-pay | Admitting: Emergency Medicine

## 2020-08-05 ENCOUNTER — Other Ambulatory Visit: Payer: Self-pay

## 2020-08-05 ENCOUNTER — Emergency Department
Admission: EM | Admit: 2020-08-05 | Discharge: 2020-08-05 | Disposition: A | Discharge status: Home / Self Care | Payer: Medicare Other | Attending: Emergency Medicine | Admitting: Emergency Medicine

## 2020-08-05 ENCOUNTER — Emergency Department: Payer: Medicare Other

## 2020-08-05 DIAGNOSIS — I10 Essential (primary) hypertension: Secondary | ICD-10-CM | POA: Insufficient documentation

## 2020-08-05 DIAGNOSIS — Z79899 Other long term (current) drug therapy: Secondary | ICD-10-CM | POA: Diagnosis not present

## 2020-08-05 DIAGNOSIS — K5750 Diverticulosis of both small and large intestine without perforation or abscess without bleeding: Secondary | ICD-10-CM | POA: Diagnosis not present

## 2020-08-05 DIAGNOSIS — Z7984 Long term (current) use of oral hypoglycemic drugs: Secondary | ICD-10-CM | POA: Insufficient documentation

## 2020-08-05 DIAGNOSIS — R1011 Right upper quadrant pain: Secondary | ICD-10-CM | POA: Diagnosis not present

## 2020-08-05 DIAGNOSIS — E119 Type 2 diabetes mellitus without complications: Secondary | ICD-10-CM | POA: Insufficient documentation

## 2020-08-05 DIAGNOSIS — Z96642 Presence of left artificial hip joint: Secondary | ICD-10-CM | POA: Insufficient documentation

## 2020-08-05 DIAGNOSIS — Z7982 Long term (current) use of aspirin: Secondary | ICD-10-CM | POA: Insufficient documentation

## 2020-08-05 DIAGNOSIS — R109 Unspecified abdominal pain: Secondary | ICD-10-CM | POA: Diagnosis present

## 2020-08-05 DIAGNOSIS — N1330 Unspecified hydronephrosis: Secondary | ICD-10-CM | POA: Diagnosis not present

## 2020-08-05 DIAGNOSIS — N200 Calculus of kidney: Secondary | ICD-10-CM | POA: Diagnosis not present

## 2020-08-05 DIAGNOSIS — N201 Calculus of ureter: Secondary | ICD-10-CM | POA: Diagnosis not present

## 2020-08-05 DIAGNOSIS — I700 Atherosclerosis of aorta: Secondary | ICD-10-CM | POA: Diagnosis not present

## 2020-08-05 DIAGNOSIS — Z87891 Personal history of nicotine dependence: Secondary | ICD-10-CM | POA: Diagnosis not present

## 2020-08-05 LAB — URINALYSIS, COMPLETE (UACMP) WITH MICROSCOPIC
Bacteria, UA: NONE SEEN
Bilirubin Urine: NEGATIVE
Glucose, UA: >=500 mg/dL — AB
Ketones, ur: NEGATIVE mg/dL
Nitrite: NEGATIVE
Protein, ur: NEGATIVE mg/dL
Specific Gravity, Urine: 1.025 (ref 1.005–1.030)
pH: 5 (ref 5.0–8.0)

## 2020-08-05 LAB — HEPATIC FUNCTION PANEL
ALT: 13 U/L (ref 0–44)
AST: 18 U/L (ref 15–41)
Albumin: 3.7 g/dL (ref 3.5–5.0)
Alkaline Phosphatase: 123 U/L (ref 38–126)
Bilirubin, Direct: 0.1 mg/dL (ref 0.0–0.2)
Indirect Bilirubin: 0.8 mg/dL (ref 0.3–0.9)
Total Bilirubin: 0.9 mg/dL (ref 0.3–1.2)
Total Protein: 7.1 g/dL (ref 6.5–8.1)

## 2020-08-05 LAB — CBC
HCT: 36.8 % (ref 36.0–46.0)
Hemoglobin: 12.2 g/dL (ref 12.0–15.0)
MCH: 26.6 pg (ref 26.0–34.0)
MCHC: 33.2 g/dL (ref 30.0–36.0)
MCV: 80.2 fL (ref 80.0–100.0)
Platelets: 187 10*3/uL (ref 150–400)
RBC: 4.59 MIL/uL (ref 3.87–5.11)
RDW: 13.3 % (ref 11.5–15.5)
WBC: 7.8 10*3/uL (ref 4.0–10.5)
nRBC: 0 % (ref 0.0–0.2)

## 2020-08-05 LAB — BASIC METABOLIC PANEL
Anion gap: 10 (ref 5–15)
BUN: 14 mg/dL (ref 8–23)
CO2: 23 mmol/L (ref 22–32)
Calcium: 9.5 mg/dL (ref 8.9–10.3)
Chloride: 105 mmol/L (ref 98–111)
Creatinine, Ser: 0.56 mg/dL (ref 0.44–1.00)
GFR, Estimated: >60 mL/min (ref >60)
Glucose, Bld: 240 mg/dL — ABNORMAL HIGH (ref 70–99)
Potassium: 3.4 mmol/L — ABNORMAL LOW (ref 3.5–5.1)
Sodium: 138 mmol/L (ref 135–145)

## 2020-08-05 MED ORDER — TAMSULOSIN HCL 0.4 MG PO CAPS: 0.4000 mg | ORAL_CAPSULE | Freq: Every day | ORAL | 0 refills | Status: DC

## 2020-08-05 MED ORDER — HYDROCODONE-ACETAMINOPHEN 5-325 MG PO TABS: 1.0000 | ORAL_TABLET | Freq: Four times a day (QID) | ORAL | 0 refills | Status: DC | PRN

## 2020-08-05 NOTE — ED Notes (Signed)
Patient transported to CT 

## 2020-08-05 NOTE — ED Provider Notes (Signed)
[facility] Emergency Department Provider Note  ____________________________________________   Event Date/Time   First MD Initiated Contact with Patient 08/05/20 1128     (approximate)  I have reviewed the triage vital signs and the nursing notes.   HISTORY  Chief Complaint Flank Pain    HPI Sarah [name] is a 65 y.o. female presents emergency department complaining of right-sided flank pain for 2 to 3 days.  Patient states she recently started [place].  She is unsure if her pain is related to the medication.  States the pain is sharp and is under the right flank area.  She has had no rash.  No fever or chills.  No vomiting or diarrhea.  Had normal bowel movement earlier today.  Patient does still have a gallbladder and appendix.  She has history of diabetes and hypertension    Past Medical History:  Diagnosis Date  . Arthritis   . Diabetes mellitus without complication ([place])   . Hyperlipidemia   . Hypertension   . Hyperthyroidism   . Thyroid disease     Patient Active Problem List   Diagnosis Date Noted  . History of total hip replacement, left 11/13/2019  . Ophthalmic manifestation of Graves disease 07/25/2018  . Finger wound, simple, open, subsequent encounter 12/14/2017  . Felon of finger of left hand 12/14/2017  . 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 Primary osteoarthritis of left hip 02/28/2017  . Primary osteoarthritis of left knee 02/28/2017  . Hyperlipidemia associated with type 2 diabetes mellitus ([place]) 03/05/2015  . Biceps tendinitis 08/10/2014  . Essential (primary) hypertension 08/10/2014  . Hyperthyroidism 08/10/2014  . Uncontrolled type 2 diabetes mellitus without complication, without long-term current use of insulin 08/10/2014    Past Surgical History:  Procedure Laterality Date  . ABDOMINAL HYSTERECTOMY    . AMPUTATION Left 12/15/2017   Procedure: AMPUTATION DIGIT left long finger;  Surgeon:  Dereck Leep, MD;  Location: [facility] ORS;  Service: Orthopedics;  Laterality: Left;  . PARTIAL HYSTERECTOMY    . TOTAL HIP ARTHROPLASTY Left 11/13/2019   Procedure: TOTAL HIP ARTHROPLASTY ANTERIOR APPROACH;  Surgeon: Lovell Sheehan, MD;  Location: [facility] ORS;  Service: Orthopedics;  Laterality: Left;    Prior to Admission medications   Medication Sig Start Date End Date Taking? Authorizing Provider  HYDROcodone-acetaminophen (NORCO/VICODIN) 5-325 MG tablet Take 1 tablet by mouth every 6 (six) hours as needed for moderate pain. 08/05/20  Yes Takari Lundahl, Linden Dolin, PA-C  tamsulosin (FLOMAX) 0.4 MG CAPS capsule Take 1 capsule (0.4 mg total) by mouth daily. 08/05/20  Yes Lateria Alderman, Linden Dolin, PA-C  amLODipine (NORVASC) 5 MG tablet Take 1 tablet (5 mg total) by mouth daily. 09/17/19 12/16/19  Tawni Millers, MD  aspirin 81 MG chewable tablet Chew 1 tablet (81 mg total) by mouth 2 (two) times daily. 11/14/19   [name], PA-C  atorvastatin (LIPITOR) 10 MG tablet Take 1 tablet (10 mg total) by mouth daily. 09/11/19 11/08/19  Tawni Millers, MD  docusate sodium (COLACE) 100 MG capsule Take 1 capsule (100 mg total) by mouth 2 (two) times daily. 11/14/19   [name], PA-C  losartan (COZAAR) 50 MG tablet Take 1 tablet (50 mg total) by mouth daily. Patient taking differently: Take 25 mg by mouth daily.  09/17/19   Tawni Millers, MD  metFORMIN (GLUCOPHAGE) 500 MG tablet Take 1.5 tablets (750 mg total) by mouth 2 (two) times daily with a meal. Patient taking differently:  Take 1,000 mg by mouth 2 (two) times daily with a meal.  09/11/19 12/10/19  Tawni Millers, MD  methimazole (TAPAZOLE) 10 MG tablet Take 0.5 tablets (5 mg total) by mouth daily. 09/17/19 12/16/19  Tawni Millers, MD  methocarbamol (ROBAXIN) 500 MG tablet Take 500 mg by mouth 3 (three) times daily. 10/21/19   [provider]  metoprolol tartrate (LOPRESSOR) 25 MG tablet TAKE ONE TABLET BY MOUTH 2 TIMES A DAY Patient taking differently: Take 25 mg by  mouth daily.  09/11/19   Tawni Millers, MD  ondansetron (ZOFRAN) 4 MG tablet Take 1 tablet (4 mg total) by mouth every 6 (six) hours as needed for nausea. 11/14/19   [name], PA-C  traMADol (ULTRAM) 50 MG tablet Take 50 mg by mouth every 6 (six) hours as needed for moderate pain.  10/21/19   [provider]    Allergies Ace inhibitors and Farxiga [dapagliflozin]  Family History  Problem Relation Age of Onset  . Diabetes Mother   . Diabetes Sister   . Stroke Sister   . Breast cancer Neg Hx     Social History Social History   Tobacco Use  . Smoking status: Former Smoker    Types: Cigarettes    Quit date: 11/08/2018    Years since quitting: 1.7  . Smokeless tobacco: Never Used  Vaping Use  . Vaping Use: Never used  Substance Use Topics  . Alcohol use: No    Alcohol/week: 0.0 standard drinks  . Drug use: No    Review of Systems  Constitutional: No fever/chills Eyes: No visual changes. ENT: No sore throat. Respiratory: Denies cough Cardiovascular: Denies chest pain Gastrointestinal: Positive abdominal pain Genitourinary: Negative for dysuria. Musculoskeletal: Negative for back pain. Skin: Negative for rash. Psychiatric: no mood changes,     ____________________________________________   PHYSICAL EXAM:  VITAL SIGNS: ED Triage Vitals  Enc Vitals Group     BP 08/05/20 1130 (!) 142/76     Pulse Rate 08/05/20 1130 90     Resp 08/05/20 1130 17     Temp 08/05/20 1130 97.8 F (36.6 C)     Temp Source 08/05/20 1130 Oral     SpO2 08/05/20 1130 100 %     Weight 08/05/20 1129 170 lb (77.1 kg)     Height 08/05/20 1129 5\' 4"  (1.626 m)     Head Circumference --      Peak Flow --      Pain Score 08/05/20 1128 10     Pain Loc --      Pain Edu? --      Excl. in [place]? --     Constitutional: Alert and oriented. Well appearing and in no acute distress. Eyes: Conjunctivae are normal.  Head: Atraumatic. Nose: No congestion/rhinnorhea. Mouth/Throat: Mucous  membranes are moist.   Neck:  supple no lymphadenopathy noted Cardiovascular: Normal rate, regular rhythm. Heart sounds are normal Respiratory: Normal respiratory effort.  No retractions, lungs c t a  Abd: soft tender in the right upper quadrant, bs normal all 4 quad GU: deferred Musculoskeletal: FROM all extremities, warm and well perfused Neurologic:  Normal speech and language.  Skin:  Skin is warm, dry and intact. No rash noted. Psychiatric: Mood and affect are normal. Speech and behavior are normal.  ____________________________________________   LABS (all labs ordered are listed, but only abnormal results are displayed)  Labs Reviewed  URINALYSIS, COMPLETE (UACMP) WITH MICROSCOPIC - Abnormal; Notable for the following components:  Result Value   Color, Urine YELLOW (*)    APPearance CLEAR (*)    Glucose, UA >=500 (*)    Hgb urine dipstick SMALL (*)    Leukocytes,Ua TRACE (*)    All other components within normal limits  BASIC METABOLIC PANEL - Abnormal; Notable for the following components:   Potassium 3.4 (*)    Glucose, Bld 240 (*)    All other components within normal limits  CBC  HEPATIC FUNCTION PANEL   ____________________________________________   ____________________________________________  RADIOLOGY  Ultrasound right upper quadrant CT renal stone ____________________________________________   PROCEDURES  Procedure(s) performed: No  Procedures    ____________________________________________   INITIAL IMPRESSION / ASSESSMENT AND PLAN / ED COURSE  Pertinent labs & imaging results that were available during my care of the patient were reviewed by me and considered in my medical decision making (see chart for details).   The patient is a 65 year old female presents with right upper quadrant pain/flank pain.  See HPI physical exam shows patient per stable at this time.  DDx: Acute cholecystitis, kidney stone, UTI, pyelonephritis,  appendicitis  CBC is normal, basic metabolic panel is normal except for her glucose is elevated at 240, ua shows glucose, trace leuks   Ultrasound did not show any gallstones, does show hydronephrosis,  CT reviewed by me, radiology to have a 1.2 cm stone.  Radiologist comments on severe hydronephrosis.  Consult with urology.  Dr. Erlene Quan.  Patient Friday.  Patient was given pain medication and Flomax.  She was encouraged to drink water.  Follow-up with Dr. Erlene Quan as instructed.  Return if worsening.  Discharged in stable condition.  Guida Asman was evaluated in Emergency Department on 08/05/2020 for the symptoms described in the history of present illness. She was evaluated in the context of the global COVID-19 pandemic, which necessitated consideration that the patient might be at risk for infection with the SARS-CoV-2 virus that causes COVID-19. Institutional protocols and algorithms that pertain to the evaluation of patients at risk for COVID-19 are in a state of rapid change based on information released by regulatory bodies including the CDC and federal and state organizations. These policies and algorithms were followed during the patient's care in the ED.    As part of my medical decision making, I reviewed the following data within the [facility] notes reviewed and incorporated, Labs reviewed , Old chart reviewed, Radiograph reviewed , A consult was requested and obtained from this/these consultant(s) Urology, Notes from prior ED visits and [place] Controlled Substance Database  ____________________________________________   FINAL CLINICAL IMPRESSION(S) / ED DIAGNOSES  Final diagnoses:  RUQ pain  Kidney stone      NEW MEDICATIONS STARTED DURING THIS VISIT:  New Prescriptions   HYDROCODONE-ACETAMINOPHEN (NORCO/VICODIN) 5-325 MG TABLET    Take 1 tablet by mouth every 6 (six) hours as needed for moderate pain.   TAMSULOSIN (FLOMAX) 0.4 MG CAPS  CAPSULE    Take 1 capsule (0.4 mg total) by mouth daily.     Note:  This document was prepared using Dragon voice recognition software and may include unintentional dictation errors.    Versie Starks, PA-C 08/05/20 1621    Naaman Plummer, MD 08/12/20 [phone]

## 2020-08-05 NOTE — ED Notes (Signed)
ED Provider at bedside. 

## 2020-08-05 NOTE — ED Notes (Signed)
Discharge intructions reviewed. Pt calm , collective , denied pain or sob. Pt ambulatory.

## 2020-08-05 NOTE — Discharge Instructions (Addendum)
Follow up with urology, she will see you in the [facility] office on Friday at 9:15, call to confirm Take the pain medication as prescribed Drink plenty of water, continue your regular medications

## 2020-08-05 NOTE — ED Triage Notes (Signed)
Pt comes into the ED via POV c/o right flank pain that started a couple days.  Pt denies any h/o kidney stones, N/V/D.  Pt ambulatory to exam room at this time.  Pt denies any new urinary problems.  Pt is diabetic and has HTN.

## 2020-08-07 ENCOUNTER — Encounter: Payer: Self-pay | Admitting: Urology

## 2020-08-07 ENCOUNTER — Other Ambulatory Visit: Payer: Self-pay

## 2020-08-07 ENCOUNTER — Other Ambulatory Visit
Admission: RE | Admit: 2020-08-07 | Discharge: 2020-08-07 | Disposition: A | Discharge status: Home / Self Care | Payer: Medicare Other | Attending: Internal Medicine | Admitting: Internal Medicine

## 2020-08-07 ENCOUNTER — Ambulatory Visit (INDEPENDENT_AMBULATORY_CARE_PROVIDER_SITE_OTHER): Payer: Medicare Other | Admitting: Urology

## 2020-08-07 VITALS — BP 153/79 | HR 73 | Ht 64.5 in | Wt 165.0 lb

## 2020-08-07 DIAGNOSIS — N201 Calculus of ureter: Secondary | ICD-10-CM | POA: Insufficient documentation

## 2020-08-07 NOTE — H&P (View-Only) (Signed)
08/07/2020 9:27 AM   Romeo Rabon 22-Apr-1955 [id number]  Referring provider: [facility] [address] [place],  [place] [address]  Chief Complaint  Patient presents with  . New Patient (Initial Visit)    ER follow-up    HPI: 65 year old female who presents today for further evaluation of a 1.2 cm right mid ureteral calculus.  She presented to the emergency room on 08/05/2020 with severe right flank pain.  She was found to have a large 1.2 cm mid ureteral calculus with severe proximal hydroureteronephrosis.  She has some smaller known obstructing stones bilaterally.  Her urinalysis was fairly unremarkable and labs were stable.  Her pain was able to be controlled and she was referred as an an outpatient to discuss management.  Hounsfield units 1100, stone skin distance 14.4 cm  She has no previous history of kidney stones.  She notes today that her pain started acutely on the day of the ER visit.  She had no pain prior to this.  She continues to have intermittent right flank pain today but is not severe.  She has not had to take her oxycodone.  She not taking any over-the-counter medications or aspirin.  She denies any urinary symptoms including no dysuria, gross hematuria, fevers or chills.   PMH: Past Medical History:  Diagnosis Date  . Arthritis   . Diabetes mellitus without complication ([place])   . Hyperlipidemia   . Hypertension   . Hyperthyroidism   . Thyroid disease     Surgical History: Past Surgical History:  Procedure Laterality Date  . ABDOMINAL HYSTERECTOMY    . AMPUTATION Left 12/15/2017   Procedure: AMPUTATION DIGIT left long finger;  Surgeon: Dereck Leep, MD;  Location: [facility] ORS;  Service: Orthopedics;  Laterality: Left;  . PARTIAL HYSTERECTOMY    . TOTAL HIP ARTHROPLASTY Left 11/13/2019   Procedure: TOTAL HIP ARTHROPLASTY ANTERIOR APPROACH;  Surgeon: Lovell Sheehan, MD;  Location: [facility] ORS;  Service: Orthopedics;   Laterality: Left;    Home Medications:  Allergies as of 08/07/2020      Reactions   Ace Inhibitors Nausea Only   Farxiga [dapagliflozin] Other (See Comments)   Vaginitis      Medication List       Accurate as of Aug 07, 2020  9:27 AM. If you have any questions, ask your nurse or doctor.        amLODipine 5 MG tablet Commonly known as: NORVASC Take 1 tablet (5 mg total) by mouth daily.   aspirin 81 MG chewable tablet Chew 1 tablet (81 mg total) by mouth 2 (two) times daily.   atorvastatin 10 MG tablet Commonly known as: LIPITOR Take 1 tablet (10 mg total) by mouth daily.   docusate sodium 100 MG capsule Commonly known as: COLACE Take 1 capsule (100 mg total) by mouth 2 (two) times daily.   HYDROcodone-acetaminophen 5-325 MG tablet Commonly known as: NORCO/VICODIN Take 1 tablet by mouth every 6 (six) hours as needed for moderate pain.   losartan 50 MG tablet Commonly known as: COZAAR Take 1 tablet (50 mg total) by mouth daily. What changed: how much to take   metFORMIN 500 MG tablet Commonly known as: GLUCOPHAGE Take 1.5 tablets (750 mg total) by mouth 2 (two) times daily with a meal. What changed: how much to take   methimazole 10 MG tablet Commonly known as: TAPAZOLE Take 0.5 tablets (5 mg total) by mouth daily.   methocarbamol 500 MG tablet Commonly known as:  ROBAXIN Take 500 mg by mouth 3 (three) times daily.   metoprolol tartrate 25 MG tablet Commonly known as: LOPRESSOR TAKE ONE TABLET BY MOUTH 2 TIMES A DAY What changed:   how much to take  how to take this  when to take this  additional instructions   ondansetron 4 MG tablet Commonly known as: ZOFRAN Take 1 tablet (4 mg total) by mouth every 6 (six) hours as needed for nausea.   tamsulosin 0.4 MG Caps capsule Commonly known as: FLOMAX Take 1 capsule (0.4 mg total) by mouth daily.   traMADol 50 MG tablet Commonly known as: ULTRAM Take 50 mg by mouth every 6 (six) hours as needed for  moderate pain.       Allergies:  Allergies  Allergen Reactions  . Ace Inhibitors Nausea Only  . Wilder Glade [Dapagliflozin] Other (See Comments)    Vaginitis     Family History: Family History  Problem Relation Age of Onset  . Diabetes Mother   . Diabetes Sister   . Stroke Sister   . Breast cancer Neg Hx     Social History:  reports that she quit smoking about 20 months ago. Her smoking use included cigarettes. She has never used smokeless tobacco. She reports that she does not drink alcohol and does not use drugs.   Physical Exam: BP (!) 153/79   Pulse 73   Ht 5' 4.5" (1.638 m)   Wt 165 lb (74.8 kg)   BMI 27.88 kg/m   Constitutional:  Alert and oriented, No acute distress. HEENT:  AT, moist mucus membranes.  Trachea midline, no masses. Cardiovascular: No clubbing, cyanosis, or edema. Respiratory: Normal respiratory effort, no increased work of breathing. Skin: No rashes, bruises or suspicious lesions. Neurologic: Grossly intact, no focal deficits, moving all 4 extremities. Psychiatric: Normal mood and affect.  Laboratory Data: Lab Results  Component Value Date   WBC 7.8 08/05/2020   HGB 12.2 08/05/2020   HCT 36.8 08/05/2020   MCV 80.2 08/05/2020   PLT 187 08/05/2020    Lab Results  Component Value Date   CREATININE 0.56 08/05/2020    Lab Results  Component Value Date   HGBA1C 7.5 (H) 11/14/2019    Urinalysis Pending today, reviewed from ED visit  Pertinent Imaging: CT Renal Stone Study  Narrative CLINICAL DATA:  Right flank pain for couple days, kidney stones suspected, no history of kidney stone  EXAM: CT ABDOMEN AND PELVIS WITHOUT CONTRAST  TECHNIQUE: Multidetector CT imaging of the abdomen and pelvis was performed following the standard protocol without IV contrast.  COMPARISON:  11/17/2008  FINDINGS: Lower chest: No acute abnormality.  Coronary artery calcifications.  Hepatobiliary: No solid liver abnormality is seen. No  gallstones, gallbladder wall thickening, or biliary dilatation.  Pancreas: Unremarkable. No pancreatic ductal dilatation or surrounding inflammatory changes.  Spleen: Normal in size without significant abnormality.  Adrenals/Urinary Tract: Adrenal glands are unremarkable. There is a 1.2 cm calculus in the middle third of the right ureter with severe associated hydronephrosis and hydroureter. There are additional small bilateral nonobstructive calculi. Bladder is unremarkable.  Stomach/Bowel: Stomach is within normal limits. Appendix appears normal. No evidence of bowel wall thickening, distention, or inflammatory changes. Sigmoid diverticulosis.  Vascular/Lymphatic: Aortic atherosclerosis. No enlarged abdominal or pelvic lymph nodes.  Reproductive: Status post hysterectomy.  Other: No abdominal wall hernia or abnormality. No abdominopelvic ascites.  Musculoskeletal: No acute or significant osseous findings. Status post left hip total arthroplasty.  IMPRESSION: 1. There is a 1.2 cm calculus  in the middle third of the right ureter with severe associated hydronephrosis and hydroureter. There are additional small bilateral nonobstructive calculi. 2. Coronary artery disease.  Aortic Atherosclerosis (ICD10-I70.0).   Electronically Signed By: Eddie Candle M.D. On: 08/05/2020 14:39   Assessment & Plan:    1. Right ureteral stone Large 1.2 cm obstructing right ureteral calculus in the absence of infection  Based on the size and location of the stone, is unlikely to pass continuously, such, I recommended intervention.  We discussed various treatment options for urolithiasis including observation with or without medical expulsive therapy, shockwave lithotripsy (SWL), ureteroscopy and laser lithotripsy with stent placement, and percutaneous nephrolithotomy.   We discussed that management is based on stone size, location, density, patient co-morbidities, and patient preference.     Stones <28mm in size have a >80% spontaneous passage rate. Data surrounding the use of tamsulosin for medical expulsive therapy is controversial, but meta analyses suggests it is most efficacious for distal stones between 5-98mm in size. Possible side effects include dizziness/lightheadedness, and retrograde ejaculation.   SWL has a lower stone free rate in a single procedure, but also a lower complication rate compared to ureteroscopy and avoids a stent and associated stent related symptoms. Possible complications include renal hematoma, steinstrasse, and need for additional treatment. We discussed the role of his increased skin to stone distance can lead to decreased efficacy with shockwave lithotripsy.   Ureteroscopy with laser lithotripsy and stent placement has a higher stone free rate than SWL in a single procedure, however increased complication rate including possible infection, ureteral injury, bleeding, and stent related morbidity. Common stent related symptoms include dysuria, urgency/frequency, and flank pain.   After an extensive discussion of the risks and benefits of the above treatment options, the patient would like to proceed with ESWL  Preop Ua/ Ux  Hollice Espy, MD  [facility] [address], [place], [place] [address] [phone]

## 2020-08-07 NOTE — Progress Notes (Signed)
08/07/2020 9:27 AM   Sarah [name] 22-Apr-1955 [id number]  Referring provider: [facility] [address] [place],  [place] [address]  Chief Complaint  Patient presents with  . New Patient (Initial Visit)    ER follow-up    HPI: 65 year old female who presents today for further evaluation of a 1.2 cm right mid ureteral calculus.  She presented to the emergency room on 08/05/2020 with severe right flank pain.  She was found to have a large 1.2 cm mid ureteral calculus with severe proximal hydroureteronephrosis.  She has some smaller known obstructing stones bilaterally.  Her urinalysis was fairly unremarkable and labs were stable.  Her pain was able to be controlled and she was referred as an an outpatient to discuss management.  Hounsfield units 1100, stone skin distance 14.4 cm  She has no previous history of kidney stones.  She notes today that her pain started acutely on the day of the ER visit.  She had no pain prior to this.  She continues to have intermittent right flank pain today but is not severe.  She has not had to take her oxycodone.  She not taking any over-the-counter medications or aspirin.  She denies any urinary symptoms including no dysuria, gross hematuria, fevers or chills.   PMH: Past Medical History:  Diagnosis Date  . Arthritis   . Diabetes mellitus without complication ([place])   . Hyperlipidemia   . Hypertension   . Hyperthyroidism   . Thyroid disease     Surgical History: Past Surgical History:  Procedure Laterality Date  . ABDOMINAL HYSTERECTOMY    . AMPUTATION Left 12/15/2017   Procedure: AMPUTATION DIGIT left long finger;  Surgeon: Dereck Leep, MD;  Location: [facility] ORS;  Service: Orthopedics;  Laterality: Left;  . PARTIAL HYSTERECTOMY    . TOTAL HIP ARTHROPLASTY Left 11/13/2019   Procedure: TOTAL HIP ARTHROPLASTY ANTERIOR APPROACH;  Surgeon: Lovell Sheehan, MD;  Location: [facility] ORS;  Service: Orthopedics;   Laterality: Left;    Home Medications:  Allergies as of 08/07/2020      Reactions   Ace Inhibitors Nausea Only   Farxiga [dapagliflozin] Other (See Comments)   Vaginitis      Medication List       Accurate as of Aug 07, 2020  9:27 AM. If you have any questions, ask your nurse or doctor.        amLODipine 5 MG tablet Commonly known as: NORVASC Take 1 tablet (5 mg total) by mouth daily.   aspirin 81 MG chewable tablet Chew 1 tablet (81 mg total) by mouth 2 (two) times daily.   atorvastatin 10 MG tablet Commonly known as: LIPITOR Take 1 tablet (10 mg total) by mouth daily.   docusate sodium 100 MG capsule Commonly known as: COLACE Take 1 capsule (100 mg total) by mouth 2 (two) times daily.   HYDROcodone-acetaminophen 5-325 MG tablet Commonly known as: NORCO/VICODIN Take 1 tablet by mouth every 6 (six) hours as needed for moderate pain.   losartan 50 MG tablet Commonly known as: COZAAR Take 1 tablet (50 mg total) by mouth daily. What changed: how much to take   metFORMIN 500 MG tablet Commonly known as: GLUCOPHAGE Take 1.5 tablets (750 mg total) by mouth 2 (two) times daily with a meal. What changed: how much to take   methimazole 10 MG tablet Commonly known as: TAPAZOLE Take 0.5 tablets (5 mg total) by mouth daily.   methocarbamol 500 MG tablet Commonly known as:  ROBAXIN Take 500 mg by mouth 3 (three) times daily.   metoprolol tartrate 25 MG tablet Commonly known as: LOPRESSOR TAKE ONE TABLET BY MOUTH 2 TIMES A DAY What changed:   how much to take  how to take this  when to take this  additional instructions   ondansetron 4 MG tablet Commonly known as: ZOFRAN Take 1 tablet (4 mg total) by mouth every 6 (six) hours as needed for nausea.   tamsulosin 0.4 MG Caps capsule Commonly known as: FLOMAX Take 1 capsule (0.4 mg total) by mouth daily.   traMADol 50 MG tablet Commonly known as: ULTRAM Take 50 mg by mouth every 6 (six) hours as needed for  moderate pain.       Allergies:  Allergies  Allergen Reactions  . Ace Inhibitors Nausea Only  . Farxiga [Dapagliflozin] Other (See Comments)    Vaginitis     Family History: Family History  Problem Relation Age of Onset  . Diabetes Mother   . Diabetes Sister   . Stroke Sister   . Breast cancer Neg Hx     Social History:  reports that she quit smoking about 20 months ago. Her smoking use included cigarettes. She has never used smokeless tobacco. She reports that she does not drink alcohol and does not use drugs.   Physical Exam: BP (!) 153/79   Pulse 73   Ht 5' 4.5" (1.638 m)   Wt 165 lb (74.8 kg)   BMI 27.88 kg/m   Constitutional:  Alert and oriented, No acute distress. HEENT: [place] AT, moist mucus membranes.  Trachea midline, no masses. Cardiovascular: No clubbing, cyanosis, or edema. Respiratory: Normal respiratory effort, no increased work of breathing. Skin: No rashes, bruises or suspicious lesions. Neurologic: Grossly intact, no focal deficits, moving all 4 extremities. Psychiatric: Normal mood and affect.  Laboratory Data: Lab Results  Component Value Date   WBC 7.8 08/05/2020   HGB 12.2 08/05/2020   HCT 36.8 08/05/2020   MCV 80.2 08/05/2020   PLT 187 08/05/2020    Lab Results  Component Value Date   CREATININE 0.56 08/05/2020    Lab Results  Component Value Date   HGBA1C 7.5 (H) 11/14/2019    Urinalysis Pending today, reviewed from ED visit  Pertinent Imaging: CT Renal Stone Study  Narrative CLINICAL DATA:  Right flank pain for couple days, kidney stones suspected, no history of kidney stone  EXAM: CT ABDOMEN AND PELVIS WITHOUT CONTRAST  TECHNIQUE: Multidetector CT imaging of the abdomen and pelvis was performed following the standard protocol without IV contrast.  COMPARISON:  11/17/2008  FINDINGS: Lower chest: No acute abnormality.  Coronary artery calcifications.  Hepatobiliary: No solid liver abnormality is seen. No  gallstones, gallbladder wall thickening, or biliary dilatation.  Pancreas: Unremarkable. No pancreatic ductal dilatation or surrounding inflammatory changes.  Spleen: Normal in size without significant abnormality.  Adrenals/Urinary Tract: Adrenal glands are unremarkable. There is a 1.2 cm calculus in the middle third of the right ureter with severe associated hydronephrosis and hydroureter. There are additional small bilateral nonobstructive calculi. Bladder is unremarkable.  Stomach/Bowel: Stomach is within normal limits. Appendix appears normal. No evidence of bowel wall thickening, distention, or inflammatory changes. Sigmoid diverticulosis.  Vascular/Lymphatic: Aortic atherosclerosis. No enlarged abdominal or pelvic lymph nodes.  Reproductive: Status post hysterectomy.  Other: No abdominal wall hernia or abnormality. No abdominopelvic ascites.  Musculoskeletal: No acute or significant osseous findings. Status post left hip total arthroplasty.  IMPRESSION: 1. There is a 1.2 cm calculus   in the middle third of the right ureter with severe associated hydronephrosis and hydroureter. There are additional small bilateral nonobstructive calculi. 2. Coronary artery disease.  Aortic Atherosclerosis (ICD10-I70.0).   Electronically Signed By: Alex  Bibbey M.D. On: 08/05/2020 14:39   Assessment & Plan:    1. Right ureteral stone Large 1.2 cm obstructing right ureteral calculus in the absence of infection  Based on the size and location of the stone, is unlikely to pass continuously, such, I recommended intervention.  We discussed various treatment options for urolithiasis including observation with or without medical expulsive therapy, shockwave lithotripsy (SWL), ureteroscopy and laser lithotripsy with stent placement, and percutaneous nephrolithotomy.   We discussed that management is based on stone size, location, density, patient co-morbidities, and patient preference.     Stones <5mm in size have a >80% spontaneous passage rate. Data surrounding the use of tamsulosin for medical expulsive therapy is controversial, but meta analyses suggests it is most efficacious for distal stones between 5-10mm in size. Possible side effects include dizziness/lightheadedness, and retrograde ejaculation.   SWL has a lower stone free rate in a single procedure, but also a lower complication rate compared to ureteroscopy and avoids a stent and associated stent related symptoms. Possible complications include renal hematoma, steinstrasse, and need for additional treatment. We discussed the role of his increased skin to stone distance can lead to decreased efficacy with shockwave lithotripsy.   Ureteroscopy with laser lithotripsy and stent placement has a higher stone free rate than SWL in a single procedure, however increased complication rate including possible infection, ureteral injury, bleeding, and stent related morbidity. Common stent related symptoms include dysuria, urgency/frequency, and flank pain.   After an extensive discussion of the risks and benefits of the above treatment options, the patient would like to proceed with ESWL  Preop Ua/ Ux  Eaton Folmar, MD  [facility] [address], [address] [place], [place] [address] [phone]  

## 2020-08-08 LAB — URINE CULTURE: Culture: <10000 — AB

## 2020-08-13 ENCOUNTER — Other Ambulatory Visit: Payer: Self-pay

## 2020-08-13 ENCOUNTER — Encounter
Admission: RE | Disposition: A | Discharge status: Home / Self Care | Payer: Self-pay | Source: Home / Self Care | Attending: Urology

## 2020-08-13 ENCOUNTER — Ambulatory Visit
Admission: RE | Admit: 2020-08-13 | Discharge: 2020-08-13 | Disposition: A | Discharge status: Home / Self Care | Payer: Medicare Other | Attending: Urology | Admitting: Urology

## 2020-08-13 ENCOUNTER — Ambulatory Visit: Payer: Medicare Other

## 2020-08-13 ENCOUNTER — Encounter: Payer: Self-pay | Admitting: Urology

## 2020-08-13 DIAGNOSIS — I10 Essential (primary) hypertension: Secondary | ICD-10-CM | POA: Diagnosis not present

## 2020-08-13 DIAGNOSIS — Z7982 Long term (current) use of aspirin: Secondary | ICD-10-CM | POA: Diagnosis not present

## 2020-08-13 DIAGNOSIS — N201 Calculus of ureter: Secondary | ICD-10-CM

## 2020-08-13 DIAGNOSIS — N200 Calculus of kidney: Secondary | ICD-10-CM | POA: Diagnosis not present

## 2020-08-13 DIAGNOSIS — Z7984 Long term (current) use of oral hypoglycemic drugs: Secondary | ICD-10-CM | POA: Insufficient documentation

## 2020-08-13 DIAGNOSIS — Z01818 Encounter for other preprocedural examination: Secondary | ICD-10-CM | POA: Diagnosis not present

## 2020-08-13 DIAGNOSIS — Z89022 Acquired absence of left finger(s): Secondary | ICD-10-CM | POA: Diagnosis not present

## 2020-08-13 DIAGNOSIS — Z79899 Other long term (current) drug therapy: Secondary | ICD-10-CM | POA: Insufficient documentation

## 2020-08-13 DIAGNOSIS — Z96642 Presence of left artificial hip joint: Secondary | ICD-10-CM | POA: Insufficient documentation

## 2020-08-13 DIAGNOSIS — E119 Type 2 diabetes mellitus without complications: Secondary | ICD-10-CM | POA: Diagnosis not present

## 2020-08-13 DIAGNOSIS — N132 Hydronephrosis with renal and ureteral calculous obstruction: Secondary | ICD-10-CM | POA: Diagnosis not present

## 2020-08-13 DIAGNOSIS — M1611 Unilateral primary osteoarthritis, right hip: Secondary | ICD-10-CM | POA: Diagnosis not present

## 2020-08-13 DIAGNOSIS — Z87891 Personal history of nicotine dependence: Secondary | ICD-10-CM | POA: Insufficient documentation

## 2020-08-13 HISTORY — PX: EXTRACORPOREAL SHOCK WAVE LITHOTRIPSY: SHX1557

## 2020-08-13 LAB — GLUCOSE, CAPILLARY: Glucose-Capillary: 174 mg/dL — ABNORMAL HIGH (ref 70–99)

## 2020-08-13 SURGERY — LITHOTRIPSY, ESWL
Anesthesia: Moderate Sedation | Laterality: Right

## 2020-08-13 MED ORDER — CEPHALEXIN 500 MG PO CAPS
ORAL_CAPSULE | ORAL | Status: AC
  Administered 2020-08-13: 500 mg via ORAL
  Filled 2020-08-13: qty 1

## 2020-08-13 MED ORDER — DIAZEPAM 5 MG PO TABS: 10.0000 mg | ORAL_TABLET | ORAL | Status: AC

## 2020-08-13 MED ORDER — SODIUM CHLORIDE 0.9 % IV SOLN: INTRAVENOUS | Status: DC

## 2020-08-13 MED ORDER — DIAZEPAM 5 MG PO TABS
ORAL_TABLET | ORAL | Status: AC
  Administered 2020-08-13: 10 mg via ORAL
  Filled 2020-08-13: qty 2

## 2020-08-13 MED ORDER — DIPHENHYDRAMINE HCL 25 MG PO CAPS
ORAL_CAPSULE | ORAL | Status: AC
  Administered 2020-08-13: 25 mg via ORAL
  Filled 2020-08-13: qty 1

## 2020-08-13 MED ORDER — ONDANSETRON HCL 4 MG/2ML IJ SOLN
INTRAMUSCULAR | Status: AC
  Administered 2020-08-13: 4 mg via INTRAVENOUS
  Filled 2020-08-13: qty 2

## 2020-08-13 MED ORDER — ONDANSETRON HCL 4 MG/2ML IJ SOLN: 4.0000 mg | Freq: Once | INTRAMUSCULAR | Status: AC

## 2020-08-13 MED ORDER — CEPHALEXIN 500 MG PO CAPS
500.0000 mg | ORAL_CAPSULE | Freq: Once | ORAL | Status: AC
Start: 2020-08-13 — End: 2020-08-13

## 2020-08-13 MED ORDER — HYDROCODONE-ACETAMINOPHEN 5-325 MG PO TABS: 1.0000 | ORAL_TABLET | Freq: Four times a day (QID) | ORAL | 0 refills | Status: DC | PRN

## 2020-08-13 MED ORDER — CHLORHEXIDINE GLUCONATE 0.12 % MT SOLN
OROMUCOSAL | Status: AC
  Filled 2020-08-13: qty 15

## 2020-08-13 MED ORDER — DIPHENHYDRAMINE HCL 25 MG PO CAPS: 25.0000 mg | ORAL_CAPSULE | ORAL | Status: AC

## 2020-08-13 NOTE — Progress Notes (Signed)
Order placed for KUB.

## 2020-08-13 NOTE — Interval H&P Note (Signed)
History and Physical Interval Note:  08/13/2020 9:21 AM  Sarah [name]  has presented today for surgery, with the diagnosis of Kidney stone.  The various methods of treatment have been discussed with the patient and family. After consideration of risks, benefits and other options for treatment, the patient has consented to  Procedure(s): EXTRACORPOREAL SHOCK WAVE LITHOTRIPSY (ESWL) (Right) as a surgical intervention.  The patient's history has been reviewed, patient examined, no change in status, stable for surgery.  I have reviewed the patient's chart and labs.  Questions were answered to the patient's satisfaction.    RRR CTAB  Hollice Espy

## 2020-08-13 NOTE — Discharge Instructions (Signed)
AMBULATORY SURGERY  DISCHARGE INSTRUCTIONS   1) The drugs that you were given will stay in your system until tomorrow so for the next 24 hours you should not:  A) Drive an automobile B) Make any legal decisions C) Drink any alcoholic beverage   2) You may resume regular meals tomorrow.  Today it is better to start with liquids and gradually work up to solid foods.  You may eat anything you prefer, but it is better to start with liquids, then soup and crackers, and gradually work up to solid foods.   3) Please notify your doctor immediately if you have any unusual bleeding, trouble breathing, redness and pain at the surgery site, drainage, fever, or pain not relieved by medication.    4) Additional Instructions:   Please contact your physician with any problems or Same Day Surgery at [phone], Monday through Friday 6 am to 4 pm, or [facility] number at [phone].See [facility] discharge instructions in chart.  

## 2020-08-20 DIAGNOSIS — E0590 Thyrotoxicosis, unspecified without thyrotoxic crisis or storm: Secondary | ICD-10-CM | POA: Diagnosis not present

## 2020-08-20 DIAGNOSIS — E119 Type 2 diabetes mellitus without complications: Secondary | ICD-10-CM | POA: Diagnosis not present

## 2020-08-20 DIAGNOSIS — I10 Essential (primary) hypertension: Secondary | ICD-10-CM | POA: Diagnosis not present

## 2020-08-26 ENCOUNTER — Ambulatory Visit
Admission: RE | Admit: 2020-08-26 | Discharge: 2020-08-26 | Disposition: A | Discharge status: Home / Self Care | Payer: Medicare Other | Source: Ambulatory Visit | Attending: Physician Assistant | Admitting: Physician Assistant

## 2020-08-26 ENCOUNTER — Ambulatory Visit (INDEPENDENT_AMBULATORY_CARE_PROVIDER_SITE_OTHER): Payer: Medicare Other | Admitting: Physician Assistant

## 2020-08-26 ENCOUNTER — Other Ambulatory Visit: Payer: Self-pay

## 2020-08-26 VITALS — BP 151/83 | HR 91 | Ht 64.0 in | Wt 160.0 lb

## 2020-08-26 DIAGNOSIS — Z96642 Presence of left artificial hip joint: Secondary | ICD-10-CM | POA: Diagnosis not present

## 2020-08-26 DIAGNOSIS — Z87442 Personal history of urinary calculi: Secondary | ICD-10-CM | POA: Diagnosis not present

## 2020-08-26 DIAGNOSIS — N201 Calculus of ureter: Secondary | ICD-10-CM | POA: Diagnosis not present

## 2020-08-26 MED ORDER — TAMSULOSIN HCL 0.4 MG PO CAPS: 0.4000 mg | ORAL_CAPSULE | Freq: Every day | ORAL | 0 refills | Status: DC

## 2020-08-26 NOTE — Progress Notes (Signed)
08/26/2020 10:51 AM   Sarah [name] [birth year] [id number]  CC: Chief Complaint  Patient presents with  . Right ureteral stone    HPI: Sarah [name] is a 65 y.o. female with PMH diabetes who underwent ESWL with Dr. Erlene Quan 13 days ago for management of a 1.2 cm mid right ureteral calculus who presents today for postop follow up.  Operative note significant for smudging of the stone.  Today she reports resolution of her flank pain and denies nausea and vomiting.  She has passed multiple stone fragments and brings them with her today for analysis.  On personal review, her fragments measure a total of approximately 3 mm in diameter.  KUB today reveals a persistent fragment at the mid right ureter just inferior to the transverse process of L3, however he does appear that the bulk of the stone has been passed.  In-office UA today positive for 3+ glucose and trace intact blood; urine microscopy pan negative.  PMH: Past Medical History:  Diagnosis Date  . Arthritis   . Diabetes mellitus without complication ([place])   . Hyperlipidemia   . Hypertension   . Hyperthyroidism   . Thyroid disease     Surgical History: Past Surgical History:  Procedure Laterality Date  . ABDOMINAL HYSTERECTOMY    . AMPUTATION Left 12/15/2017   Procedure: AMPUTATION DIGIT left long finger;  Surgeon: Dereck Leep, MD;  Location: [facility];  Service: Orthopedics;  Laterality: Left;  . EXTRACORPOREAL SHOCK WAVE LITHOTRIPSY Right 08/13/2020   Procedure: EXTRACORPOREAL SHOCK WAVE LITHOTRIPSY (ESWL);  Surgeon: Hollice Espy, MD;  Location: [facility];  Service: Urology;  Laterality: Right;  . PARTIAL HYSTERECTOMY    . TOTAL HIP ARTHROPLASTY Left 11/13/2019   Procedure: TOTAL HIP ARTHROPLASTY ANTERIOR APPROACH;  Surgeon: Lovell Sheehan, MD;  Location: [facility];  Service: Orthopedics;  Laterality: Left;    Home Medications:  Allergies as of 08/26/2020      Reactions   Ace Inhibitors Nausea Only    Farxiga [dapagliflozin] Other (See Comments)   Vaginitis      Medication List       Accurate as of August 26, 2020 10:51 AM. If you have any questions, ask your nurse or doctor.        STOP taking these medications   aspirin 81 MG chewable tablet Stopped by: Debroah Loop, PA-C   docusate sodium 100 MG capsule Commonly known as: COLACE Stopped by: Debroah Loop, PA-C   HYDROcodone-acetaminophen 5-325 MG tablet Commonly known as: NORCO/VICODIN Stopped by: Debroah Loop, PA-C   methocarbamol 500 MG tablet Commonly known as: ROBAXIN Stopped by: Debroah Loop, PA-C   metoprolol tartrate 25 MG tablet Commonly known as: LOPRESSOR Stopped by: Debroah Loop, PA-C   ondansetron 4 MG tablet Commonly known as: ZOFRAN Stopped by: Debroah Loop, PA-C   tamsulosin 0.4 MG Caps capsule Commonly known as: FLOMAX Stopped by: Debroah Loop, PA-C   traMADol 50 MG tablet Commonly known as: ULTRAM Stopped by: Debroah Loop, PA-C     TAKE these medications   amLODipine 5 MG tablet Commonly known as: NORVASC Take 1 tablet (5 mg total) by mouth daily.   atorvastatin 10 MG tablet Commonly known as: LIPITOR Take 1 tablet (10 mg total) by mouth daily.   empagliflozin 10 MG Tabs tablet Commonly known as: JARDIANCE Take 10 mg by mouth daily.   ibuprofen 200 MG tablet Commonly known as: ADVIL Take 400 mg by mouth every 6 (six) hours as needed.   losartan  50 MG tablet Commonly known as: COZAAR Take 1 tablet (50 mg total) by mouth daily. What changed: how much to take   losartan 25 MG tablet Commonly known as: COZAAR Take 25 mg by mouth daily. What changed: Another medication with the same name was changed. Make sure you understand how and when to take each.   metFORMIN 500 MG tablet Commonly known as: GLUCOPHAGE Take 1.5 tablets (750 mg total) by mouth 2 (two) times daily with a meal. What changed: how much to  take   metFORMIN 1000 MG tablet Commonly known as: GLUCOPHAGE Take 1 tablet by mouth 2 (two) times daily. What changed: Another medication with the same name was changed. Make sure you understand how and when to take each.   methimazole 5 MG tablet Commonly known as: TAPAZOLE Take 5 mg by mouth daily. What changed: Another medication with the same name was removed. Continue taking this medication, and follow the directions you see here. Changed by: Debroah Loop, PA-C       Allergies:  Allergies  Allergen Reactions  . Ace Inhibitors Nausea Only  . Wilder Glade [Dapagliflozin] Other (See Comments)    Vaginitis     Family History: Family History  Problem Relation Age of Onset  . Diabetes Mother   . Diabetes Sister   . Stroke Sister   . Breast cancer Neg Hx     Social History:   reports that she quit smoking about 21 months ago. Her smoking use included cigarettes. She has never used smokeless tobacco. She reports that she does not drink alcohol and does not use drugs.  Physical Exam: BP (!) 151/83   Pulse 91   Ht 5\' 4"  (1.626 m)   Wt 160 lb (72.6 kg)   BMI 27.46 kg/m   Constitutional:  Alert and oriented, no acute distress, nontoxic appearing HEENT: [place], AT Cardiovascular: No clubbing, cyanosis, or edema Respiratory: Normal respiratory effort, no increased work of breathing Skin: No rashes, bruises or suspicious lesions Neurologic: Grossly intact, no focal deficits, moving all 4 extremities Psychiatric: Normal mood and affect  Laboratory Data: Results for orders placed or performed in visit on 08/26/20  Microscopic Examination   Urine  Result Value Ref Range   WBC, UA 0-5 0 - 5 /hpf   RBC 0-2 0 - 2 /hpf   Epithelial Cells (non renal) 0-10 0 - 10 /hpf   Bacteria, UA None seen None seen/Few  Urinalysis, Complete  Result Value Ref Range   Specific Gravity, UA 1.015 1.005 - 1.030   pH, UA 5.0 5.0 - 7.5   Color, UA Yellow Yellow   Appearance Ur Clear Clear    Leukocytes,UA Negative Negative   Protein,UA Negative Negative/Trace   Glucose, UA 3+ (A) Negative   Ketones, UA Negative Negative   RBC, UA Trace (A) Negative   Bilirubin, UA Negative Negative   Urobilinogen, Ur 0.2 0.2 - 1.0 mg/dL   Nitrite, UA Negative Negative   Microscopic Examination See below:    Pertinent Imaging: KUB, 08/26/2020: CLINICAL DATA:  65 year old female with right ureteral stone.  EXAM: ABDOMEN - 1 VIEW  COMPARISON:  Abdominal radiograph dated 08/13/2020 and CT dated 08/05/2020.  FINDINGS: The previously seen stone in the right ureter is not identified on today's study. Small radiopaque focus over the inferior pole of the right kidney again noted corresponding to the previously seen stone.  There is no bowel dilatation or evidence of obstruction. No free air. Degenerative changes of the spine. Left hip  arthroplasty. No acute osseous pathology.  IMPRESSION: Nonvisualization of the previously seen right ureteral calculus.   Electronically Signed   By: Anner Crete M.D.   On: 08/26/2020 19:21  I personally reviewed the images referenced above and note a persistent fragment within the mid right ureter.  Assessment & Plan:   1. Right ureteral stone Pain resolved and UA benign today, however there is evidence of persistent fragment on KUB.  We will continue Flomax and pushing fluids and plan for recheck in 2 weeks with repeat UA and KUB prior.  Patient is in agreement with this plan.  We discussed return precautions today including uncontrollable pain, uncontrollable nausea/vomiting, fever, or chills.  She expressed understanding. - Urinalysis, Complete - Urinalysis, Complete; Future - DG Abd 1 View; Future - tamsulosin (FLOMAX) 0.4 MG CAPS capsule; Take 1 capsule (0.4 mg total) by mouth daily.  Dispense: 15 capsule; Refill: 0 - Calculi, with Photograph   Return in about 2 weeks (around 09/09/2020) for Stone f/u with UA + KUB  prior.  Debroah Loop, PA-C  [facility] [address] [place], [place] [address] [phone]

## 2020-08-26 NOTE — Patient Instructions (Addendum)
For the next 2 weeks, please do the following: -Take Flomax 0.4mg  daily -Stay well hydrated -Treat any pain with ibuprofen/tylenol or Norco/Vicodin, if you have any of these remaining -Treat any nausea with Zofran, if you have any of these remaining  I will plan to see you back in clinic in 2 weeks with another x-ray prior to see if you have passed the rest of your stone.  Please call our office immediately (we are open 8a-5p Monday-Friday) or go to the Emergency Department if you develop any of the following: -Fever -Chills -Nausea and/or vomiting uncontrollable with Zofran -Pain uncontrollable with Norco/Vicodin

## 2020-08-27 LAB — MICROSCOPIC EXAMINATION: Bacteria, UA: NONE SEEN

## 2020-08-27 LAB — URINALYSIS, COMPLETE
Bilirubin, UA: NEGATIVE
Ketones, UA: NEGATIVE
Leukocytes,UA: NEGATIVE
Nitrite, UA: NEGATIVE
Protein,UA: NEGATIVE
Specific Gravity, UA: 1.015 (ref 1.005–1.030)
Urobilinogen, Ur: 0.2 mg/dL (ref 0.2–1.0)
pH, UA: 5 (ref 5.0–7.5)

## 2020-08-31 LAB — CALCULI, WITH PHOTOGRAPH (CLINICAL LAB)
Calcium Oxalate Dihydrate: 10 %
Calcium Oxalate Monohydrate: 90 %
Weight Calculi: 13 mg

## 2020-09-09 DIAGNOSIS — Z23 Encounter for immunization: Secondary | ICD-10-CM | POA: Diagnosis not present

## 2020-09-09 DIAGNOSIS — E0500 Thyrotoxicosis with diffuse goiter without thyrotoxic crisis or storm: Secondary | ICD-10-CM | POA: Diagnosis not present

## 2020-09-09 DIAGNOSIS — E119 Type 2 diabetes mellitus without complications: Secondary | ICD-10-CM | POA: Diagnosis not present

## 2020-09-09 DIAGNOSIS — Z1389 Encounter for screening for other disorder: Secondary | ICD-10-CM | POA: Diagnosis not present

## 2020-09-09 DIAGNOSIS — E0590 Thyrotoxicosis, unspecified without thyrotoxic crisis or storm: Secondary | ICD-10-CM | POA: Diagnosis not present

## 2020-09-10 ENCOUNTER — Encounter: Payer: Self-pay | Admitting: Physician Assistant

## 2020-09-10 ENCOUNTER — Ambulatory Visit
Admission: RE | Admit: 2020-09-10 | Discharge: 2020-09-10 | Disposition: A | Discharge status: Home / Self Care | Payer: Medicare Other | Attending: Physician Assistant | Admitting: Physician Assistant

## 2020-09-10 ENCOUNTER — Ambulatory Visit (INDEPENDENT_AMBULATORY_CARE_PROVIDER_SITE_OTHER): Payer: Medicare Other | Admitting: Physician Assistant

## 2020-09-10 ENCOUNTER — Ambulatory Visit
Admission: RE | Admit: 2020-09-10 | Discharge: 2020-09-10 | Disposition: A | Discharge status: Home / Self Care | Payer: Medicare Other | Source: Ambulatory Visit | Attending: Physician Assistant | Admitting: Physician Assistant

## 2020-09-10 ENCOUNTER — Other Ambulatory Visit: Payer: Self-pay

## 2020-09-10 VITALS — BP 136/75 | HR 84 | Ht 64.0 in | Wt 162.0 lb

## 2020-09-10 DIAGNOSIS — I878 Other specified disorders of veins: Secondary | ICD-10-CM | POA: Diagnosis not present

## 2020-09-10 DIAGNOSIS — N201 Calculus of ureter: Secondary | ICD-10-CM | POA: Diagnosis not present

## 2020-09-10 DIAGNOSIS — N200 Calculus of kidney: Secondary | ICD-10-CM | POA: Diagnosis not present

## 2020-09-10 MED ORDER — TAMSULOSIN HCL 0.4 MG PO CAPS: 0.4000 mg | ORAL_CAPSULE | Freq: Every day | ORAL | 0 refills | Status: DC

## 2020-09-10 NOTE — Progress Notes (Signed)
09/10/2020 10:01 AM   Sarah [name] 1955-12-28 [id number]  CC: Chief Complaint  Patient presents with   Nephrolithiasis   HPI: Sarah [name] is a 65 y.o. female with PMH diabetes who underwent ESWL with Dr. Erlene Quan on 08/13/2020 for management of a 1.2 cm mid right ureteral stone who presents today for postop follow-up.  I saw her in clinic on POD 13, at which point she had passed multiple fragments, however her KUB revealed a persistent fragment at the mid right ureter.  UA was benign with no microscopic hematuria at that time.  Today she reports not having passed any additional fragments since her last clinic visit.  She denies any flank pain, nausea, or vomiting..  KUB today reveals interval migration of the residual fragment/fragments her distal right ureter, possibly configured in a Steinstrasse.  In-office UA today positive for 3+ glucose and trace intact blood; urine microscopy pan negative.  PMH: Past Medical History:  Diagnosis Date   Arthritis    Diabetes mellitus without complication ([place])    Hyperlipidemia    Hypertension    Hyperthyroidism    Thyroid disease     Surgical History: Past Surgical History:  Procedure Laterality Date   ABDOMINAL HYSTERECTOMY     AMPUTATION Left 12/15/2017   Procedure: AMPUTATION DIGIT left long finger;  Surgeon: Dereck Leep, MD;  Location: [facility];  Service: Orthopedics;  Laterality: Left;   EXTRACORPOREAL SHOCK WAVE LITHOTRIPSY Right 08/13/2020   Procedure: EXTRACORPOREAL SHOCK WAVE LITHOTRIPSY (ESWL);  Surgeon: Hollice Espy, MD;  Location: [facility];  Service: Urology;  Laterality: Right;   PARTIAL HYSTERECTOMY     TOTAL HIP ARTHROPLASTY Left 11/13/2019   Procedure: TOTAL HIP ARTHROPLASTY ANTERIOR APPROACH;  Surgeon: Lovell Sheehan, MD;  Location: [facility];  Service: Orthopedics;  Laterality: Left;    Home Medications:  Allergies as of 09/10/2020       Reactions   Ace Inhibitors Nausea Only   Farxiga  [dapagliflozin] Other (See Comments)   Vaginitis        Medication List        Accurate as of September 10, 2020 10:01 AM. If you have any questions, ask your nurse or doctor.          amLODipine 5 MG tablet Commonly known as: NORVASC Take 1 tablet (5 mg total) by mouth daily.   atorvastatin 10 MG tablet Commonly known as: LIPITOR Take 1 tablet (10 mg total) by mouth daily.   empagliflozin 10 MG Tabs tablet Commonly known as: JARDIANCE Take 10 mg by mouth daily.   ibuprofen 200 MG tablet Commonly known as: ADVIL Take 400 mg by mouth every 6 (six) hours as needed.   losartan 50 MG tablet Commonly known as: COZAAR Take 1 tablet (50 mg total) by mouth daily. What changed: how much to take   losartan 25 MG tablet Commonly known as: COZAAR Take 25 mg by mouth daily. What changed: Another medication with the same name was changed. Make sure you understand how and when to take each.   metFORMIN 500 MG tablet Commonly known as: GLUCOPHAGE Take 1.5 tablets (750 mg total) by mouth 2 (two) times daily with a meal. What changed: how much to take   metFORMIN 1000 MG tablet Commonly known as: GLUCOPHAGE Take 1 tablet by mouth 2 (two) times daily. What changed: Another medication with the same name was changed. Make sure you understand how and when to take each.   methimazole 5 MG tablet Commonly  known as: TAPAZOLE Take 10 mg by mouth daily.   tamsulosin 0.4 MG Caps capsule Commonly known as: FLOMAX Take 1 capsule (0.4 mg total) by mouth daily.        Allergies:  Allergies  Allergen Reactions   Ace Inhibitors Nausea Only   Farxiga [Dapagliflozin] Other (See Comments)    Vaginitis     Family History: Family History  Problem Relation Age of Onset   Diabetes Mother    Diabetes Sister    Stroke Sister    Breast cancer Neg Hx     Social History:   reports that she quit smoking about 22 months ago. Her smoking use included cigarettes. She has never used  smokeless tobacco. She reports that she does not drink alcohol and does not use drugs.  Physical Exam: BP 136/75   Pulse 84   Ht 5\' 4"  (1.626 m)   Wt 162 lb (73.5 kg)   BMI 27.81 kg/m   Constitutional:  Alert and oriented, no acute distress, nontoxic appearing HEENT: [place], AT Cardiovascular: No clubbing, cyanosis, or edema Respiratory: Normal respiratory effort, no increased work of breathing Skin: No rashes, bruises or suspicious lesions Neurologic: Grossly intact, no focal deficits, moving all 4 extremities Psychiatric: Normal mood and affect  Laboratory Data: Results for orders placed or performed in visit on 09/10/20  Microscopic Examination   Urine  Result Value Ref Range   WBC, UA 0-5 0 - 5 /hpf   RBC 0-2 0 - 2 /hpf   Epithelial Cells (non renal) 0-10 0 - 10 /hpf   Bacteria, UA Few None seen/Few  Urinalysis, Complete  Result Value Ref Range   Specific Gravity, UA 1.020 1.005 - 1.030   pH, UA 5.0 5.0 - 7.5   Color, UA Yellow Yellow   Appearance Ur Clear Clear   Leukocytes,UA Negative Negative   Protein,UA Negative Negative/Trace   Glucose, UA 3+ (A) Negative   Ketones, UA Negative Negative   RBC, UA Trace (A) Negative   Bilirubin, UA Negative Negative   Urobilinogen, Ur 0.2 0.2 - 1.0 mg/dL   Nitrite, UA Negative Negative   Microscopic Examination See below:    Assessment & Plan:   1. Right ureteral stone No additional fragments passed in the past 2 weeks, but patient remains asymptomatic and UA is benign today.  There has been significant distal migration of the residual fragment from the mid to the distal right ureter.  Counseled her to continue Flomax and pushing fluids.  We will plan for another 2-week follow-up for UA with KUB prior.  At that point, she will be 6 weeks s/p ESWL and if she has residual fragments, may need to consider repeat treatment.  Patient expressed understanding. - Urinalysis, Complete - tamsulosin (FLOMAX) 0.4 MG CAPS capsule; Take 1  capsule (0.4 mg total) by mouth daily.  Dispense: 15 capsule; Refill: 0 - DG Abd 1 View; Future   Return in about 2 weeks (around 09/24/2020) for Stone f/u with UA + KUB prior.  Debroah Loop, PA-C  [facility] [address] [place], [place] [address] [phone]

## 2020-09-10 NOTE — Patient Instructions (Signed)
For the next 2 weeks, please do the following: -Take Flomax 0.4mg  daily as prescribed today -Stay well hydrated -Treat any pain with ibuprofen/tylenol or Percocet  I will plan to see you back in clinic in 2 weeks with another x-ray prior to see if you have passed any more fragments.  Please call our office immediately (we are open 8a-5p Monday-Friday) or go to the Emergency Department if you develop any of the following: -Fever -Chills -Nausea and/or vomiting -Pain uncontrollable with Percocet

## 2020-09-11 DIAGNOSIS — Z0000 Encounter for general adult medical examination without abnormal findings: Secondary | ICD-10-CM | POA: Diagnosis not present

## 2020-09-11 LAB — URINALYSIS, COMPLETE
Bilirubin, UA: NEGATIVE
Ketones, UA: NEGATIVE
Leukocytes,UA: NEGATIVE
Nitrite, UA: NEGATIVE
Protein,UA: NEGATIVE
Specific Gravity, UA: 1.02 (ref 1.005–1.030)
Urobilinogen, Ur: 0.2 mg/dL (ref 0.2–1.0)
pH, UA: 5 (ref 5.0–7.5)

## 2020-09-11 LAB — MICROSCOPIC EXAMINATION

## 2020-09-24 ENCOUNTER — Ambulatory Visit
Admission: RE | Admit: 2020-09-24 | Discharge: 2020-09-24 | Disposition: A | Discharge status: Home / Self Care | Payer: Medicare Other | Attending: Physician Assistant | Admitting: Physician Assistant

## 2020-09-24 ENCOUNTER — Ambulatory Visit
Admission: RE | Admit: 2020-09-24 | Discharge: 2020-09-24 | Disposition: A | Discharge status: Home / Self Care | Payer: Medicare Other | Source: Ambulatory Visit | Attending: Physician Assistant | Admitting: Physician Assistant

## 2020-09-24 ENCOUNTER — Ambulatory Visit (INDEPENDENT_AMBULATORY_CARE_PROVIDER_SITE_OTHER): Payer: Medicare Other | Admitting: Physician Assistant

## 2020-09-24 ENCOUNTER — Other Ambulatory Visit: Payer: Self-pay

## 2020-09-24 ENCOUNTER — Encounter: Payer: Self-pay | Admitting: Physician Assistant

## 2020-09-24 VITALS — BP 130/78 | HR 85 | Ht 64.0 in | Wt 161.0 lb

## 2020-09-24 DIAGNOSIS — N201 Calculus of ureter: Secondary | ICD-10-CM

## 2020-09-24 DIAGNOSIS — N200 Calculus of kidney: Secondary | ICD-10-CM | POA: Diagnosis not present

## 2020-09-24 LAB — URINALYSIS, COMPLETE
Bilirubin, UA: NEGATIVE
Ketones, UA: NEGATIVE
Leukocytes,UA: NEGATIVE
Nitrite, UA: NEGATIVE
Protein,UA: NEGATIVE
Specific Gravity, UA: 1.02 (ref 1.005–1.030)
Urobilinogen, Ur: 0.2 mg/dL (ref 0.2–1.0)
pH, UA: 5.5 (ref 5.0–7.5)

## 2020-09-24 LAB — MICROSCOPIC EXAMINATION: Bacteria, UA: NONE SEEN

## 2020-09-24 NOTE — Progress Notes (Signed)
09/24/2020 9:46 AM   [name] 1955-09-12 [id number]  CC: Chief Complaint  Patient presents with   Nephrolithiasis    HPI: Sarah [name] is a 65 y.o. female with PMH diabetes who underwent ESWL with Dr. Erlene Quan on 08/13/2020 for management of a 1.2 cm mid right ureteral stone who presents today for follow-up.  I have seen her twice since her procedure, during which time I been monitoring her for progressive migration and clearance of residual fragments.  When I saw her most recently 14 days ago, KUB revealed some residual fragment versus fragments in the distal right ureter configured in a possible Steinstrasse.  Today she reports she has not noticed any additional fragment passage, however she has had no flank pain, gross hematuria, nausea, or vomiting.  KUB today with interval clearance of the distal right ureteral fragments.    Her stone analysis is resulted with 90% calcium oxalate monohydrate, 10% calcium oxalate dihydrate.  In-office UA today positive for 3+ glucose and trace intact blood; urine microscopy pan negative.  PMH: Past Medical History:  Diagnosis Date   Arthritis    Diabetes mellitus without complication ([place])    Hyperlipidemia    Hypertension    Hyperthyroidism    Thyroid disease     Surgical History: Past Surgical History:  Procedure Laterality Date   ABDOMINAL HYSTERECTOMY     AMPUTATION Left 12/15/2017   Procedure: AMPUTATION DIGIT left long finger;  Surgeon: Dereck Leep, MD;  Location: [facility];  Service: Orthopedics;  Laterality: Left;   EXTRACORPOREAL SHOCK WAVE LITHOTRIPSY Right 08/13/2020   Procedure: EXTRACORPOREAL SHOCK WAVE LITHOTRIPSY (ESWL);  Surgeon: Hollice Espy, MD;  Location: [facility];  Service: Urology;  Laterality: Right;   PARTIAL HYSTERECTOMY     TOTAL HIP ARTHROPLASTY Left 11/13/2019   Procedure: TOTAL HIP ARTHROPLASTY ANTERIOR APPROACH;  Surgeon: Lovell Sheehan, MD;  Location: [facility];   Service: Orthopedics;  Laterality: Left;    Home Medications:  Allergies as of 09/24/2020       Reactions   Ace Inhibitors Nausea Only   Farxiga [dapagliflozin] Other (See Comments)   Vaginitis        Medication List        Accurate as of September 24, 2020  9:46 AM. If you have any questions, ask your nurse or doctor.          amLODipine 5 MG tablet Commonly known as: NORVASC Take 1 tablet (5 mg total) by mouth daily.   atorvastatin 10 MG tablet Commonly known as: LIPITOR Take 1 tablet (10 mg total) by mouth daily.   empagliflozin 10 MG Tabs tablet Commonly known as: JARDIANCE Take 10 mg by mouth daily.   ibuprofen 200 MG tablet Commonly known as: ADVIL Take 400 mg by mouth every 6 (six) hours as needed.   losartan 50 MG tablet Commonly known as: COZAAR Take 1 tablet (50 mg total) by mouth daily. What changed: how much to take   losartan 25 MG tablet Commonly known as: COZAAR Take 25 mg by mouth daily. What changed: Another medication with the same name was changed. Make sure you understand how and when to take each.   metFORMIN 500 MG tablet Commonly known as: GLUCOPHAGE Take 1.5 tablets (750 mg total) by mouth 2 (two) times daily with a meal. What changed: how much to take   metFORMIN 1000 MG tablet Commonly known as: GLUCOPHAGE Take 1 tablet by mouth 2 (two) times daily. What changed: Another  medication with the same name was changed. Make sure you understand how and when to take each.   methimazole 5 MG tablet Commonly known as: TAPAZOLE Take 10 mg by mouth daily.   tamsulosin 0.4 MG Caps capsule Commonly known as: FLOMAX Take 1 capsule (0.4 mg total) by mouth daily.        Allergies:  Allergies  Allergen Reactions   Ace Inhibitors Nausea Only   Farxiga [Dapagliflozin] Other (See Comments)    Vaginitis     Family History: Family History  Problem Relation Age of Onset   Diabetes Mother    Diabetes Sister    Stroke Sister    Breast  cancer Neg Hx     Social History:   reports that she quit smoking about 22 months ago. Her smoking use included cigarettes. She has never used smokeless tobacco. She reports that she does not drink alcohol and does not use drugs.  Physical Exam: BP 130/78   Pulse 85   Ht 5\' 4"  (1.626 m)   Wt 161 lb (73 kg)   BMI 27.64 kg/m   Constitutional:  Alert and oriented, no acute distress, nontoxic appearing HEENT: [place], AT Cardiovascular: No clubbing, cyanosis, or edema Respiratory: Normal respiratory effort, no increased work of breathing Skin: No rashes, bruises or suspicious lesions Neurologic: Grossly intact, no focal deficits, moving all 4 extremities Psychiatric: Normal mood and affect  Laboratory Data: Results for orders placed or performed in visit on 09/24/20  Microscopic Examination   Urine  Result Value Ref Range   WBC, UA 0-5 0 - 5 /hpf   RBC 0-2 0 - 2 /hpf   Epithelial Cells (non renal) 0-10 0 - 10 /hpf   Bacteria, UA None seen None seen/Few  Urinalysis, Complete  Result Value Ref Range   Specific Gravity, UA 1.020 1.005 - 1.030   pH, UA 5.5 5.0 - 7.5   Color, UA Yellow Yellow   Appearance Ur Clear Clear   Leukocytes,UA Negative Negative   Protein,UA Negative Negative/Trace   Glucose, UA 3+ (A) Negative   Ketones, UA Negative Negative   RBC, UA Trace (A) Negative   Bilirubin, UA Negative Negative   Urobilinogen, Ur 0.2 0.2 - 1.0 mg/dL   Nitrite, UA Negative Negative   Microscopic Examination See below:    Pertinent Imaging: KUB, 09/24/2020: CLINICAL DATA:  Post ESWL right ureteral stone   EXAM: ABDOMEN - 1 VIEW   COMPARISON:  09/10/2020   FINDINGS: Cluster of calculi right lower pole largest approximately 7 mm unchanged. Possible fragments of prior calculus in the mid right ureter at the L3 level unchanged from the recent study. Small left renal calculus better seen on prior studies.   Normal bowel gas pattern.  Left hip replacement.    IMPRESSION: Right lower pole renal calculi unchanged.   Possible fragments of stone remaining in the right ureter to the right of L3.     Electronically Signed   By: Franchot Gallo M.D.   On: 09/25/2020 09:35  I personally reviewed the images referenced above and note interval clearance of the distal right ureteral fragments.  Assessment & Plan:   1. Right ureteral stone Patient has not noticed any additional fragments passed, however KUB reveals interval clearance of the distal right ureteral fragments, UA is clear, and she remains asymptomatic.  I explained at this point that she may have some residual fragments too small to up urine KUB, however I would expect she would be able to pass  these without difficulty and she no longer requires monitoring.  We discussed stone prevention guidelines today including increasing oral hydration, increasing dietary citrate, decreasing dietary oxalate, maintaining moderate dietary calcium, and avoiding dietary sodium.  Printed resources provided to the patient today.  Patient wishes to follow-up with [place] on an annual basis for stone monitoring, which is reasonable.  I counseled her to return to clinic sooner with new flank pain, gross hematuria, or dysuria.  She expressed understanding. - Urinalysis, Complete  Return in about 1 year (around 09/24/2021) for Stone f/u with KUB prior.  Debroah Loop, PA-C  [facility] [address] [place], [place] [address] [phone]

## 2020-09-30 DIAGNOSIS — E119 Type 2 diabetes mellitus without complications: Secondary | ICD-10-CM | POA: Diagnosis not present

## 2020-09-30 DIAGNOSIS — I10 Essential (primary) hypertension: Secondary | ICD-10-CM | POA: Diagnosis not present

## 2020-10-01 ENCOUNTER — Telehealth: Payer: Self-pay

## 2020-10-01 DIAGNOSIS — N201 Calculus of ureter: Secondary | ICD-10-CM

## 2020-10-01 NOTE — Telephone Encounter (Signed)
Let's proceed with renal ultrasound. Order placed. Imaging department to contact patient to schedule.

## 2020-10-01 NOTE — Addendum Note (Signed)
Addended by: Mickle Plumb on: 10/01/2020 05:04 PM   Modules accepted: Orders

## 2020-10-01 NOTE — Telephone Encounter (Signed)
-----   Message from Debroah Loop, [place] sent at 10/01/2020 11:22 AM EDT ----- Please contact the patient and let her know that radiology thinks her seeing some residual fragment from her stone in her right ureter.  On review of her recent CT scan and in discussion with Dr. Erlene Quan, we think it is possible this is residual stone, however it could also be a calcification in an adjacent blood vessel, which does not require treatment.  I recommend either a repeat CT stone study or a renal ultrasound for further evaluation of this.  The CT stone study is the definitive test for kidney stones, however it tends to be a more expensive test and involves more radiation.  The renal ultrasound would only be able to evaluate any persistent swelling in her right kidney, would not be able to definitively state if the entirety of the stone is gone.  That said, it would be less expensive and would not involve the use of radiation.  I am okay to proceed with either imaging test per patient preference.  If there is evidence of persistent stone fragment, she would be a good candidate for repeat shockwave given how much of the stone was cleared with her first procedure. ----- Message ----- From: Interface, Rad Results In Sent: 09/25/2020   9:37 AM EDT To: Debroah Loop, PA-C

## 2020-10-01 NOTE — Telephone Encounter (Signed)
Patient would like to proceed with whichever her insurance thinks is appropriate.

## 2020-10-02 NOTE — Telephone Encounter (Signed)
Patient notified of RUS orders placed.

## 2020-10-22 ENCOUNTER — Ambulatory Visit
Admission: RE | Admit: 2020-10-22 | Discharge: 2020-10-22 | Disposition: A | Discharge status: Home / Self Care | Payer: Medicare Other | Source: Ambulatory Visit | Attending: Physician Assistant | Admitting: Physician Assistant

## 2020-10-22 ENCOUNTER — Other Ambulatory Visit: Payer: Self-pay

## 2020-10-22 DIAGNOSIS — N201 Calculus of ureter: Secondary | ICD-10-CM | POA: Diagnosis not present

## 2020-10-22 DIAGNOSIS — Z87442 Personal history of urinary calculi: Secondary | ICD-10-CM | POA: Diagnosis not present

## 2020-10-22 DIAGNOSIS — N1330 Unspecified hydronephrosis: Secondary | ICD-10-CM | POA: Diagnosis not present

## 2020-10-28 ENCOUNTER — Telehealth: Payer: Self-pay

## 2020-10-28 NOTE — Telephone Encounter (Signed)
Notified patient as advised, patient verbalized understanding.  

## 2020-10-28 NOTE — Telephone Encounter (Signed)
-----   Message from Debroah Loop, [place] sent at 10/27/2020  6:22 PM EDT ----- Right hydronephrosis has resolved consistent with stone clearance, no further intervention indicated. ----- Message ----- From: Interface, Rad Results In Sent: 10/25/2020   3:43 PM EDT To: Debroah Loop, PA-C

## 2020-11-06 DIAGNOSIS — E079 Disorder of thyroid, unspecified: Secondary | ICD-10-CM | POA: Diagnosis not present

## 2020-11-06 DIAGNOSIS — Z79899 Other long term (current) drug therapy: Secondary | ICD-10-CM | POA: Diagnosis not present

## 2020-11-06 DIAGNOSIS — R109 Unspecified abdominal pain: Secondary | ICD-10-CM | POA: Diagnosis not present

## 2020-11-06 DIAGNOSIS — I10 Essential (primary) hypertension: Secondary | ICD-10-CM | POA: Diagnosis not present

## 2020-11-06 DIAGNOSIS — Z7984 Long term (current) use of oral hypoglycemic drugs: Secondary | ICD-10-CM | POA: Diagnosis not present

## 2020-11-06 DIAGNOSIS — E119 Type 2 diabetes mellitus without complications: Secondary | ICD-10-CM | POA: Diagnosis not present

## 2020-11-06 DIAGNOSIS — R222 Localized swelling, mass and lump, trunk: Secondary | ICD-10-CM | POA: Diagnosis not present

## 2020-11-27 ENCOUNTER — Other Ambulatory Visit: Payer: Self-pay | Admitting: Internal Medicine

## 2020-12-01 DIAGNOSIS — E119 Type 2 diabetes mellitus without complications: Secondary | ICD-10-CM | POA: Diagnosis not present

## 2020-12-01 DIAGNOSIS — E0500 Thyrotoxicosis with diffuse goiter without thyrotoxic crisis or storm: Secondary | ICD-10-CM | POA: Diagnosis not present

## 2020-12-01 DIAGNOSIS — Z0000 Encounter for general adult medical examination without abnormal findings: Secondary | ICD-10-CM | POA: Diagnosis not present

## 2020-12-01 DIAGNOSIS — I10 Essential (primary) hypertension: Secondary | ICD-10-CM | POA: Diagnosis not present

## 2020-12-16 DIAGNOSIS — E0500 Thyrotoxicosis with diffuse goiter without thyrotoxic crisis or storm: Secondary | ICD-10-CM | POA: Diagnosis not present

## 2021-01-18 DIAGNOSIS — H40003 Preglaucoma, unspecified, bilateral: Secondary | ICD-10-CM | POA: Diagnosis not present

## 2021-01-18 DIAGNOSIS — H2513 Age-related nuclear cataract, bilateral: Secondary | ICD-10-CM | POA: Diagnosis not present

## 2021-01-18 DIAGNOSIS — Z0130 Encounter for examination of blood pressure without abnormal findings: Secondary | ICD-10-CM | POA: Diagnosis not present

## 2021-01-18 DIAGNOSIS — M25512 Pain in left shoulder: Secondary | ICD-10-CM | POA: Diagnosis not present

## 2021-01-18 DIAGNOSIS — Z0000 Encounter for general adult medical examination without abnormal findings: Secondary | ICD-10-CM | POA: Diagnosis not present

## 2021-01-18 DIAGNOSIS — E0500 Thyrotoxicosis with diffuse goiter without thyrotoxic crisis or storm: Secondary | ICD-10-CM | POA: Diagnosis not present

## 2021-01-20 DIAGNOSIS — Z1211 Encounter for screening for malignant neoplasm of colon: Secondary | ICD-10-CM | POA: Diagnosis not present

## 2021-02-16 DIAGNOSIS — E0500 Thyrotoxicosis with diffuse goiter without thyrotoxic crisis or storm: Secondary | ICD-10-CM | POA: Diagnosis not present

## 2021-02-23 ENCOUNTER — Other Ambulatory Visit: Payer: Self-pay | Admitting: Physician Assistant

## 2021-02-23 DIAGNOSIS — E0500 Thyrotoxicosis with diffuse goiter without thyrotoxic crisis or storm: Secondary | ICD-10-CM | POA: Diagnosis not present

## 2021-02-23 DIAGNOSIS — E042 Nontoxic multinodular goiter: Secondary | ICD-10-CM | POA: Diagnosis not present

## 2021-03-02 DIAGNOSIS — E042 Nontoxic multinodular goiter: Secondary | ICD-10-CM | POA: Diagnosis not present

## 2021-03-03 DIAGNOSIS — E042 Nontoxic multinodular goiter: Secondary | ICD-10-CM | POA: Diagnosis not present

## 2021-03-04 ENCOUNTER — Other Ambulatory Visit: Payer: Self-pay | Admitting: Physician Assistant

## 2021-03-04 DIAGNOSIS — Z1231 Encounter for screening mammogram for malignant neoplasm of breast: Secondary | ICD-10-CM

## 2021-03-24 ENCOUNTER — Ambulatory Visit
Admission: RE | Admit: 2021-03-24 | Discharge: 2021-03-24 | Disposition: A | Discharge status: Home / Self Care | Payer: Medicare Other | Source: Ambulatory Visit | Attending: Physician Assistant | Admitting: Physician Assistant

## 2021-03-24 ENCOUNTER — Other Ambulatory Visit: Payer: Self-pay

## 2021-03-24 DIAGNOSIS — Z1231 Encounter for screening mammogram for malignant neoplasm of breast: Secondary | ICD-10-CM | POA: Insufficient documentation

## 2021-04-01 ENCOUNTER — Other Ambulatory Visit: Payer: Self-pay | Admitting: Physician Assistant

## 2021-04-01 DIAGNOSIS — R928 Other abnormal and inconclusive findings on diagnostic imaging of breast: Secondary | ICD-10-CM

## 2021-04-01 DIAGNOSIS — R921 Mammographic calcification found on diagnostic imaging of breast: Secondary | ICD-10-CM

## 2021-04-06 ENCOUNTER — Other Ambulatory Visit: Payer: Self-pay

## 2021-04-06 ENCOUNTER — Ambulatory Visit
Admission: RE | Admit: 2021-04-06 | Discharge: 2021-04-06 | Disposition: A | Discharge status: Home / Self Care | Payer: Medicare Other | Source: Ambulatory Visit | Attending: Physician Assistant | Admitting: Physician Assistant

## 2021-04-06 DIAGNOSIS — R928 Other abnormal and inconclusive findings on diagnostic imaging of breast: Secondary | ICD-10-CM | POA: Insufficient documentation

## 2021-04-06 DIAGNOSIS — R921 Mammographic calcification found on diagnostic imaging of breast: Secondary | ICD-10-CM | POA: Diagnosis not present

## 2021-04-06 DIAGNOSIS — R922 Inconclusive mammogram: Secondary | ICD-10-CM | POA: Diagnosis not present

## 2021-04-12 ENCOUNTER — Other Ambulatory Visit: Payer: Self-pay | Admitting: Physician Assistant

## 2021-04-12 DIAGNOSIS — R928 Other abnormal and inconclusive findings on diagnostic imaging of breast: Secondary | ICD-10-CM

## 2021-04-12 DIAGNOSIS — R921 Mammographic calcification found on diagnostic imaging of breast: Secondary | ICD-10-CM

## 2021-04-13 ENCOUNTER — Ambulatory Visit
Admission: RE | Admit: 2021-04-13 | Discharge: 2021-04-13 | Disposition: A | Discharge status: Home / Self Care | Payer: Medicare Other | Source: Ambulatory Visit | Attending: Physician Assistant | Admitting: Physician Assistant

## 2021-04-13 ENCOUNTER — Other Ambulatory Visit: Payer: Self-pay

## 2021-04-13 DIAGNOSIS — Z1231 Encounter for screening mammogram for malignant neoplasm of breast: Secondary | ICD-10-CM | POA: Diagnosis not present

## 2021-04-13 DIAGNOSIS — R928 Other abnormal and inconclusive findings on diagnostic imaging of breast: Secondary | ICD-10-CM

## 2021-04-13 DIAGNOSIS — R921 Mammographic calcification found on diagnostic imaging of breast: Secondary | ICD-10-CM | POA: Diagnosis not present

## 2021-04-13 DIAGNOSIS — D242 Benign neoplasm of left breast: Secondary | ICD-10-CM | POA: Insufficient documentation

## 2021-04-13 HISTORY — PX: MM BREAST STEREO BIOPSY LEFT (ARMC HX): HXRAD1824

## 2021-04-13 HISTORY — PX: BREAST BIOPSY: SHX20

## 2021-04-14 LAB — SURGICAL PATHOLOGY

## 2021-07-06 DIAGNOSIS — E0590 Thyrotoxicosis, unspecified without thyrotoxic crisis or storm: Secondary | ICD-10-CM | POA: Diagnosis not present

## 2021-07-06 DIAGNOSIS — Z23 Encounter for immunization: Secondary | ICD-10-CM | POA: Diagnosis not present

## 2021-07-06 DIAGNOSIS — Z0000 Encounter for general adult medical examination without abnormal findings: Secondary | ICD-10-CM | POA: Diagnosis not present

## 2021-07-06 DIAGNOSIS — E119 Type 2 diabetes mellitus without complications: Secondary | ICD-10-CM | POA: Diagnosis not present

## 2021-07-06 DIAGNOSIS — Z0130 Encounter for examination of blood pressure without abnormal findings: Secondary | ICD-10-CM | POA: Diagnosis not present

## 2021-07-19 DIAGNOSIS — H40003 Preglaucoma, unspecified, bilateral: Secondary | ICD-10-CM | POA: Diagnosis not present

## 2021-07-19 DIAGNOSIS — E119 Type 2 diabetes mellitus without complications: Secondary | ICD-10-CM | POA: Diagnosis not present

## 2021-07-19 DIAGNOSIS — H2513 Age-related nuclear cataract, bilateral: Secondary | ICD-10-CM | POA: Diagnosis not present

## 2021-08-09 DIAGNOSIS — I10 Essential (primary) hypertension: Secondary | ICD-10-CM | POA: Diagnosis not present

## 2021-08-09 DIAGNOSIS — Z1389 Encounter for screening for other disorder: Secondary | ICD-10-CM | POA: Diagnosis not present

## 2021-08-09 DIAGNOSIS — M25551 Pain in right hip: Secondary | ICD-10-CM | POA: Diagnosis not present

## 2021-08-09 DIAGNOSIS — Z712 Person consulting for explanation of examination or test findings: Secondary | ICD-10-CM | POA: Diagnosis not present

## 2021-08-09 DIAGNOSIS — Z0130 Encounter for examination of blood pressure without abnormal findings: Secondary | ICD-10-CM | POA: Diagnosis not present

## 2021-08-09 DIAGNOSIS — E0590 Thyrotoxicosis, unspecified without thyrotoxic crisis or storm: Secondary | ICD-10-CM | POA: Diagnosis not present

## 2021-08-09 DIAGNOSIS — E119 Type 2 diabetes mellitus without complications: Secondary | ICD-10-CM | POA: Diagnosis not present

## 2021-09-24 ENCOUNTER — Ambulatory Visit: Payer: Self-pay | Admitting: Physician Assistant

## 2021-09-24 ENCOUNTER — Encounter: Payer: Self-pay | Admitting: Physician Assistant

## 2021-10-01 ENCOUNTER — Other Ambulatory Visit: Payer: Self-pay | Admitting: Family Medicine

## 2021-10-01 DIAGNOSIS — N201 Calculus of ureter: Secondary | ICD-10-CM

## 2021-10-08 ENCOUNTER — Ambulatory Visit
Admission: RE | Admit: 2021-10-08 | Discharge: 2021-10-08 | Disposition: A | Discharge status: Home / Self Care | Payer: Medicare Other | Source: Ambulatory Visit | Attending: Physician Assistant | Admitting: Physician Assistant

## 2021-10-08 ENCOUNTER — Encounter: Payer: Self-pay | Admitting: Physician Assistant

## 2021-10-08 ENCOUNTER — Ambulatory Visit
Admission: RE | Admit: 2021-10-08 | Discharge: 2021-10-08 | Disposition: A | Discharge status: Home / Self Care | Payer: Medicare Other | Attending: Physician Assistant | Admitting: Physician Assistant

## 2021-10-08 ENCOUNTER — Ambulatory Visit (INDEPENDENT_AMBULATORY_CARE_PROVIDER_SITE_OTHER): Payer: Medicare Other | Admitting: Physician Assistant

## 2021-10-08 VITALS — BP 132/81 | HR 77 | Ht 63.5 in | Wt 170.0 lb

## 2021-10-08 DIAGNOSIS — N200 Calculus of kidney: Secondary | ICD-10-CM | POA: Diagnosis not present

## 2021-10-08 DIAGNOSIS — N201 Calculus of ureter: Secondary | ICD-10-CM | POA: Diagnosis not present

## 2021-10-08 DIAGNOSIS — Z87442 Personal history of urinary calculi: Secondary | ICD-10-CM

## 2021-10-08 NOTE — Progress Notes (Unsigned)
10/08/2021 1:04 PM   Anne Ng Roland Rack [place] Aug 16, 1955 [id number]  CC: Chief Complaint  Patient presents with   Follow-up   Nephrolithiasis    1 year follow-up   HPI: Sarah [name] is a 66 y.o. female with PMH diabetes and nephrolithiasis who presents today for annual stone follow-up.   Today she reports no flank pain, dysuria, or gross hematuria in the past year.    She had some sudden onset vertigo this morning in the setting of recent right ear irrigation and she has noticed some ear pain since.  KUB today with a stable right lower pole stone and numerous phleboliths.  PMH: Past Medical History:  Diagnosis Date   Arthritis    Diabetes mellitus without complication ([place])    Hyperlipidemia    Hypertension    Hyperthyroidism    Thyroid disease     Surgical History: Past Surgical History:  Procedure Laterality Date   ABDOMINAL HYSTERECTOMY     AMPUTATION Left 12/15/2017   Procedure: AMPUTATION DIGIT left long finger;  Surgeon: Dereck Leep, MD;  Location: [facility];  Service: Orthopedics;  Laterality: Left;   BREAST BIOPSY Left 04/13/2021   Stereo Bx, Ribbon Clip. path pending   EXTRACORPOREAL SHOCK WAVE LITHOTRIPSY Right 08/13/2020   Procedure: EXTRACORPOREAL SHOCK WAVE LITHOTRIPSY (ESWL);  Surgeon: Hollice Espy, MD;  Location: [facility];  Service: Urology;  Laterality: Right;   MM BREAST STEREO BIOPSY LEFT ([place] HX) Left 04/13/2021   calcs, ribbon clip, path pending.   PARTIAL HYSTERECTOMY     TOTAL HIP ARTHROPLASTY Left 11/13/2019   Procedure: TOTAL HIP ARTHROPLASTY ANTERIOR APPROACH;  Surgeon: Lovell Sheehan, MD;  Location: [facility];  Service: Orthopedics;  Laterality: Left;    Home Medications:  Allergies as of 10/08/2021       Reactions   Ace Inhibitors Nausea Only   Farxiga [dapagliflozin] Other (See Comments)   Vaginitis        Medication List        Accurate as of October 08, 2021  1:04 PM. If you have any questions, ask  your nurse or doctor.          STOP taking these medications    tamsulosin 0.4 MG Caps capsule Commonly known as: FLOMAX Stopped by: Debroah Loop, PA-C       TAKE these medications    amLODipine 5 MG tablet Commonly known as: NORVASC Take 1 tablet (5 mg total) by mouth daily.   atorvastatin 10 MG tablet Commonly known as: LIPITOR Take 1 tablet (10 mg total) by mouth daily.   empagliflozin 10 MG Tabs tablet Commonly known as: JARDIANCE Take 10 mg by mouth daily.   ibuprofen 200 MG tablet Commonly known as: ADVIL Take 400 mg by mouth every 6 (six) hours as needed.   losartan 25 MG tablet Commonly known as: COZAAR Take 25 mg by mouth daily. What changed: Another medication with the same name was removed. Continue taking this medication, and follow the directions you see here. Changed by: Debroah Loop, PA-C   metFORMIN 1000 MG tablet Commonly known as: GLUCOPHAGE Take 1 tablet by mouth 2 (two) times daily. What changed: Another medication with the same name was removed. Continue taking this medication, and follow the directions you see here. Changed by: Debroah Loop, PA-C   methimazole 5 MG tablet Commonly known as: TAPAZOLE Take 10 mg by mouth daily.        Allergies:  Allergies  Allergen Reactions   Ace  Inhibitors Nausea Only   Farxiga [Dapagliflozin] Other (See Comments)    Vaginitis     Family History: Family History  Problem Relation Age of Onset   Diabetes Mother    Diabetes Sister    Stroke Sister    Breast cancer Neg Hx     Social History:   reports that she quit smoking about 2 years ago. Her smoking use included cigarettes. She has been exposed to tobacco smoke. She has never used smokeless tobacco. She reports that she does not drink alcohol and does not use drugs.  Physical Exam: BP 132/81   Pulse 77   Ht 5' 3.5" (1.613 m)   Wt 170 lb (77.1 kg)   BMI 29.64 kg/m   Constitutional:  Alert and oriented, no  acute distress, nontoxic appearing HEENT: [place], AT Cardiovascular: No clubbing, cyanosis, or edema Respiratory: Normal respiratory effort, no increased work of breathing Skin: No rashes, bruises or suspicious lesions Neurologic: Grossly intact, no focal deficits, moving all 4 extremities Psychiatric: Normal mood and affect  Pertinent Imaging: KUB, 10/08/2021: CLINICAL DATA:  Kidney stone follow-up   EXAM: ABDOMEN - 1 VIEW   COMPARISON:  September 24, 2020   FINDINGS: A single stone measuring 5 mm is identified in the lower pole of the right kidney. The apparent right ureteral stones at the L3 level are no longer visualized. No definite right ureteral stones identified today. No left-sided renal or ureteral stones noted. No other acute abnormalities.   IMPRESSION: Persistent 5 mm stone in the right kidney. No other renal or ureteral stones identified.     Electronically Signed   By: Dorise Bullion III M.D.   On: 10/09/2021 22:06  I personally reviewed the images referenced above and note stable right renal stone and phleboliths.  Assessment & Plan:   1. History of nephrolithiasis Asymptomatic in the past year.  No new stones or significant interval stone growth per KUB.  She prefers to continue with annual follow-up with KUB prior.  We discussed return precautions including flank pain, gross hematuria, and dysuria.  Return for 1 year follow-up with KUB.  Debroah Loop, PA-C  [facility] [address] [place],  [address] [phone]

## 2021-11-04 DIAGNOSIS — Z0130 Encounter for examination of blood pressure without abnormal findings: Secondary | ICD-10-CM | POA: Diagnosis not present

## 2021-11-04 DIAGNOSIS — I10 Essential (primary) hypertension: Secondary | ICD-10-CM | POA: Diagnosis not present

## 2021-11-04 DIAGNOSIS — E119 Type 2 diabetes mellitus without complications: Secondary | ICD-10-CM | POA: Diagnosis not present

## 2021-11-04 DIAGNOSIS — Z1389 Encounter for screening for other disorder: Secondary | ICD-10-CM | POA: Diagnosis not present

## 2021-11-04 DIAGNOSIS — E785 Hyperlipidemia, unspecified: Secondary | ICD-10-CM | POA: Diagnosis not present

## 2021-12-23 DIAGNOSIS — E119 Type 2 diabetes mellitus without complications: Secondary | ICD-10-CM | POA: Diagnosis not present

## 2021-12-23 DIAGNOSIS — Z23 Encounter for immunization: Secondary | ICD-10-CM | POA: Diagnosis not present

## 2022-01-17 ENCOUNTER — Other Ambulatory Visit: Payer: Self-pay | Admitting: Addiction Medicine

## 2022-01-28 ENCOUNTER — Other Ambulatory Visit: Payer: Self-pay | Admitting: Family Medicine

## 2022-01-28 DIAGNOSIS — Z1231 Encounter for screening mammogram for malignant neoplasm of breast: Secondary | ICD-10-CM

## 2022-02-03 DIAGNOSIS — E119 Type 2 diabetes mellitus without complications: Secondary | ICD-10-CM | POA: Diagnosis not present

## 2022-02-04 DIAGNOSIS — Z23 Encounter for immunization: Secondary | ICD-10-CM | POA: Diagnosis not present

## 2022-03-02 IMAGING — CR DG ABDOMEN 1V
1 series · 2 of 2 positions shown · non-contrast
Comparison: CT 08/05/2020.

CLINICAL DATA: Preprocedural study.  Kidney stones.

EXAM:
ABDOMEN - 1 VIEW

[Series 1: dg abd 1 view · 0.14mm/px · 2 of 2 slices shown]
[im 1/2]
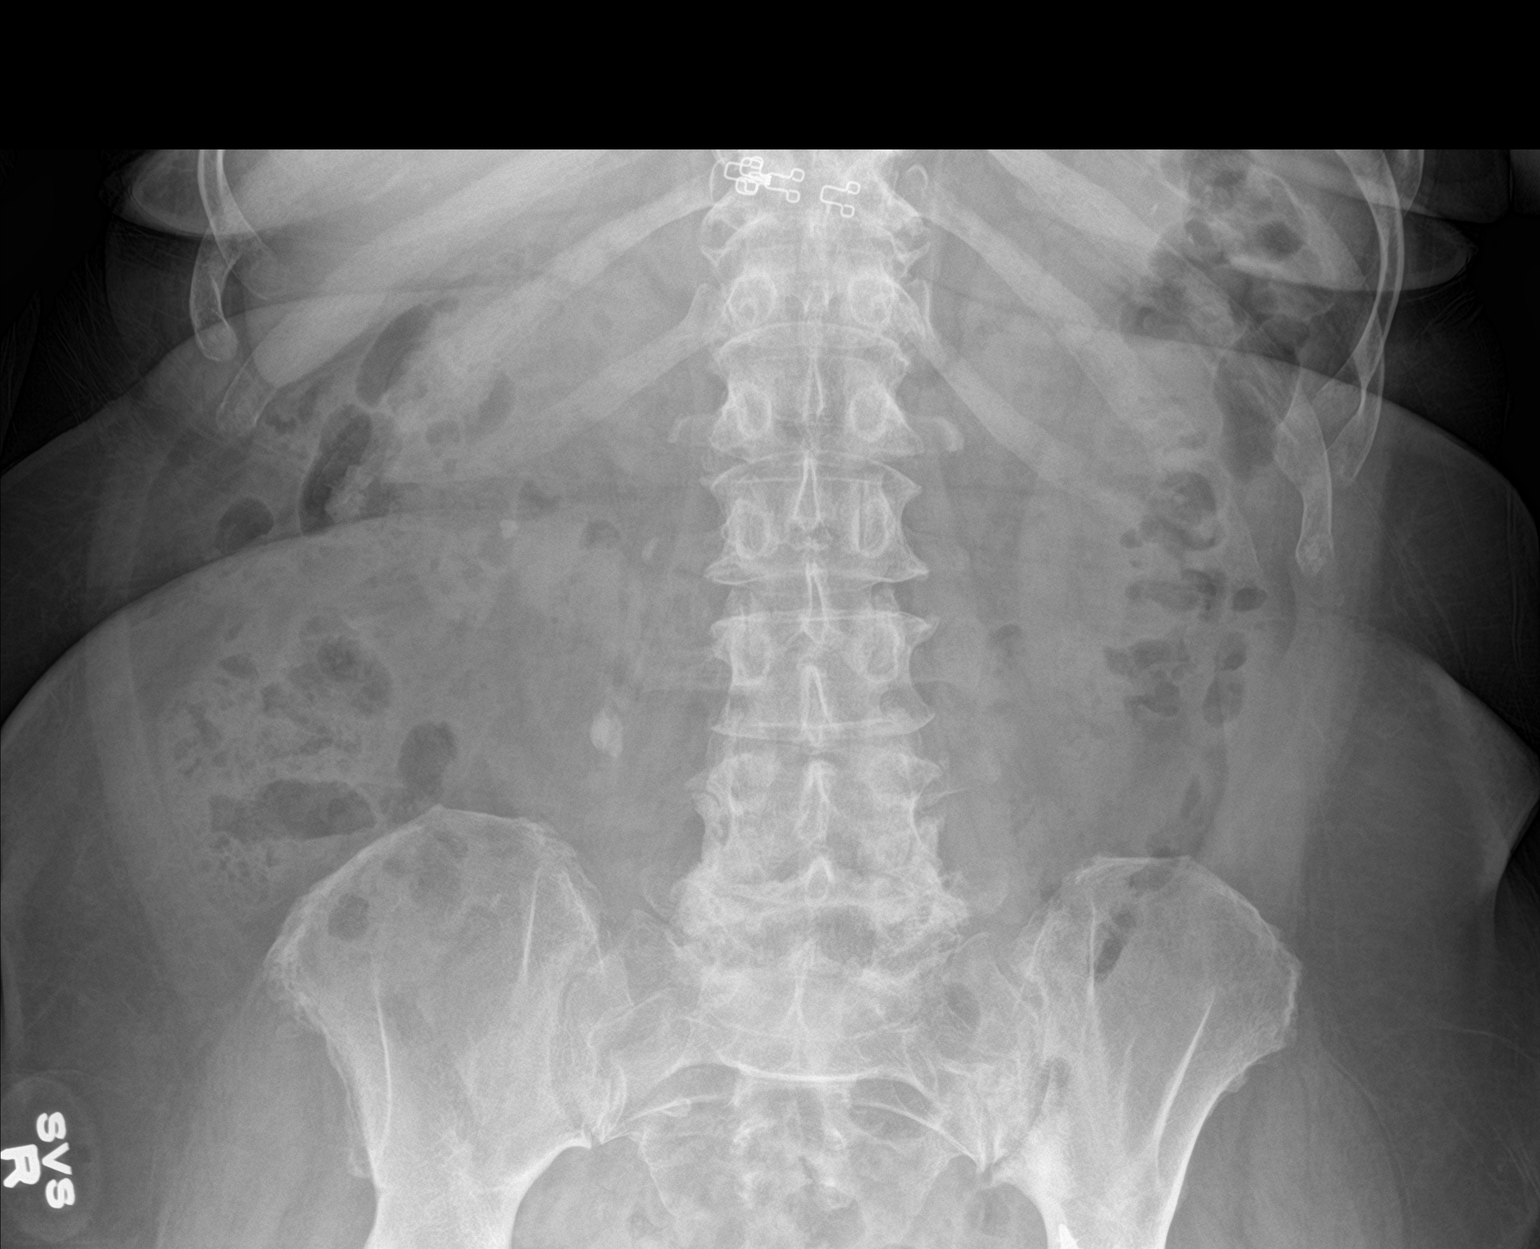
[im 2/2]
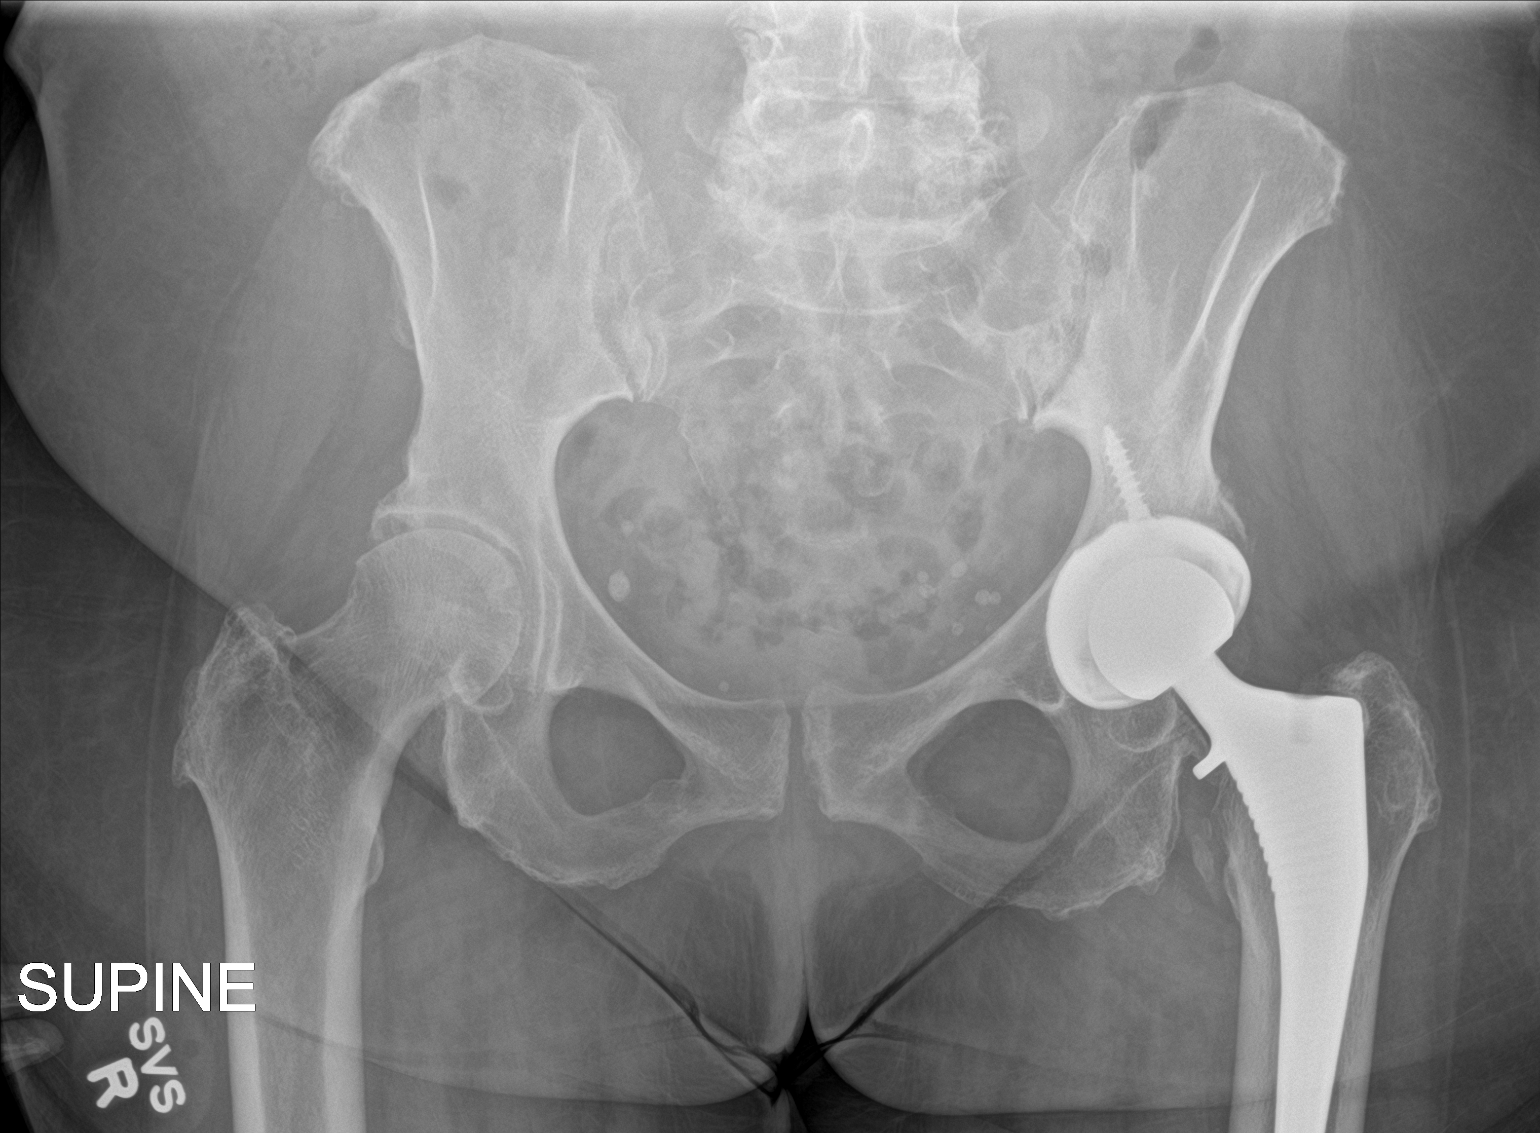

[2 of 2 positions shown; findings below may reference images not displayed]

FINDINGS: Large right mid ureteral stone again noted in similar position over
the right mid ureter. Right renal stone again noted. Tiny left renal
stone best identified by prior CT. Multiple pelvic calcifications
again noted, most consistent phleboliths. No bowel distention. Stool
noted throughout the colon. No free air. Degenerative change lumbar
spine and right hip. Postsurgical changes left hip.
IMPRESSION: 1. Large mid right ureteral stone again noted in similar position
over the right mid ureter.

2. Right renal stone again noted. Tiny left renal stone best
identified by prior CT.

## 2022-03-15 IMAGING — CR DG ABDOMEN 1V
1 series · 2 of 2 positions shown · non-contrast
Comparison: Abdominal radiograph dated 08/13/2020 and CT dated
08/05/2020.

CLINICAL DATA: 64-year-old female with right ureteral stone.

EXAM:
ABDOMEN - 1 VIEW

[Series 1: t abdomen supine · 0.14mm/px · 2 of 2 slices shown]
[im 1/2]
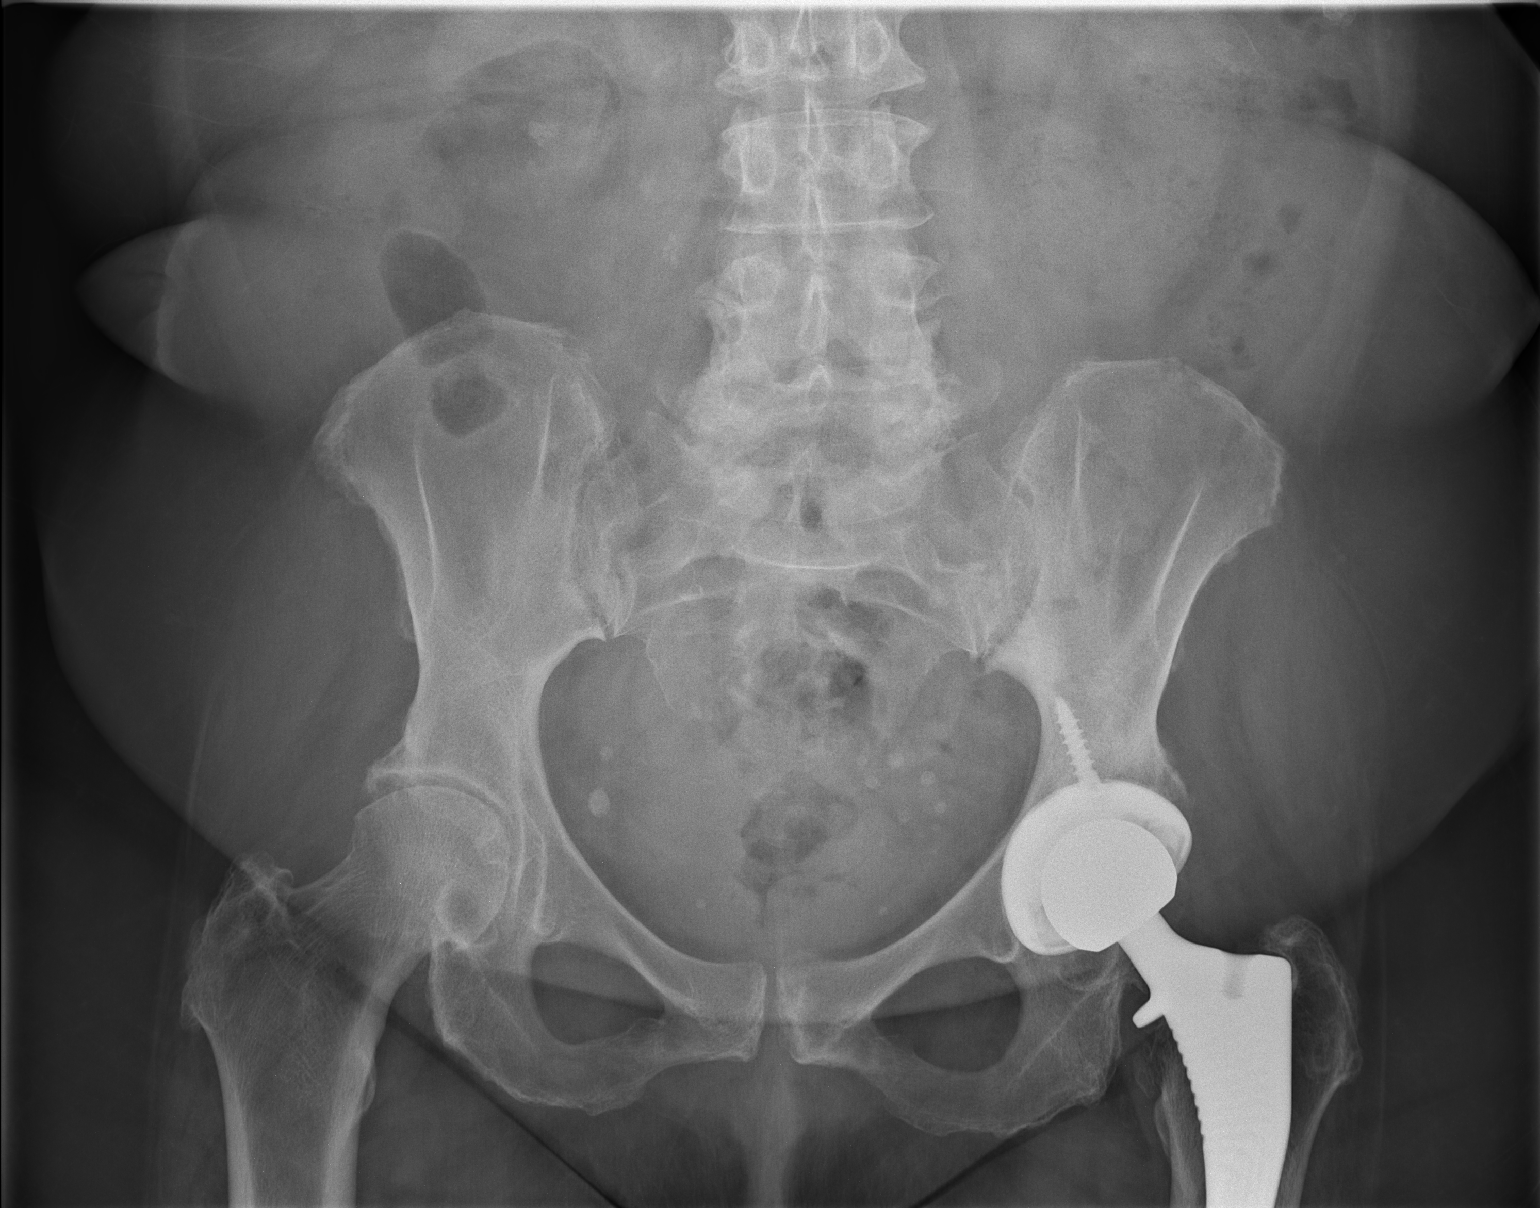
[im 2/2]
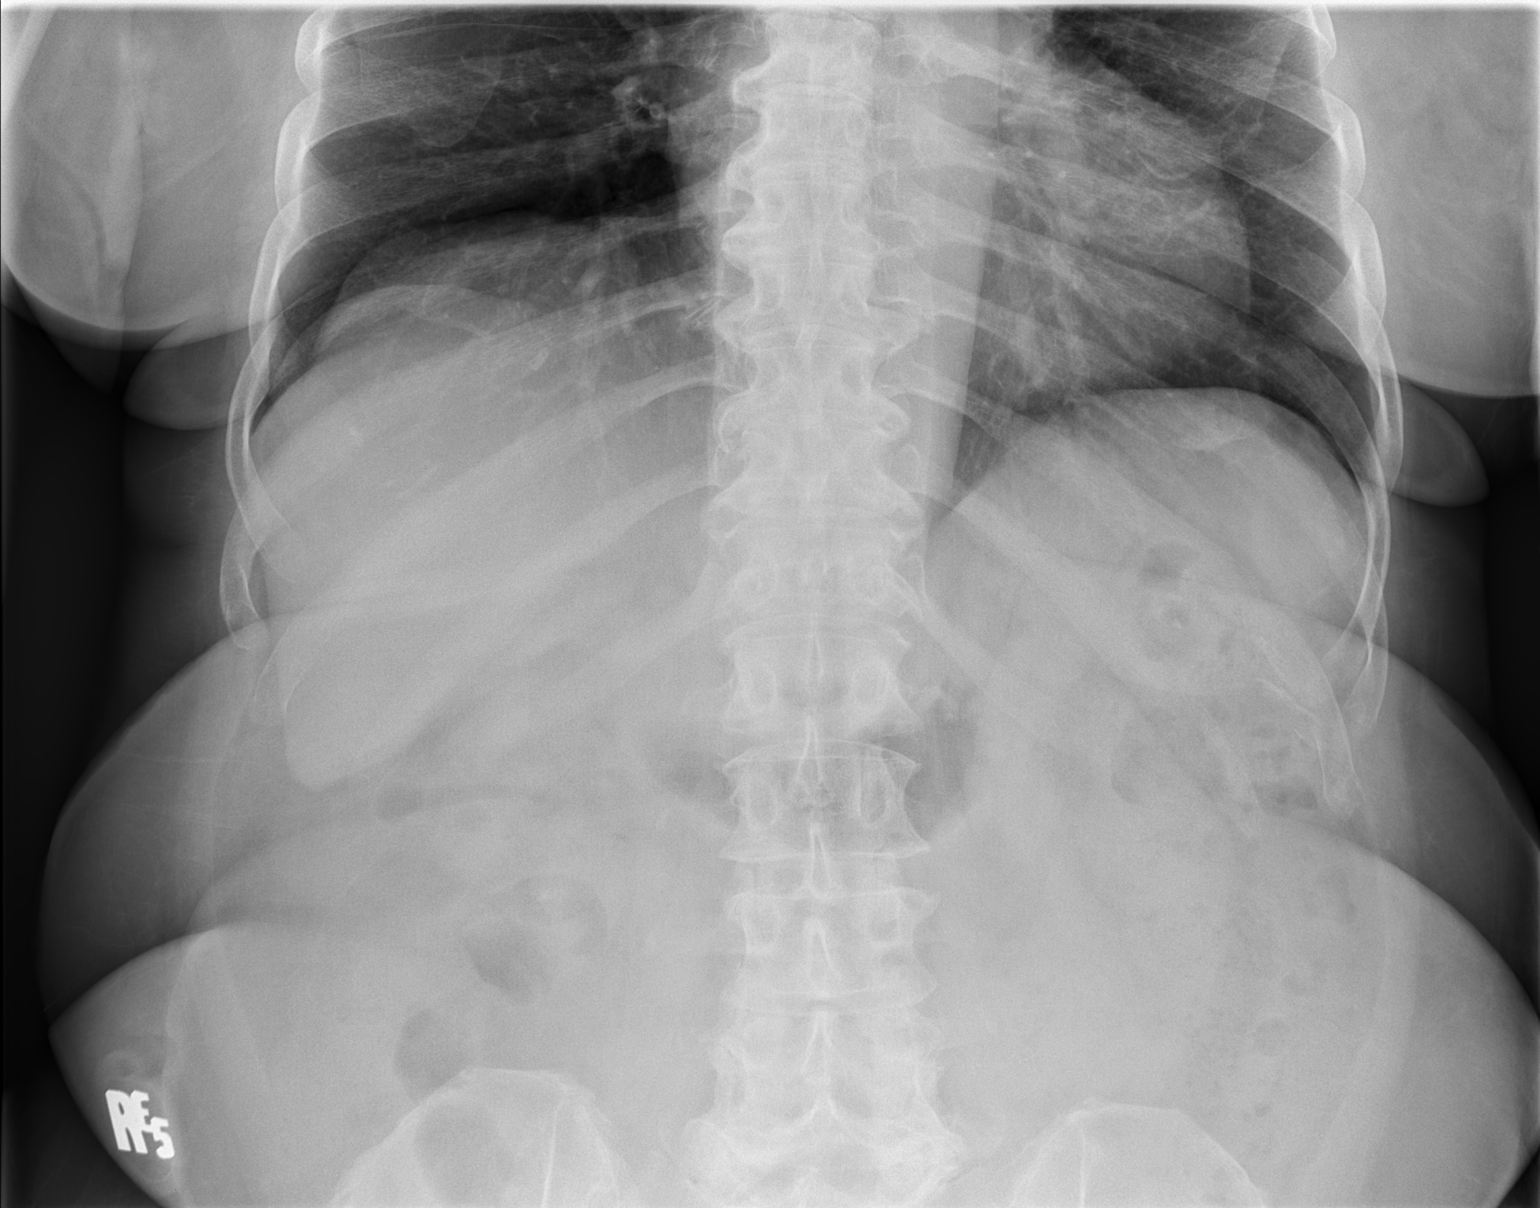

[2 of 2 positions shown; findings below may reference images not displayed]

FINDINGS: The previously seen stone in the right ureter is not identified on
today's study. Small radiopaque focus over the inferior pole of the
right kidney again noted corresponding to the previously seen stone.

There is no bowel dilatation or evidence of obstruction. No free
air. Degenerative changes of the spine. Left hip arthroplasty. No
acute osseous pathology.
IMPRESSION: Nonvisualization of the previously seen right ureteral calculus.

## 2022-03-25 ENCOUNTER — Ambulatory Visit
Admission: RE | Admit: 2022-03-25 | Discharge: 2022-03-25 | Disposition: A | Discharge status: Home / Self Care | Payer: Medicare Other | Source: Ambulatory Visit | Attending: Family Medicine | Admitting: Family Medicine

## 2022-03-25 DIAGNOSIS — Z1231 Encounter for screening mammogram for malignant neoplasm of breast: Secondary | ICD-10-CM | POA: Insufficient documentation

## 2022-04-04 DIAGNOSIS — E0500 Thyrotoxicosis with diffuse goiter without thyrotoxic crisis or storm: Secondary | ICD-10-CM | POA: Diagnosis not present

## 2022-04-04 DIAGNOSIS — Z712 Person consulting for explanation of examination or test findings: Secondary | ICD-10-CM | POA: Diagnosis not present

## 2022-04-04 DIAGNOSIS — Z0131 Encounter for examination of blood pressure with abnormal findings: Secondary | ICD-10-CM | POA: Diagnosis not present

## 2022-04-04 DIAGNOSIS — Z0000 Encounter for general adult medical examination without abnormal findings: Secondary | ICD-10-CM | POA: Diagnosis not present

## 2022-04-04 DIAGNOSIS — I10 Essential (primary) hypertension: Secondary | ICD-10-CM | POA: Diagnosis not present

## 2022-04-04 DIAGNOSIS — E119 Type 2 diabetes mellitus without complications: Secondary | ICD-10-CM | POA: Diagnosis not present

## 2022-04-04 DIAGNOSIS — Z1389 Encounter for screening for other disorder: Secondary | ICD-10-CM | POA: Diagnosis not present

## 2022-04-13 IMAGING — CR DG ABDOMEN 1V
1 series · 2 of 2 positions shown · non-contrast
Comparison: 09/10/2020

CLINICAL DATA: Post ESWL right ureteral stone

EXAM:
ABDOMEN - 1 VIEW

[Series 1: dg abd 1 view · 0.14mm/px · 2 of 2 slices shown]
[im 1/2]
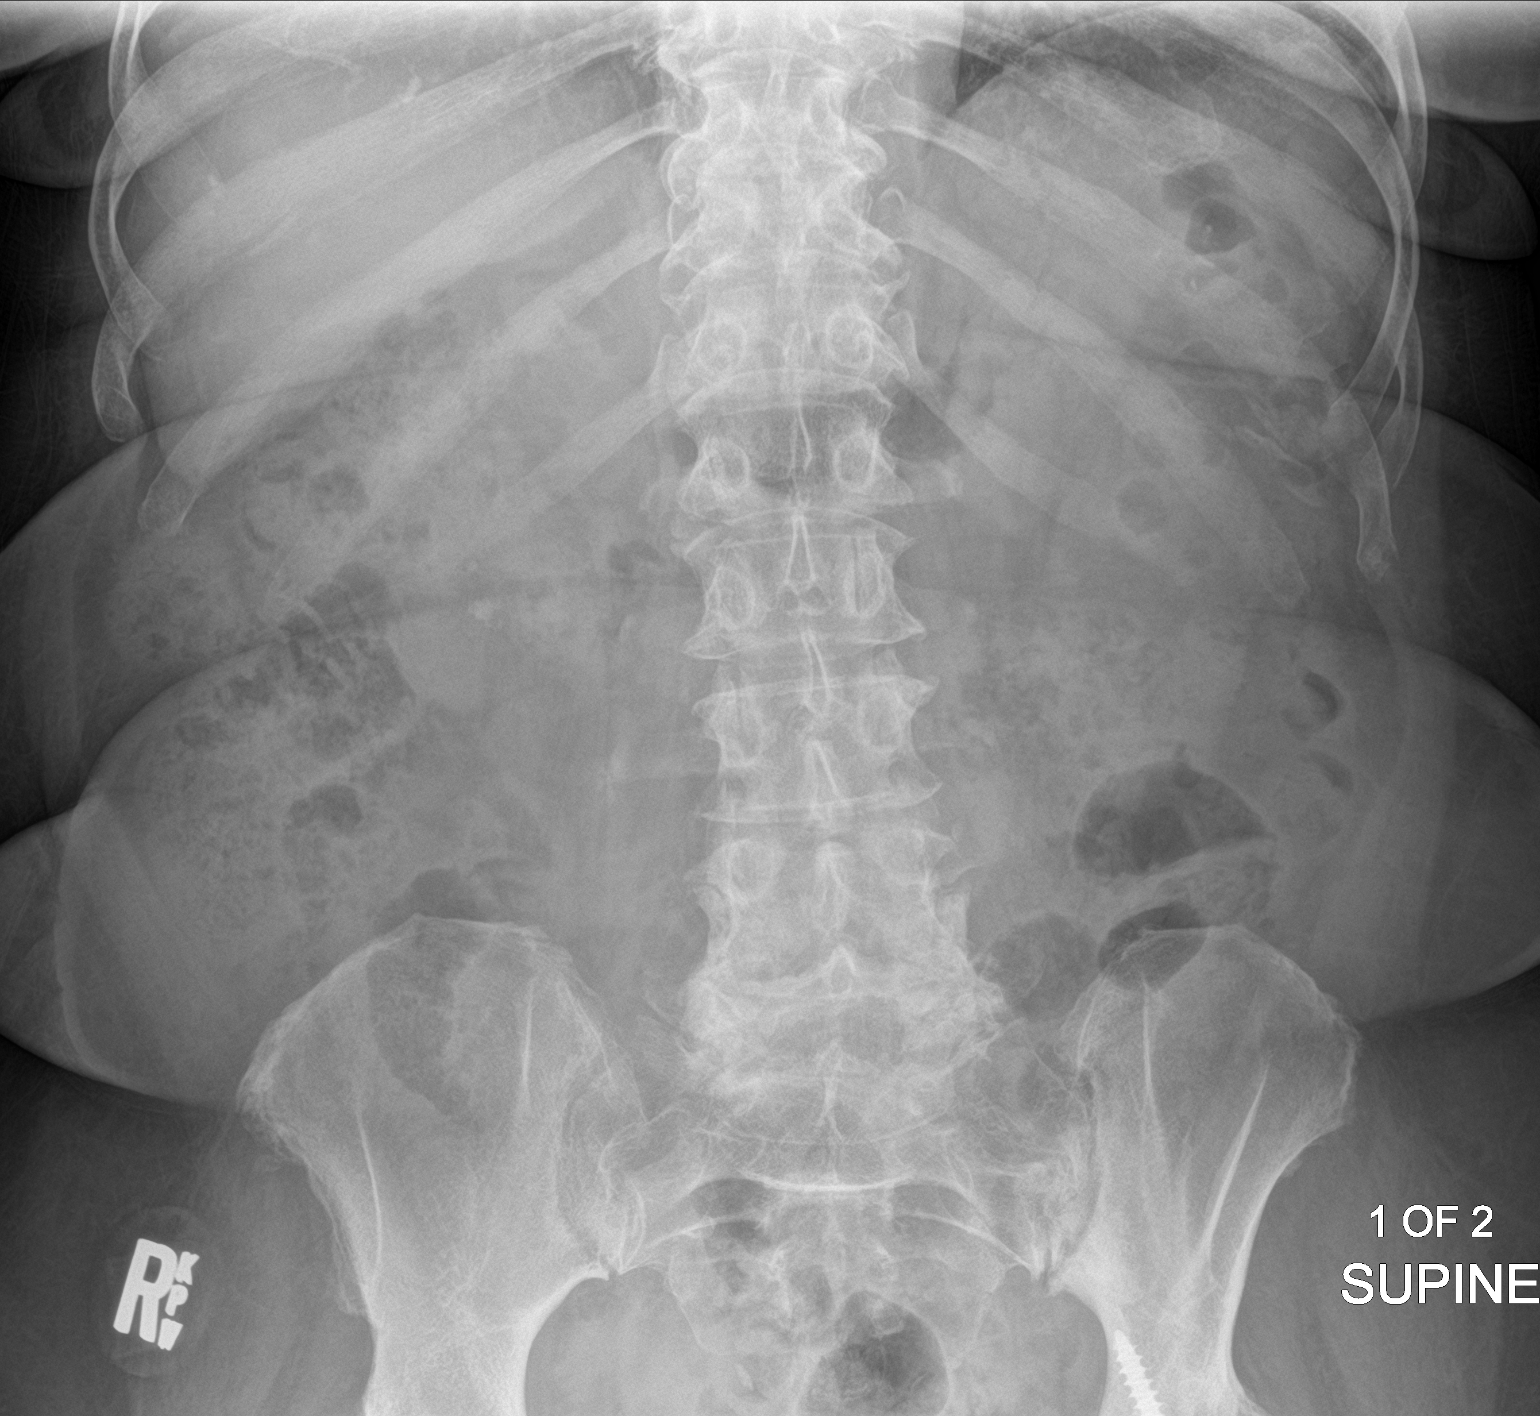
[im 2/2]
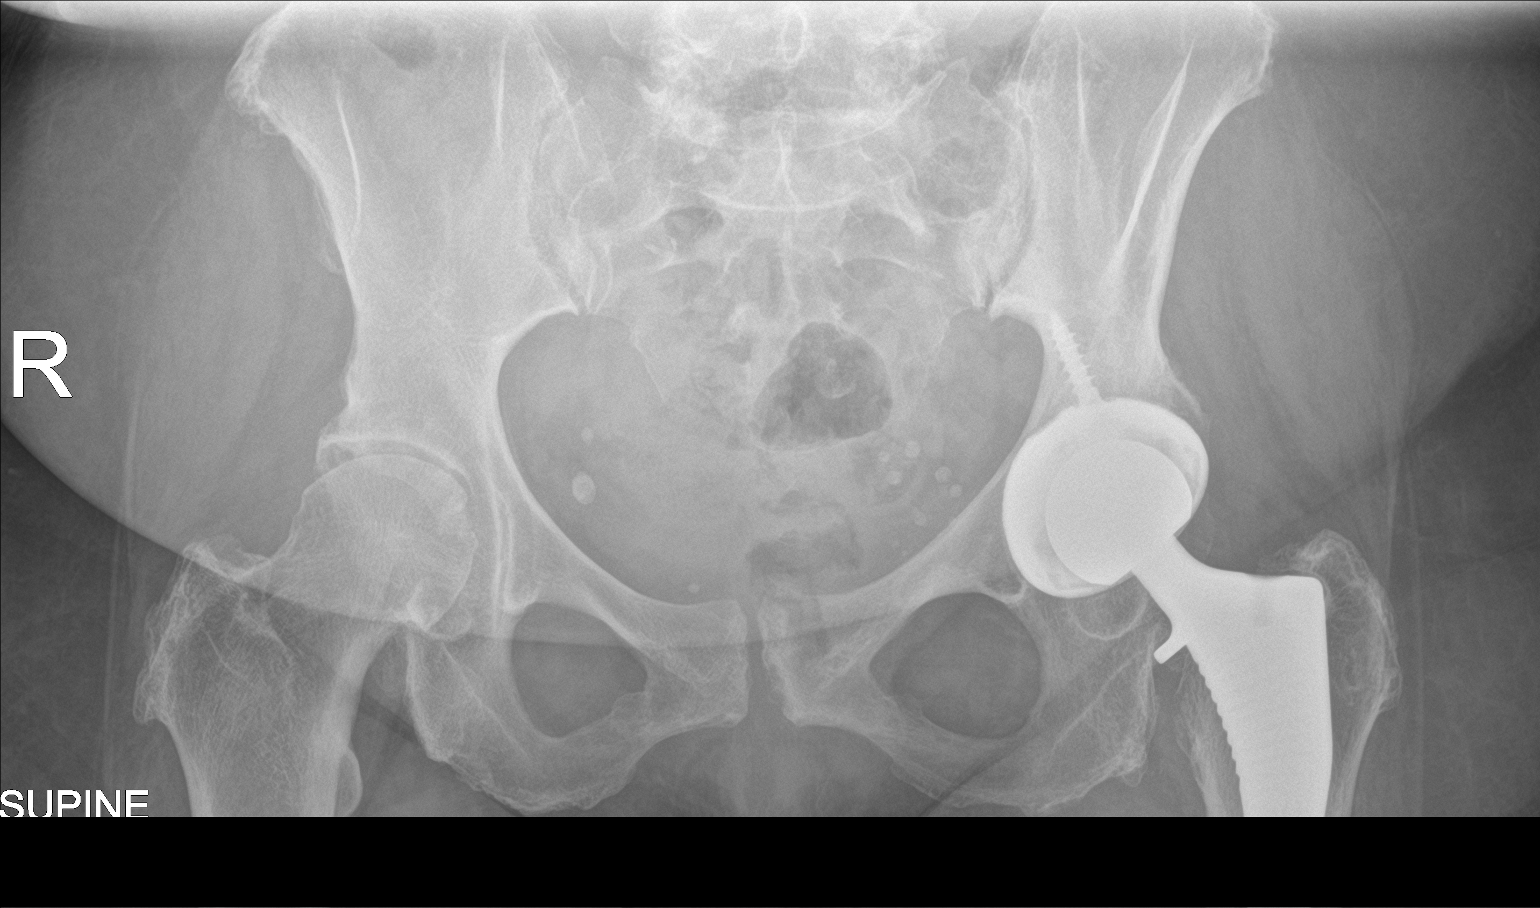

[2 of 2 positions shown; findings below may reference images not displayed]

FINDINGS: Cluster of calculi right lower pole largest approximately 7 mm
unchanged. Possible fragments of prior calculus in the mid right
ureter at the L3 level unchanged from the recent study. Small left
renal calculus better seen on prior studies.

Normal bowel gas pattern.  Left hip replacement.
IMPRESSION: Right lower pole renal calculi unchanged.

Possible fragments of stone remaining in the right ureter to the
right of L3.

## 2022-04-14 ENCOUNTER — Telehealth: Payer: Self-pay

## 2022-04-14 ENCOUNTER — Other Ambulatory Visit: Payer: Self-pay | Admitting: Nurse Practitioner

## 2022-04-14 ENCOUNTER — Other Ambulatory Visit: Payer: Self-pay

## 2022-04-14 DIAGNOSIS — Z780 Asymptomatic menopausal state: Secondary | ICD-10-CM

## 2022-04-14 DIAGNOSIS — Z1211 Encounter for screening for malignant neoplasm of colon: Secondary | ICD-10-CM

## 2022-04-14 MED ORDER — NA SULFATE-K SULFATE-MG SULF 17.5-3.13-1.6 GM/177ML PO SOLN: 1.0000 | Freq: Once | ORAL | 0 refills | Status: AC

## 2022-04-14 NOTE — Telephone Encounter (Signed)
Gastroenterology Pre-Procedure Review  Request Date: 04/28/22 Requesting Physician: Dr. Vicente Males  PATIENT REVIEW QUESTIONS: The patient responded to the following health history questions as indicated:    1. Are you having any GI issues? no 2. Do you have a personal history of Polyps? no 3. Do you have a family history of Colon Cancer or Polyps? no 4. Diabetes Mellitus? yes (has been advised to stop Jardiance, [place] and Metformin 3 days) 5. Joint replacements in the past 12 months?yes (hip surgery years ago left hip) 6. Major health problems in the past 3 months?no 7. Any artificial heart valves, MVP, or defibrillator?no    MEDICATIONS & ALLERGIES:    Patient reports the following regarding taking any anticoagulation/antiplatelet therapy:   Plavix, Coumadin, Eliquis, Xarelto, Lovenox, Pradaxa, Brilinta, or Effient? no Aspirin? no  Patient confirms/reports the following medications:  Current Outpatient Medications  Medication Sig Dispense Refill   amLODipine (NORVASC) 5 MG tablet Take 1 tablet (5 mg total) by mouth daily. 90 tablet 0   atorvastatin (LIPITOR) 10 MG tablet Take 1 tablet (10 mg total) by mouth daily. 30 tablet 0   empagliflozin (JARDIANCE) 10 MG TABS tablet Take 10 mg by mouth daily.     ibuprofen (ADVIL) 200 MG tablet Take 400 mg by mouth every 6 (six) hours as needed.     losartan (COZAAR) 25 MG tablet Take 25 mg by mouth daily.     metFORMIN (GLUCOPHAGE) 1000 MG tablet Take 1 tablet by mouth 2 (two) times daily.     methimazole (TAPAZOLE) 5 MG tablet Take 10 mg by mouth daily.     No current facility-administered medications for this visit.    Patient confirms/reports the following allergies:  Allergies  Allergen Reactions   Ace Inhibitors Nausea Only   Farxiga [Dapagliflozin] Other (See Comments)    Vaginitis     No orders of the defined types were placed in this encounter.   AUTHORIZATION INFORMATION Primary Insurance: 1D#: Group #:  Secondary  Insurance: 1D#: Group #:  SCHEDULE INFORMATION: Date: 04/28/22 Time: Location: [facility]

## 2022-04-27 ENCOUNTER — Encounter: Payer: Self-pay | Admitting: Gastroenterology

## 2022-04-28 ENCOUNTER — Ambulatory Visit
Admission: RE | Admit: 2022-04-28 | Discharge: 2022-04-28 | Disposition: A | Discharge status: Home / Self Care | Payer: 59 | Attending: Gastroenterology | Admitting: Gastroenterology

## 2022-04-28 ENCOUNTER — Encounter: Payer: Self-pay | Admitting: Gastroenterology

## 2022-04-28 ENCOUNTER — Encounter
Admission: RE | Disposition: A | Discharge status: Home / Self Care | Payer: Self-pay | Source: Home / Self Care | Attending: Gastroenterology

## 2022-04-28 DIAGNOSIS — Z1211 Encounter for screening for malignant neoplasm of colon: Secondary | ICD-10-CM

## 2022-04-28 DIAGNOSIS — Z539 Procedure and treatment not carried out, unspecified reason: Secondary | ICD-10-CM | POA: Diagnosis not present

## 2022-04-28 HISTORY — PX: COLONOSCOPY WITH PROPOFOL: SHX5780

## 2022-04-28 SURGERY — COLONOSCOPY WITH PROPOFOL
Anesthesia: General

## 2022-04-28 MED ORDER — SODIUM CHLORIDE 0.9 % IV SOLN: INTRAVENOUS | Status: DC

## 2022-04-28 NOTE — Progress Notes (Signed)
Ms. Aronoff did not drink all of her prep, states that output is still brown and she could not see the bottom of the toilet. She also admits to "snacking, but nothing heavy" yesterday. Ms. Capasso is going to call Dr. Georgeann Oppenheim office to reschedule, telephone number has been provided to her. She was also advised to only consume clear liquids the day prior to the procedure.

## 2022-04-29 ENCOUNTER — Encounter: Payer: Self-pay | Admitting: Gastroenterology

## 2022-05-05 ENCOUNTER — Other Ambulatory Visit: Payer: Self-pay

## 2022-05-05 ENCOUNTER — Telehealth: Payer: Self-pay

## 2022-05-05 ENCOUNTER — Telehealth: Payer: Self-pay | Admitting: Gastroenterology

## 2022-05-05 DIAGNOSIS — Z1211 Encounter for screening for malignant neoplasm of colon: Secondary | ICD-10-CM

## 2022-05-05 NOTE — Telephone Encounter (Signed)
Colonoscopy has been rescheduled to 05/12/22.  Instructions reviewed with patient by phone in detail to advising of stop dates for diabetic medications 05/09/22 and diet reviewed, along with instructions for her Suprep.  She had not picked up her rx yet but has been asked to go pick up today.  Thanks, [place], [place]

## 2022-05-05 NOTE — Telephone Encounter (Signed)
error 

## 2022-05-05 NOTE — Telephone Encounter (Signed)
Pt left message to rechedule colonoscopy Callback -[phone]

## 2022-05-11 NOTE — Telephone Encounter (Signed)
Patient contacted office to get advice on having her colonoscopy.  She said she went out to eat with her sisters this morning and she figured her prep was going to clean out whatever she ate this morning.  I informed her that the breakfast she had this morning was not on her clear liquid diet and it is important that she follows the clear liquid diet.  Although she verbalized understanding she still seemed confused.  I asked if there was anyone at home with her that I could speak with in addition to go over the instructions.  She said no.  I asked her if she wanted to have the colonoscopy as she seemed to be reluctant with having it done.  She said really no she does not want to have it.  She wanted to have a stool test.  I've asked her to call her PCP to have them to arrange it for her, and if later she decides she wants to move forward with the colonoscopy we will allow 1 more reschedule.  She has had two at this time.  Thanks, [place], [place]

## 2022-05-12 ENCOUNTER — Encounter: Admission: RE | Payer: Self-pay | Source: Home / Self Care

## 2022-05-12 ENCOUNTER — Ambulatory Visit: Admission: RE | Admit: 2022-05-12 | Payer: 59 | Source: Home / Self Care | Admitting: Gastroenterology

## 2022-05-12 SURGERY — COLONOSCOPY WITH PROPOFOL
Anesthesia: General

## 2022-06-01 DIAGNOSIS — E0500 Thyrotoxicosis with diffuse goiter without thyrotoxic crisis or storm: Secondary | ICD-10-CM | POA: Diagnosis not present

## 2022-06-01 DIAGNOSIS — E119 Type 2 diabetes mellitus without complications: Secondary | ICD-10-CM | POA: Diagnosis not present

## 2022-06-06 DIAGNOSIS — Z1211 Encounter for screening for malignant neoplasm of colon: Secondary | ICD-10-CM | POA: Diagnosis not present

## 2022-07-22 DIAGNOSIS — H2513 Age-related nuclear cataract, bilateral: Secondary | ICD-10-CM | POA: Diagnosis not present

## 2022-07-22 DIAGNOSIS — E119 Type 2 diabetes mellitus without complications: Secondary | ICD-10-CM | POA: Diagnosis not present

## 2022-07-22 DIAGNOSIS — H40003 Preglaucoma, unspecified, bilateral: Secondary | ICD-10-CM | POA: Diagnosis not present

## 2022-07-27 DIAGNOSIS — Z712 Person consulting for explanation of examination or test findings: Secondary | ICD-10-CM | POA: Diagnosis not present

## 2022-07-27 DIAGNOSIS — Z0131 Encounter for examination of blood pressure with abnormal findings: Secondary | ICD-10-CM | POA: Diagnosis not present

## 2022-07-27 DIAGNOSIS — Z1389 Encounter for screening for other disorder: Secondary | ICD-10-CM | POA: Diagnosis not present

## 2022-07-27 DIAGNOSIS — E119 Type 2 diabetes mellitus without complications: Secondary | ICD-10-CM | POA: Diagnosis not present

## 2022-07-27 DIAGNOSIS — Z0130 Encounter for examination of blood pressure without abnormal findings: Secondary | ICD-10-CM | POA: Diagnosis not present

## 2022-10-14 ENCOUNTER — Ambulatory Visit
Admission: RE | Admit: 2022-10-14 | Discharge: 2022-10-14 | Disposition: A | Discharge status: Home / Self Care | Payer: 59 | Attending: Physician Assistant | Admitting: Physician Assistant

## 2022-10-14 ENCOUNTER — Ambulatory Visit (INDEPENDENT_AMBULATORY_CARE_PROVIDER_SITE_OTHER): Payer: Self-pay | Admitting: Physician Assistant

## 2022-10-14 ENCOUNTER — Other Ambulatory Visit: Payer: Self-pay | Admitting: Physician Assistant

## 2022-10-14 ENCOUNTER — Ambulatory Visit
Admission: RE | Admit: 2022-10-14 | Discharge: 2022-10-14 | Disposition: A | Discharge status: Home / Self Care | Payer: 59 | Source: Ambulatory Visit | Attending: Physician Assistant | Admitting: Physician Assistant

## 2022-10-14 ENCOUNTER — Other Ambulatory Visit: Payer: Self-pay | Admitting: *Deleted

## 2022-10-14 ENCOUNTER — Encounter: Payer: Self-pay | Admitting: Physician Assistant

## 2022-10-14 VITALS — BP 134/74 | HR 81 | Ht 63.0 in | Wt 171.0 lb

## 2022-10-14 DIAGNOSIS — N200 Calculus of kidney: Secondary | ICD-10-CM | POA: Diagnosis not present

## 2022-10-14 DIAGNOSIS — Z87442 Personal history of urinary calculi: Secondary | ICD-10-CM

## 2022-10-14 DIAGNOSIS — Z09 Encounter for follow-up examination after completed treatment for conditions other than malignant neoplasm: Secondary | ICD-10-CM

## 2022-10-14 NOTE — Progress Notes (Signed)
10/14/2022 11:45 AM   Lewis Shock [place] 1956/02/25 [id number]  CC: Chief Complaint  Patient presents with   Follow-up    1 year follow-up   HPI: Sarah [name] is a 67 y.o. female with PMH diabetes and nephrolithiasis who presents today for annual stone follow-up.   Today she reports no significant changes in her health over the past year. She tries to stay well hydrated with water for stone prevention. No UTIs, flank pain, or gross hematuria since her last visit.  KUB today with a stable right lower pole stone; there are two punctate radiopaque densities adjacent to this that could represent small renal stones vs bowel contents.  PMH: Past Medical History:  Diagnosis Date   Arthritis    Diabetes mellitus without complication ([facility])    Hyperlipidemia    Hypertension    Hyperthyroidism    Thyroid disease     Surgical History: Past Surgical History:  Procedure Laterality Date   ABDOMINAL HYSTERECTOMY     AMPUTATION Left 12/15/2017   Procedure: AMPUTATION DIGIT left long finger;  Surgeon: Donato Heinz, MD;  Location: [facility];  Service: Orthopedics;  Laterality: Left;   BREAST BIOPSY Left 04/13/2021   Stereo Bx, Ribbon Clip. path pending   COLONOSCOPY WITH PROPOFOL N/A 04/28/2022   Procedure: COLONOSCOPY WITH PROPOFOL;  Surgeon: Wyline Mood, MD;  Location: [facility];  Service: Gastroenterology;  Laterality: N/A;   EXTRACORPOREAL SHOCK WAVE LITHOTRIPSY Right 08/13/2020   Procedure: EXTRACORPOREAL SHOCK WAVE LITHOTRIPSY (ESWL);  Surgeon: Vanna Scotland, MD;  Location: [facility];  Service: Urology;  Laterality: Right;   MM BREAST STEREO BIOPSY LEFT ([facility]) Left 04/13/2021   calcs, ribbon clip, path pending.   PARTIAL HYSTERECTOMY     TOTAL HIP ARTHROPLASTY Left 11/13/2019   Procedure: TOTAL HIP ARTHROPLASTY ANTERIOR APPROACH;  Surgeon: Lyndle Herrlich, MD;  Location: [facility];  Service: Orthopedics;  Laterality: Left;    Home Medications:  Allergies as  of 10/14/2022       Reactions   Ace Inhibitors Nausea Only   Farxiga [dapagliflozin] Other (See Comments)   Vaginitis        Medication List        Accurate as of October 14, 2022 11:45 AM. If you have any questions, ask your nurse or doctor.          amLODipine 5 MG tablet Commonly known as: NORVASC Take 1 tablet (5 mg total) by mouth daily.   atorvastatin 10 MG tablet Commonly known as: LIPITOR Take 1 tablet (10 mg total) by mouth daily.   empagliflozin 10 MG Tabs tablet Commonly known as: JARDIANCE Take 10 mg by mouth daily.   ibuprofen 200 MG tablet Commonly known as: ADVIL Take 400 mg by mouth every 6 (six) hours as needed.   losartan 25 MG tablet Commonly known as: COZAAR Take 25 mg by mouth daily.   metFORMIN 1000 MG tablet Commonly known as: GLUCOPHAGE Take 1 tablet by mouth 2 (two) times daily.   methimazole 5 MG tablet Commonly known as: TAPAZOLE Take 10 mg by mouth daily.        Allergies:  Allergies  Allergen Reactions   Ace Inhibitors Nausea Only   Farxiga [Dapagliflozin] Other (See Comments)    Vaginitis     Family History: Family History  Problem Relation Age of Onset   Diabetes Mother    Diabetes Sister    Stroke Sister    Breast cancer Neg Hx     Social  History:   reports that she quit smoking about 3 years ago. Her smoking use included cigarettes. She has been exposed to tobacco smoke. She has never used smokeless tobacco. She reports that she does not drink alcohol and does not use drugs.  Physical Exam: BP 134/74   Pulse 81   Ht 5\' 3"  (1.6 m)   Wt 171 lb (77.6 kg)   BMI 30.29 kg/m   Constitutional:  Alert and oriented, no acute distress, nontoxic appearing HEENT: [place], AT Cardiovascular: No clubbing, cyanosis, or edema Respiratory: Normal respiratory effort, no increased work of breathing Skin: No rashes, bruises or suspicious lesions Neurologic: Grossly intact, no focal deficits, moving all 4 extremities Psychiatric:  Normal mood and affect  Pertinent Imaging: KUB, 10/14/2022: See Epic  I personally reviewed the images referenced above and note stable right lower pole stone with 2 adjacent punctate densities representing new renal stones vs bowel contents.  Assessment & Plan:   1. History of nephrolithiasis Stone burden overall rather stable and she remains asymptomatic. She prefers to keep annual follow up. Will see her back next year with KUB prior, sooner if needed. - DG Abd 1 View; Future  Return in about 1 year (around 10/14/2023) for Annual stone visit with KUB prior.  Carman Ching, PA-C  [facility] [place] [address], [address] [place], [place] [address] [phone]

## 2022-10-24 IMAGING — MG MM DIGITAL DIAGNOSTIC UNILAT*L* W/ TOMO W/ CAD
4 series · 4 of 8 positions shown · non-contrast
Comparison: Previous exam(s).

CLINICAL DATA: Callback for LEFT breast calcifications

EXAM:
DIGITAL DIAGNOSTIC UNILATERAL LEFT MAMMOGRAM WITH TOMOSYNTHESIS AND
CAD
TECHNIQUE: Left digital diagnostic mammography and breast tomosynthesis was
performed. The images were evaluated with computer-aided detection.

[L ML]
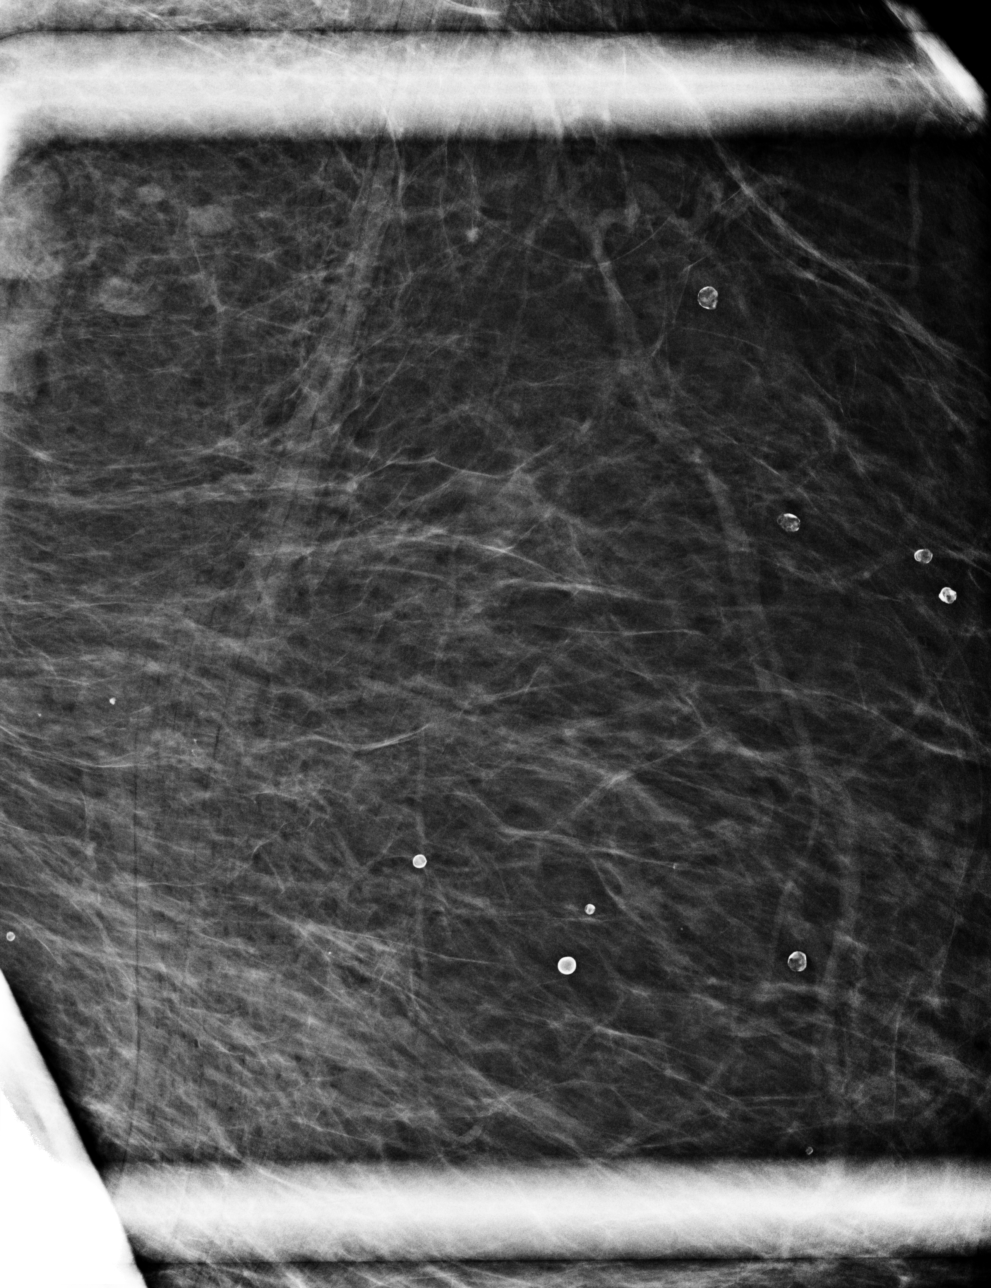

[L CC]
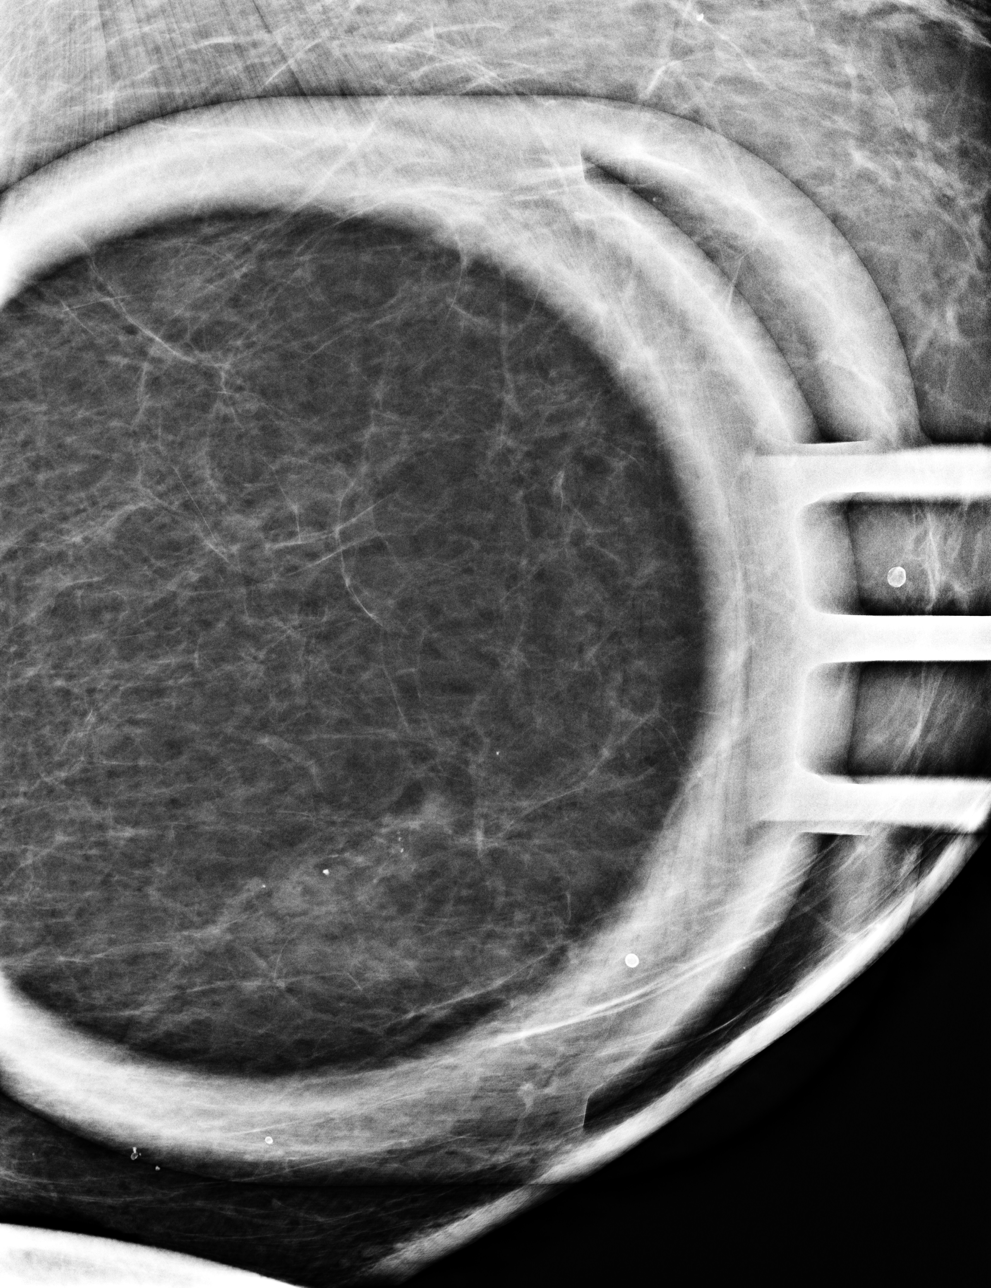

[L ML synth-2D]
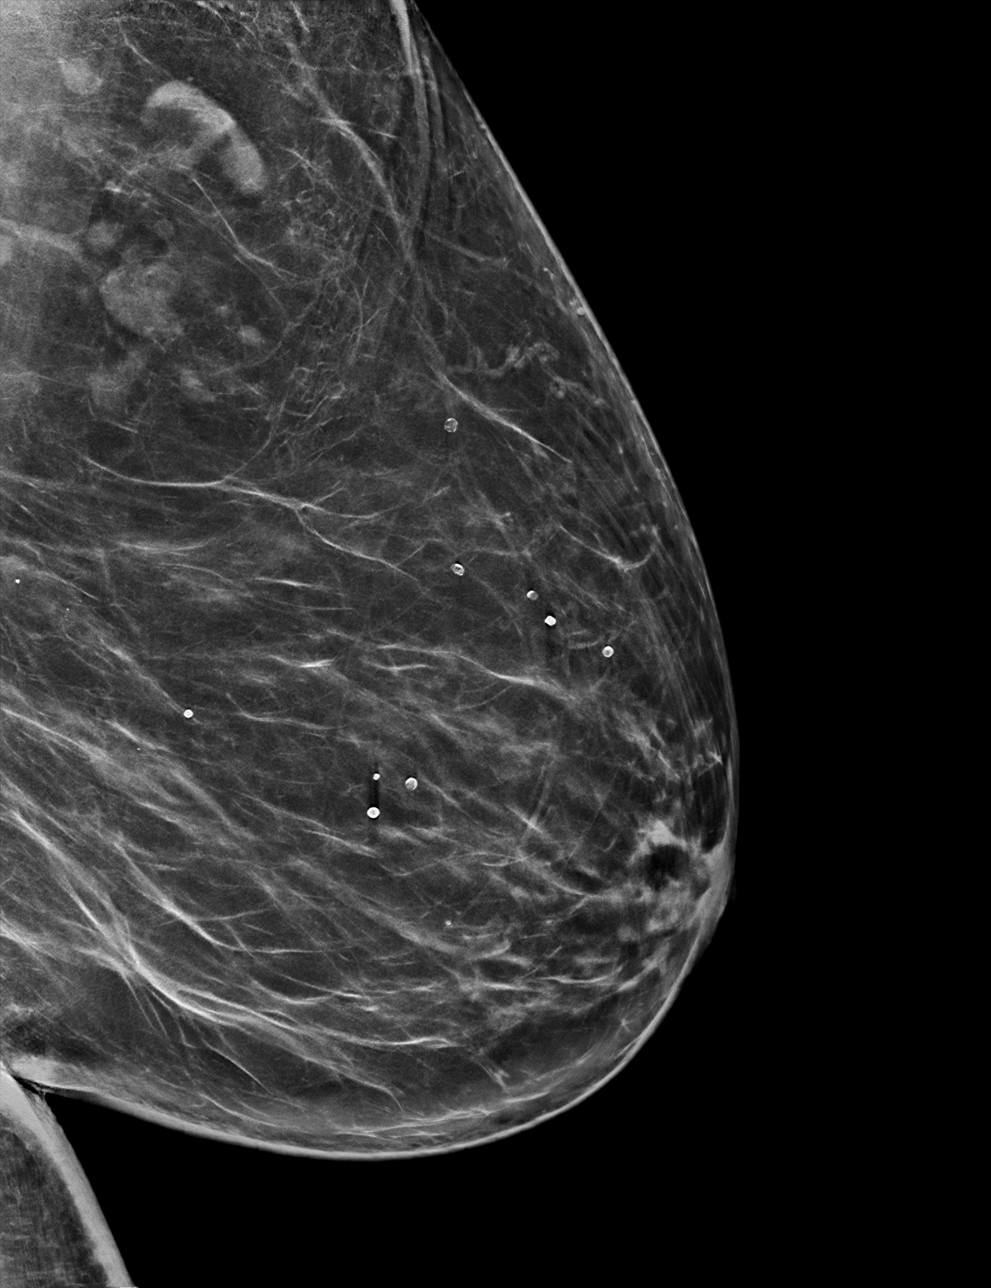

[L ML tomo · tomo slice 39/78.0]
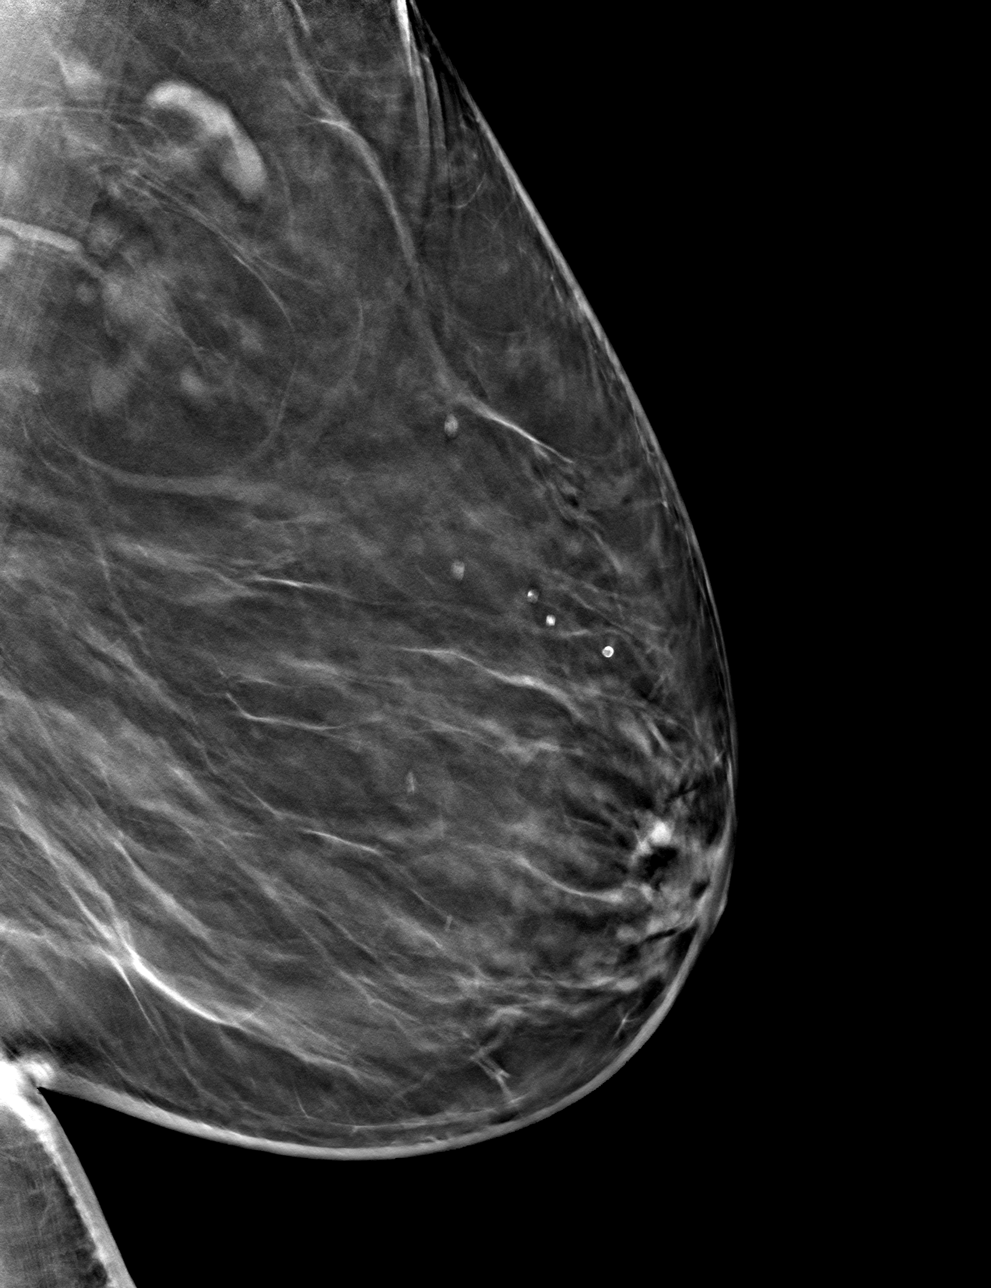

[4 of 8 positions shown; findings below may reference images not displayed]

ACR Breast Density Category b: There are scattered areas of
fibroglandular density.
FINDINGS: Spot magnification views of the LEFT breast demonstrate a loosely
grouped 3 mm area of punctate calcifications in the LEFT inner and
slightly upper breast at posterior depth. These are not definitively
stable in comparison to remote prior mammograms. There are scattered
dystrophic calcifications in the LEFT breast.
IMPRESSION: A 3 mm group of punctate calcifications in the LEFT inner and
slightly upper breast posterior depth are indeterminate. Recommend
stereotactic guided biopsy for definitive characterization.

RECOMMENDATION:
LEFT breast stereotactic guided biopsy x1

I have discussed the findings and recommendations with the patient.
The biopsy procedure was discussed with the patient and questions
were answered. Patient expressed their understanding of the biopsy
recommendation. Patient will be scheduled for biopsy at her earliest
convenience by the schedulers. Ordering provider will be notified.
If applicable, a reminder letter will be sent to the patient
regarding the next appointment.

BI-RADS CATEGORY  4: Suspicious.

## 2022-10-26 ENCOUNTER — Ambulatory Visit
Admission: RE | Admit: 2022-10-26 | Discharge: 2022-10-26 | Disposition: A | Discharge status: Home / Self Care | Payer: 59 | Attending: Physician Assistant | Admitting: Physician Assistant

## 2022-10-26 ENCOUNTER — Ambulatory Visit
Admission: RE | Admit: 2022-10-26 | Discharge: 2022-10-26 | Disposition: A | Discharge status: Home / Self Care | Payer: 59 | Source: Ambulatory Visit | Attending: Physician Assistant | Admitting: Physician Assistant

## 2022-10-26 ENCOUNTER — Other Ambulatory Visit: Payer: Self-pay | Admitting: Physician Assistant

## 2022-10-26 DIAGNOSIS — M79644 Pain in right finger(s): Secondary | ICD-10-CM | POA: Diagnosis not present

## 2022-10-26 DIAGNOSIS — M19041 Primary osteoarthritis, right hand: Secondary | ICD-10-CM | POA: Diagnosis not present

## 2022-10-26 DIAGNOSIS — L819 Disorder of pigmentation, unspecified: Secondary | ICD-10-CM | POA: Diagnosis not present

## 2022-10-26 DIAGNOSIS — Z0130 Encounter for examination of blood pressure without abnormal findings: Secondary | ICD-10-CM | POA: Diagnosis not present

## 2022-10-26 DIAGNOSIS — Z712 Person consulting for explanation of examination or test findings: Secondary | ICD-10-CM | POA: Diagnosis not present

## 2022-10-26 DIAGNOSIS — M7989 Other specified soft tissue disorders: Secondary | ICD-10-CM | POA: Diagnosis not present

## 2022-10-26 DIAGNOSIS — Z1389 Encounter for screening for other disorder: Secondary | ICD-10-CM | POA: Diagnosis not present

## 2022-12-22 ENCOUNTER — Ambulatory Visit: Payer: 59 | Admitting: Dermatology

## 2023-01-10 DIAGNOSIS — Z23 Encounter for immunization: Secondary | ICD-10-CM | POA: Diagnosis not present

## 2023-01-25 DIAGNOSIS — Z23 Encounter for immunization: Secondary | ICD-10-CM | POA: Diagnosis not present

## 2023-02-28 ENCOUNTER — Other Ambulatory Visit: Payer: Self-pay | Admitting: Nurse Practitioner

## 2023-02-28 DIAGNOSIS — Z1231 Encounter for screening mammogram for malignant neoplasm of breast: Secondary | ICD-10-CM

## 2023-03-27 ENCOUNTER — Ambulatory Visit
Admission: RE | Admit: 2023-03-27 | Discharge: 2023-03-27 | Disposition: A | Discharge status: Home / Self Care | Payer: 59 | Source: Ambulatory Visit | Attending: Nurse Practitioner | Admitting: Nurse Practitioner

## 2023-03-27 DIAGNOSIS — Z1231 Encounter for screening mammogram for malignant neoplasm of breast: Secondary | ICD-10-CM | POA: Diagnosis not present

## 2023-04-11 DIAGNOSIS — Z0000 Encounter for general adult medical examination without abnormal findings: Secondary | ICD-10-CM | POA: Diagnosis not present

## 2023-04-11 DIAGNOSIS — Z0130 Encounter for examination of blood pressure without abnormal findings: Secondary | ICD-10-CM | POA: Diagnosis not present

## 2023-04-11 DIAGNOSIS — E119 Type 2 diabetes mellitus without complications: Secondary | ICD-10-CM | POA: Diagnosis not present

## 2023-04-11 DIAGNOSIS — E0500 Thyrotoxicosis with diffuse goiter without thyrotoxic crisis or storm: Secondary | ICD-10-CM | POA: Diagnosis not present

## 2023-04-11 DIAGNOSIS — Z0131 Encounter for examination of blood pressure with abnormal findings: Secondary | ICD-10-CM | POA: Diagnosis not present

## 2023-04-11 DIAGNOSIS — Z1389 Encounter for screening for other disorder: Secondary | ICD-10-CM | POA: Diagnosis not present

## 2023-04-12 DIAGNOSIS — E119 Type 2 diabetes mellitus without complications: Secondary | ICD-10-CM | POA: Diagnosis not present

## 2023-06-01 DIAGNOSIS — E119 Type 2 diabetes mellitus without complications: Secondary | ICD-10-CM | POA: Diagnosis not present

## 2023-06-05 DIAGNOSIS — E0500 Thyrotoxicosis with diffuse goiter without thyrotoxic crisis or storm: Secondary | ICD-10-CM | POA: Diagnosis not present

## 2023-06-05 DIAGNOSIS — E042 Nontoxic multinodular goiter: Secondary | ICD-10-CM | POA: Diagnosis not present

## 2023-06-07 ENCOUNTER — Other Ambulatory Visit: Payer: Self-pay | Admitting: Internal Medicine

## 2023-06-07 DIAGNOSIS — E0500 Thyrotoxicosis with diffuse goiter without thyrotoxic crisis or storm: Secondary | ICD-10-CM

## 2023-06-26 ENCOUNTER — Encounter
Admission: RE | Admit: 2023-06-26 | Discharge: 2023-06-26 | Disposition: A | Discharge status: Home / Self Care | Source: Ambulatory Visit | Attending: Internal Medicine | Admitting: Internal Medicine

## 2023-06-26 DIAGNOSIS — E0500 Thyrotoxicosis with diffuse goiter without thyrotoxic crisis or storm: Secondary | ICD-10-CM | POA: Insufficient documentation

## 2023-06-26 MED ORDER — SODIUM IODIDE I-123 7.4 MBQ CAPS
441.0000 | ORAL_CAPSULE | Freq: Once | ORAL | Status: AC
  Administered 2023-06-26: 441 via ORAL

## 2023-06-27 ENCOUNTER — Encounter
Admission: RE | Admit: 2023-06-27 | Discharge: 2023-06-27 | Disposition: A | Discharge status: Home / Self Care | Source: Ambulatory Visit | Attending: Internal Medicine | Admitting: Internal Medicine

## 2023-06-27 DIAGNOSIS — E0500 Thyrotoxicosis with diffuse goiter without thyrotoxic crisis or storm: Secondary | ICD-10-CM | POA: Diagnosis not present

## 2023-06-27 DIAGNOSIS — E049 Nontoxic goiter, unspecified: Secondary | ICD-10-CM | POA: Diagnosis not present

## 2023-06-29 ENCOUNTER — Other Ambulatory Visit: Payer: Self-pay | Admitting: Internal Medicine

## 2023-06-29 DIAGNOSIS — E0500 Thyrotoxicosis with diffuse goiter without thyrotoxic crisis or storm: Secondary | ICD-10-CM

## 2023-06-29 DIAGNOSIS — E042 Nontoxic multinodular goiter: Secondary | ICD-10-CM

## 2023-07-03 NOTE — Written Directive (Cosign Needed)
 MOLECULAR IMAGING AND THERAPEUTICS WRITTEN DIRECTIVE   PATIENT NAME: Sarah [name]  PT DOB:   December 18, 1955                                              MRN: [id number]  ---------------------------------------------------------------------------------------------------------------------   I-131 WHOLE THYROID THERAPY (NON-CANCER)    RADIOPHARMACEUTICAL:   Iodine-131 Capsule       Sched: 07/11/23 @1000     PRESCRIBED DOSE FOR ADMINISTRATION: 16  mCi   ROUTE OFADMINISTRATION: PO   DIAGNOSIS:  Graves Disease   REFERRING PHYSICIAN:  Raelene Bott, MD   TSH:    Lab Results  Component Value Date   TSH <0.005 (L) 08/14/2019   TSH 0.013 (L) 05/08/2019   TSH 1.010 01/23/2019     PRIOR I-131 THERAPY (Date and Dose):   PRIOR RADIOLOGY EXAMS (Results and Date): NM THYROID MULT UPTAKE W/IMAGING Result Date: 06/27/2023 CLINICAL DATA:  Graves disease EXAM: THYROID SCAN AND UPTAKE - 4 AND 24 HOURS TECHNIQUE: Following oral administration of I-123 capsule, anterior planar imaging was acquired at 24 hours. Thyroid uptake was calculated with a thyroid probe at 4-6 hours and 24 hours. RADIOPHARMACEUTICALS:  441.0 uCi I-123 sodium iodide p.o. COMPARISON:  None Available. FINDINGS: Scintigraphic images demonstrates asymmetric enlargement of the right thyroid lobe. There is intense focal uptake radiotracer within the left thyroid lobe suggestive of an autonomy hyperfunctioning nodule. There is background uptake within the remainder of the gland in keeping with incomplete suppression. However, there is suggestion of a large cold nodule within the enlarged right thyroid lobe best appreciated on RAO imaging which may comprise the majority of the thyroid lobe. 4 hour I-123 uptake = 59.1% (normal 5-20%) 24 hour I-123 uptake = 61.7% (normal 10-30%) IMPRESSION: Markedly elevated iodine uptake and organification. Asymmetric enlargement of the right thyroid lobe with possible large cold nodule.  Correlation with thyroid scintigraphy is recommended further evaluation. Electronically Signed   By: Helyn Numbers M.D.   On: 06/27/2023 22:51      ADDITIONAL PHYSICIAN COMMENTS/NOTES Graves disease Multinodular goiter Trial of methimazole    AUTHORIZED USER SIGNATURE & TIME STAMP: Patriciaann Clan, MD   07/06/23    10:20 AM

## 2023-07-11 ENCOUNTER — Encounter
Admission: RE | Admit: 2023-07-11 | Discharge: 2023-07-11 | Disposition: A | Discharge status: Home / Self Care | Source: Ambulatory Visit | Attending: Internal Medicine | Admitting: Internal Medicine

## 2023-07-11 DIAGNOSIS — E0500 Thyrotoxicosis with diffuse goiter without thyrotoxic crisis or storm: Secondary | ICD-10-CM | POA: Diagnosis not present

## 2023-07-11 DIAGNOSIS — E042 Nontoxic multinodular goiter: Secondary | ICD-10-CM | POA: Diagnosis not present

## 2023-07-11 MED ORDER — SODIUM IODIDE I 131 CAPSULE
16.2000 | Freq: Once | INTRAVENOUS | Status: AC | PRN
  Administered 2023-07-11: 16.2 via ORAL

## 2023-07-20 DIAGNOSIS — E119 Type 2 diabetes mellitus without complications: Secondary | ICD-10-CM | POA: Diagnosis not present

## 2023-07-24 DIAGNOSIS — H40003 Preglaucoma, unspecified, bilateral: Secondary | ICD-10-CM | POA: Diagnosis not present

## 2023-07-24 DIAGNOSIS — E113291 Type 2 diabetes mellitus with mild nonproliferative diabetic retinopathy without macular edema, right eye: Secondary | ICD-10-CM | POA: Diagnosis not present

## 2023-07-24 DIAGNOSIS — E119 Type 2 diabetes mellitus without complications: Secondary | ICD-10-CM | POA: Diagnosis not present

## 2023-07-24 DIAGNOSIS — H2513 Age-related nuclear cataract, bilateral: Secondary | ICD-10-CM | POA: Diagnosis not present

## 2023-08-18 DIAGNOSIS — E0500 Thyrotoxicosis with diffuse goiter without thyrotoxic crisis or storm: Secondary | ICD-10-CM | POA: Diagnosis not present

## 2023-08-18 DIAGNOSIS — E042 Nontoxic multinodular goiter: Secondary | ICD-10-CM | POA: Diagnosis not present

## 2023-08-25 DIAGNOSIS — E0500 Thyrotoxicosis with diffuse goiter without thyrotoxic crisis or storm: Secondary | ICD-10-CM | POA: Diagnosis not present

## 2023-08-25 DIAGNOSIS — E042 Nontoxic multinodular goiter: Secondary | ICD-10-CM | POA: Diagnosis not present

## 2023-09-08 DIAGNOSIS — Z0130 Encounter for examination of blood pressure without abnormal findings: Secondary | ICD-10-CM | POA: Diagnosis not present

## 2023-09-08 DIAGNOSIS — Z0131 Encounter for examination of blood pressure with abnormal findings: Secondary | ICD-10-CM | POA: Diagnosis not present

## 2023-09-08 DIAGNOSIS — Z712 Person consulting for explanation of examination or test findings: Secondary | ICD-10-CM | POA: Diagnosis not present

## 2023-09-08 DIAGNOSIS — Z0000 Encounter for general adult medical examination without abnormal findings: Secondary | ICD-10-CM | POA: Diagnosis not present

## 2023-09-08 DIAGNOSIS — Z1389 Encounter for screening for other disorder: Secondary | ICD-10-CM | POA: Diagnosis not present

## 2023-09-12 ENCOUNTER — Other Ambulatory Visit: Payer: Self-pay | Admitting: Nurse Practitioner

## 2023-09-12 DIAGNOSIS — Z780 Asymptomatic menopausal state: Secondary | ICD-10-CM

## 2023-09-19 DIAGNOSIS — Z1211 Encounter for screening for malignant neoplasm of colon: Secondary | ICD-10-CM | POA: Diagnosis not present

## 2023-10-16 ENCOUNTER — Ambulatory Visit (INDEPENDENT_AMBULATORY_CARE_PROVIDER_SITE_OTHER): Payer: Self-pay | Admitting: Physician Assistant

## 2023-10-16 ENCOUNTER — Ambulatory Visit: Admission: RE | Admit: 2023-10-16 | Source: Ambulatory Visit

## 2023-10-16 ENCOUNTER — Encounter: Payer: Self-pay | Admitting: Physician Assistant

## 2023-10-16 ENCOUNTER — Other Ambulatory Visit: Payer: Self-pay | Admitting: Physician Assistant

## 2023-10-16 ENCOUNTER — Ambulatory Visit
Admission: RE | Admit: 2023-10-16 | Discharge: 2023-10-16 | Disposition: A | Discharge status: Home / Self Care | Source: Ambulatory Visit | Attending: Physician Assistant | Admitting: Physician Assistant

## 2023-10-16 ENCOUNTER — Ambulatory Visit
Admission: RE | Admit: 2023-10-16 | Discharge: 2023-10-16 | Disposition: A | Discharge status: Home / Self Care | Attending: Physician Assistant | Admitting: Physician Assistant

## 2023-10-16 VITALS — BP 131/75 | HR 83 | Ht 64.5 in | Wt 160.0 lb

## 2023-10-16 DIAGNOSIS — R351 Nocturia: Secondary | ICD-10-CM

## 2023-10-16 DIAGNOSIS — Z87442 Personal history of urinary calculi: Secondary | ICD-10-CM | POA: Diagnosis not present

## 2023-10-16 NOTE — Progress Notes (Signed)
 10/16/2023 11:02 AM   Sarah [name] 07-27-1955 [id number]  CC: Chief Complaint  Patient presents with   Follow-up   HPI: Sarah [name] is a 68 y.o. female with PMH diabetes on Jardiance and nephrolithiasis who presents today for annual stone follow-up.   Today she reports no flank pain, gross hematuria, stones passed, or UTIs in the past year.  She has had some intermittent nocturia x 2-3 recently, but it is not particularly bothersome.  She attributes this to pushing fluids.  KUB today with a stable 4 mm right renal stone.  PMH: Past Medical History:  Diagnosis Date   Arthritis    Diabetes mellitus without complication (HCC)    Hyperlipidemia    Hypertension    Hyperthyroidism    Thyroid  disease     Surgical History: Past Surgical History:  Procedure Laterality Date   ABDOMINAL HYSTERECTOMY     AMPUTATION Left 12/15/2017   Procedure: AMPUTATION DIGIT left long finger;  Surgeon: Mardee Lynwood SQUIBB, MD;  Location: [facility];  Service: Orthopedics;  Laterality: Left;   BREAST BIOPSY Left 04/13/2021   Stereo Bx, Ribbon Clip-FIBROADENOMA WITH ASSOCIATED CALCIFICATIONS. - NEGATIVE FOR ATYPIA AND MALIGNANCY.   COLONOSCOPY WITH PROPOFOL  N/A 04/28/2022   Procedure: COLONOSCOPY WITH PROPOFOL ;  Surgeon: Therisa Bi, MD;  Location: [facility];  Service: Gastroenterology;  Laterality: N/A;   EXTRACORPOREAL SHOCK WAVE LITHOTRIPSY Right 08/13/2020   Procedure: EXTRACORPOREAL SHOCK WAVE LITHOTRIPSY (ESWL);  Surgeon: Penne Knee, MD;  Location: [facility];  Service: Urology;  Laterality: Right;   MM BREAST STEREO BIOPSY LEFT ([facility] HX) Left 04/13/2021   calcs, ribbon clip, path pending.   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 If you have any questions, ask your nurse or doctor.          amLODipine  5 MG tablet Commonly known as: NORVASC  Take 1 tablet (5 mg total) by mouth daily.   atorvastatin  10 MG tablet Commonly known as: LIPITOR Take 1 tablet (10 mg total) by mouth daily.   empagliflozin 10 MG Tabs tablet Commonly known as: JARDIANCE Take 10 mg by mouth daily.   ibuprofen  200 MG tablet Commonly known as: ADVIL  Take 400 mg by mouth every 6 (six) hours as needed.   losartan  25 MG tablet Commonly known as: COZAAR  Take 25 mg by mouth daily.   metFORMIN  1000 MG tablet Commonly known as: GLUCOPHAGE  Take 1 tablet by mouth 2 (two) times daily.   methimazole  5 MG tablet Commonly known as: TAPAZOLE  Take 10 mg by mouth daily.        Allergies:  Allergies  Allergen Reactions   Ace Inhibitors Nausea Only   Farxiga  [Dapagliflozin ] Other (See Comments)    Vaginitis     Family History: Family History  Problem Relation Age of Onset   Diabetes Mother    Diabetes Sister    Stroke Sister    Breast cancer Neg Hx     Social History:   reports that she quit smoking about 4  years ago. Her smoking use included cigarettes. She has been exposed to tobacco smoke. She has never used smokeless tobacco. She reports that she does not drink alcohol and does not use drugs.  Physical Exam: BP 131/75   Pulse 83   Ht 5' 4.5 (1.638 m)   Wt 160 lb (72.6 kg)   BMI 27.04 kg/m   Constitutional:  Alert and oriented, no acute distress, nontoxic appearing HEENT: [place], AT Cardiovascular: No clubbing, cyanosis, or edema Respiratory: Normal respiratory effort, no increased work of breathing Skin: No rashes, bruises or suspicious lesions Neurologic: Grossly intact, no focal deficits, moving all 4 extremities Psychiatric: Normal mood and  affect  Pertinent Imaging: KUB, 10/16/2023: See epic  I personally reviewed the images referenced above and note a stable 4 mm right renal stone.  Assessment & Plan:   1. History of nephrolithiasis (Primary) Asymptomatic.  Stable right renal stone on KUB today.  She prefers to keep annual follow-up, which is reasonable.  2. Nocturia New nocturia, not bothersome.  Will continue to monitor.  May consider PN OAB meds versus sleep study in the future if this worsens.  Return in about 1 year (around 10/15/2024) for Annual stone visit with KUB prior.  [name], PA-C  [facility] [place] [address] [place], [place] [address] [phone]

## 2023-11-16 ENCOUNTER — Ambulatory Visit
Admission: RE | Admit: 2023-11-16 | Discharge: 2023-11-16 | Disposition: A | Discharge status: Home / Self Care | Source: Ambulatory Visit | Attending: Nurse Practitioner | Admitting: Nurse Practitioner

## 2023-11-16 ENCOUNTER — Ambulatory Visit: Payer: 59 | Admitting: Dermatology

## 2023-11-16 DIAGNOSIS — Z780 Asymptomatic menopausal state: Secondary | ICD-10-CM | POA: Insufficient documentation

## 2024-02-21 ENCOUNTER — Other Ambulatory Visit: Payer: Self-pay | Admitting: Nurse Practitioner

## 2024-02-21 DIAGNOSIS — Z1231 Encounter for screening mammogram for malignant neoplasm of breast: Secondary | ICD-10-CM

## 2024-04-04 ENCOUNTER — Ambulatory Visit
Admission: RE | Admit: 2024-04-04 | Discharge: 2024-04-04 | Disposition: A | Discharge status: Home / Self Care | Source: Ambulatory Visit | Attending: Nurse Practitioner | Admitting: Nurse Practitioner

## 2024-04-04 DIAGNOSIS — Z1231 Encounter for screening mammogram for malignant neoplasm of breast: Secondary | ICD-10-CM | POA: Diagnosis present

## 2024-10-15 ENCOUNTER — Ambulatory Visit: Admitting: Physician Assistant
